# Patient Record
Sex: Female | Born: 1980 | Race: Black or African American | Hispanic: No | Smoking: Current every day smoker
Health system: Southern US, Community
[De-identification: ages and names within clinical notes are randomized; demographics above are authoritative.]

## PROBLEM LIST (undated history)

## (undated) ENCOUNTER — Inpatient Hospital Stay (HOSPITAL_COMMUNITY): Payer: Self-pay

## (undated) DIAGNOSIS — K219 Gastro-esophageal reflux disease without esophagitis: Secondary | ICD-10-CM

## (undated) DIAGNOSIS — I499 Cardiac arrhythmia, unspecified: Secondary | ICD-10-CM

## (undated) DIAGNOSIS — R51 Headache: Secondary | ICD-10-CM

## (undated) DIAGNOSIS — I10 Essential (primary) hypertension: Secondary | ICD-10-CM

## (undated) DIAGNOSIS — J45909 Unspecified asthma, uncomplicated: Secondary | ICD-10-CM

## (undated) DIAGNOSIS — F1120 Opioid dependence, uncomplicated: Secondary | ICD-10-CM

## (undated) HISTORY — PX: DILATION AND CURETTAGE OF UTERUS: SHX78

## (undated) HISTORY — DX: Opioid dependence, uncomplicated: F11.20

## (undated) HISTORY — PX: DENTAL SURGERY: SHX609

## (undated) HISTORY — PX: LAPAROSCOPY: SHX197

---
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
## 2002-03-15 ENCOUNTER — Other Ambulatory Visit: Admission: RE | Admit: 2002-03-15 | Discharge: 2002-03-15 | Payer: Self-pay | Admitting: Obstetrics and Gynecology

## 2002-04-19 ENCOUNTER — Emergency Department (HOSPITAL_COMMUNITY): Admission: EM | Admit: 2002-04-19 | Discharge: 2002-04-19 | Payer: Self-pay | Admitting: Emergency Medicine

## 2002-04-30 ENCOUNTER — Emergency Department (HOSPITAL_COMMUNITY): Admission: EM | Admit: 2002-04-30 | Discharge: 2002-04-30 | Payer: Self-pay | Admitting: Emergency Medicine

## 2002-08-09 ENCOUNTER — Inpatient Hospital Stay (HOSPITAL_COMMUNITY): Admission: AD | Admit: 2002-08-09 | Discharge: 2002-08-09 | Payer: Self-pay | Admitting: Obstetrics and Gynecology

## 2002-10-17 ENCOUNTER — Encounter: Payer: Self-pay | Admitting: Obstetrics and Gynecology

## 2002-10-17 ENCOUNTER — Ambulatory Visit (HOSPITAL_COMMUNITY): Admission: RE | Admit: 2002-10-17 | Discharge: 2002-10-17 | Payer: Self-pay | Admitting: Obstetrics and Gynecology

## 2002-11-14 ENCOUNTER — Encounter: Payer: Self-pay | Admitting: Obstetrics and Gynecology

## 2002-11-14 ENCOUNTER — Ambulatory Visit (HOSPITAL_COMMUNITY): Admission: RE | Admit: 2002-11-14 | Discharge: 2002-11-14 | Payer: Self-pay | Admitting: Obstetrics and Gynecology

## 2002-11-28 ENCOUNTER — Emergency Department (HOSPITAL_COMMUNITY): Admission: EM | Admit: 2002-11-28 | Discharge: 2002-11-28 | Payer: Self-pay | Admitting: Emergency Medicine

## 2003-03-07 ENCOUNTER — Inpatient Hospital Stay (HOSPITAL_COMMUNITY): Admission: AD | Admit: 2003-03-07 | Discharge: 2003-03-11 | Payer: Self-pay | Admitting: *Deleted

## 2003-03-08 ENCOUNTER — Encounter (INDEPENDENT_AMBULATORY_CARE_PROVIDER_SITE_OTHER): Payer: Self-pay

## 2003-04-18 ENCOUNTER — Other Ambulatory Visit: Admission: RE | Admit: 2003-04-18 | Discharge: 2003-04-18 | Payer: Self-pay | Admitting: Obstetrics and Gynecology

## 2003-09-14 ENCOUNTER — Inpatient Hospital Stay (HOSPITAL_COMMUNITY): Admission: AD | Admit: 2003-09-14 | Discharge: 2003-09-14 | Payer: Self-pay | Admitting: Obstetrics and Gynecology

## 2004-01-09 ENCOUNTER — Emergency Department (HOSPITAL_COMMUNITY): Admission: EM | Admit: 2004-01-09 | Discharge: 2004-01-09 | Payer: Self-pay | Admitting: Emergency Medicine

## 2004-01-15 ENCOUNTER — Emergency Department (HOSPITAL_COMMUNITY): Admission: EM | Admit: 2004-01-15 | Discharge: 2004-01-15 | Payer: Self-pay | Admitting: *Deleted

## 2004-10-07 ENCOUNTER — Emergency Department (HOSPITAL_COMMUNITY): Admission: EM | Admit: 2004-10-07 | Discharge: 2004-10-08 | Payer: Self-pay | Admitting: Emergency Medicine

## 2005-04-07 ENCOUNTER — Emergency Department (HOSPITAL_COMMUNITY): Admission: EM | Admit: 2005-04-07 | Discharge: 2005-04-07 | Payer: Self-pay | Admitting: Emergency Medicine

## 2005-06-25 ENCOUNTER — Ambulatory Visit: Payer: Self-pay | Admitting: *Deleted

## 2005-12-10 ENCOUNTER — Emergency Department (HOSPITAL_COMMUNITY): Admission: EM | Admit: 2005-12-10 | Discharge: 2005-12-10 | Payer: Self-pay | Admitting: *Deleted

## 2006-05-01 ENCOUNTER — Inpatient Hospital Stay (HOSPITAL_COMMUNITY): Admission: AD | Admit: 2006-05-01 | Discharge: 2006-05-01 | Payer: Self-pay | Admitting: Obstetrics & Gynecology

## 2006-08-29 ENCOUNTER — Inpatient Hospital Stay (HOSPITAL_COMMUNITY): Admission: AD | Admit: 2006-08-29 | Discharge: 2006-08-29 | Payer: Self-pay | Admitting: Obstetrics & Gynecology

## 2007-05-04 ENCOUNTER — Emergency Department (HOSPITAL_COMMUNITY): Admission: EM | Admit: 2007-05-04 | Discharge: 2007-05-04 | Payer: Self-pay | Admitting: Emergency Medicine

## 2007-10-13 ENCOUNTER — Encounter: Payer: Self-pay | Admitting: Obstetrics & Gynecology

## 2007-10-13 ENCOUNTER — Ambulatory Visit (HOSPITAL_COMMUNITY): Admission: RE | Admit: 2007-10-13 | Discharge: 2007-10-13 | Payer: Self-pay | Admitting: Obstetrics & Gynecology

## 2008-03-22 ENCOUNTER — Encounter: Admission: RE | Admit: 2008-03-22 | Discharge: 2008-03-22 | Payer: Self-pay | Admitting: Obstetrics & Gynecology

## 2008-09-06 ENCOUNTER — Emergency Department (HOSPITAL_COMMUNITY): Admission: EM | Admit: 2008-09-06 | Discharge: 2008-09-06 | Payer: Self-pay | Admitting: Emergency Medicine

## 2009-02-06 ENCOUNTER — Inpatient Hospital Stay (HOSPITAL_COMMUNITY): Admission: AD | Admit: 2009-02-06 | Discharge: 2009-02-06 | Payer: Self-pay | Admitting: Psychiatry

## 2009-05-02 ENCOUNTER — Emergency Department (HOSPITAL_COMMUNITY): Admission: EM | Admit: 2009-05-02 | Discharge: 2009-05-02 | Payer: Self-pay | Admitting: Emergency Medicine

## 2009-05-31 ENCOUNTER — Emergency Department (HOSPITAL_COMMUNITY): Admission: EM | Admit: 2009-05-31 | Discharge: 2009-05-31 | Payer: Self-pay | Admitting: Emergency Medicine

## 2009-10-11 ENCOUNTER — Ambulatory Visit (HOSPITAL_COMMUNITY): Admission: RE | Admit: 2009-10-11 | Discharge: 2009-10-11 | Payer: Self-pay | Admitting: Obstetrics & Gynecology

## 2009-10-25 ENCOUNTER — Ambulatory Visit (HOSPITAL_COMMUNITY): Admission: RE | Admit: 2009-10-25 | Discharge: 2009-10-25 | Payer: Self-pay | Admitting: Obstetrics & Gynecology

## 2009-11-02 ENCOUNTER — Emergency Department (HOSPITAL_COMMUNITY): Admission: EM | Admit: 2009-11-02 | Discharge: 2009-11-03 | Payer: Self-pay | Admitting: Emergency Medicine

## 2009-11-05 ENCOUNTER — Emergency Department (HOSPITAL_COMMUNITY): Admission: EM | Admit: 2009-11-05 | Discharge: 2009-11-05 | Payer: Self-pay | Admitting: Emergency Medicine

## 2009-11-14 ENCOUNTER — Inpatient Hospital Stay (HOSPITAL_COMMUNITY): Admission: AD | Admit: 2009-11-14 | Discharge: 2009-11-15 | Payer: Self-pay | Admitting: Obstetrics

## 2009-12-21 ENCOUNTER — Inpatient Hospital Stay (HOSPITAL_COMMUNITY): Admission: EM | Admit: 2009-12-21 | Discharge: 2009-12-23 | Payer: Self-pay | Admitting: Emergency Medicine

## 2009-12-23 ENCOUNTER — Encounter (INDEPENDENT_AMBULATORY_CARE_PROVIDER_SITE_OTHER): Payer: Self-pay | Admitting: Internal Medicine

## 2010-09-28 ENCOUNTER — Encounter: Payer: Self-pay | Admitting: Obstetrics and Gynecology

## 2010-10-19 ENCOUNTER — Emergency Department (HOSPITAL_COMMUNITY)
Admission: EM | Admit: 2010-10-19 | Discharge: 2010-10-19 | Disposition: A | Discharge status: Home / Self Care | Payer: Medicaid Other | Attending: Emergency Medicine | Admitting: Emergency Medicine

## 2010-10-19 DIAGNOSIS — I10 Essential (primary) hypertension: Secondary | ICD-10-CM | POA: Insufficient documentation

## 2010-10-19 DIAGNOSIS — J3489 Other specified disorders of nose and nasal sinuses: Secondary | ICD-10-CM | POA: Insufficient documentation

## 2010-10-19 DIAGNOSIS — IMO0001 Reserved for inherently not codable concepts without codable children: Secondary | ICD-10-CM | POA: Insufficient documentation

## 2010-10-19 DIAGNOSIS — R05 Cough: Secondary | ICD-10-CM | POA: Insufficient documentation

## 2010-10-19 DIAGNOSIS — R059 Cough, unspecified: Secondary | ICD-10-CM | POA: Insufficient documentation

## 2010-10-19 DIAGNOSIS — J45909 Unspecified asthma, uncomplicated: Secondary | ICD-10-CM | POA: Insufficient documentation

## 2010-10-19 DIAGNOSIS — J069 Acute upper respiratory infection, unspecified: Secondary | ICD-10-CM | POA: Insufficient documentation

## 2010-10-19 DIAGNOSIS — J029 Acute pharyngitis, unspecified: Secondary | ICD-10-CM | POA: Insufficient documentation

## 2010-11-05 ENCOUNTER — Emergency Department (HOSPITAL_COMMUNITY)
Admission: EM | Admit: 2010-11-05 | Discharge: 2010-11-05 | Disposition: A | Discharge status: Home / Self Care | Payer: Medicaid Other | Attending: Emergency Medicine | Admitting: Emergency Medicine

## 2010-11-05 DIAGNOSIS — L02219 Cutaneous abscess of trunk, unspecified: Secondary | ICD-10-CM | POA: Insufficient documentation

## 2010-11-05 DIAGNOSIS — I10 Essential (primary) hypertension: Secondary | ICD-10-CM | POA: Insufficient documentation

## 2010-11-05 DIAGNOSIS — R51 Headache: Secondary | ICD-10-CM | POA: Insufficient documentation

## 2010-11-08 LAB — WOUND CULTURE: Gram Stain: NONE SEEN

## 2010-11-24 ENCOUNTER — Emergency Department (HOSPITAL_COMMUNITY): Payer: Medicaid Other

## 2010-11-24 ENCOUNTER — Emergency Department (HOSPITAL_COMMUNITY)
Admission: EM | Admit: 2010-11-24 | Discharge: 2010-11-24 | Disposition: A | Discharge status: Home / Self Care | Payer: Medicaid Other | Attending: Emergency Medicine | Admitting: Emergency Medicine

## 2010-11-24 DIAGNOSIS — I10 Essential (primary) hypertension: Secondary | ICD-10-CM | POA: Insufficient documentation

## 2010-11-24 DIAGNOSIS — R05 Cough: Secondary | ICD-10-CM | POA: Insufficient documentation

## 2010-11-24 DIAGNOSIS — R0602 Shortness of breath: Secondary | ICD-10-CM | POA: Insufficient documentation

## 2010-11-24 DIAGNOSIS — R059 Cough, unspecified: Secondary | ICD-10-CM | POA: Insufficient documentation

## 2010-11-24 DIAGNOSIS — R002 Palpitations: Secondary | ICD-10-CM | POA: Insufficient documentation

## 2010-11-24 DIAGNOSIS — T50901A Poisoning by unspecified drugs, medicaments and biological substances, accidental (unintentional), initial encounter: Secondary | ICD-10-CM | POA: Insufficient documentation

## 2010-11-24 LAB — URINALYSIS, ROUTINE W REFLEX MICROSCOPIC
Hgb urine dipstick: NEGATIVE
Ketones, ur: NEGATIVE mg/dL
Nitrite: NEGATIVE
Specific Gravity, Urine: 1.022 (ref 1.005–1.030)
pH: 7 (ref 5.0–8.0)

## 2010-11-24 LAB — DIFFERENTIAL
Basophils Relative: 0 % (ref 0–1)
Lymphs Abs: 2.5 10*3/uL (ref 0.7–4.0)
Monocytes Absolute: 0.6 10*3/uL (ref 0.1–1.0)
Monocytes Relative: 7 % (ref 3–12)
Neutro Abs: 5.4 10*3/uL (ref 1.7–7.7)
Neutrophils Relative %: 63 % (ref 43–77)

## 2010-11-24 LAB — CBC
HCT: 38.9 % (ref 36.0–46.0)
Hemoglobin: 12.8 g/dL (ref 12.0–15.0)
MCV: 92.6 fL (ref 78.0–100.0)
WBC: 8.6 10*3/uL (ref 4.0–10.5)

## 2010-11-24 LAB — BASIC METABOLIC PANEL
BUN: 14 mg/dL (ref 6–23)
CO2: 28 mEq/L (ref 19–32)
Chloride: 105 mEq/L (ref 96–112)
Creatinine, Ser: 1.03 mg/dL (ref 0.4–1.2)
GFR calc Af Amer: >60 mL/min (ref >60)
Sodium: 140 mEq/L (ref 135–145)

## 2010-11-25 LAB — RAPID URINE DRUG SCREEN, HOSP PERFORMED
Amphetamines: NOT DETECTED
Benzodiazepines: NOT DETECTED
Cocaine: NOT DETECTED
Opiates: NOT DETECTED
Tetrahydrocannabinol: NOT DETECTED

## 2010-11-25 LAB — CK TOTAL AND CKMB (NOT AT ARMC)
CK, MB: 1.4 ng/mL (ref 0.3–4.0)
Total CK: 194 U/L — ABNORMAL HIGH (ref 7–177)

## 2010-11-25 LAB — POCT I-STAT, CHEM 8
BUN: 11 mg/dL (ref 6–23)
Calcium, Ion: 1.16 mmol/L (ref 1.12–1.32)
Chloride: 107 mEq/L (ref 96–112)
Glucose, Bld: 92 mg/dL (ref 70–99)
Potassium: 4.8 mEq/L (ref 3.5–5.1)
Sodium: 141 mEq/L (ref 135–145)
TCO2: 27 mmol/L (ref 0–100)

## 2010-11-25 LAB — URINALYSIS, ROUTINE W REFLEX MICROSCOPIC
Glucose, UA: NEGATIVE mg/dL
Protein, ur: NEGATIVE mg/dL
pH: 6.5 (ref 5.0–8.0)

## 2010-11-25 LAB — CORTISOL-AM, BLOOD: Cortisol - AM: 3.9 ug/dL — ABNORMAL LOW (ref 4.3–22.4)

## 2010-11-25 LAB — BASIC METABOLIC PANEL
BUN: 11 mg/dL (ref 6–23)
BUN: 7 mg/dL (ref 6–23)
CO2: 28 mEq/L (ref 19–32)
Calcium: 10 mg/dL (ref 8.4–10.5)
Chloride: 106 mEq/L (ref 96–112)
Chloride: 108 mEq/L (ref 96–112)
Creatinine, Ser: 0.79 mg/dL (ref 0.4–1.2)
GFR calc Af Amer: >60 mL/min (ref >60)
GFR calc non Af Amer: >60 mL/min (ref >60)
Potassium: 3.4 mEq/L — ABNORMAL LOW (ref 3.5–5.1)

## 2010-11-25 LAB — CARDIAC PANEL(CRET KIN+CKTOT+MB+TROPI)
Relative Index: 0.7 (ref 0.0–2.5)
Relative Index: 0.8 (ref 0.0–2.5)
Troponin I: <0.01 ng/mL (ref 0.00–0.06)

## 2010-11-25 LAB — POCT CARDIAC MARKERS
Myoglobin, poc: 56.3 ng/mL (ref 12–200)
Myoglobin, poc: 77.1 ng/mL (ref 12–200)
Troponin i, poc: <0.05 ng/mL (ref 0.00–0.09)

## 2010-11-25 LAB — TSH
TSH: 0.333 u[IU]/mL — ABNORMAL LOW (ref 0.350–4.500)
TSH: 0.427 u[IU]/mL (ref 0.350–4.500)

## 2010-11-25 LAB — URINE CULTURE

## 2010-11-25 LAB — CBC
HCT: 38.4 % (ref 36.0–46.0)
MCHC: 33.5 g/dL (ref 30.0–36.0)
MCV: 95.2 fL (ref 78.0–100.0)
Platelets: 258 10*3/uL (ref 150–400)
WBC: 11.2 10*3/uL — ABNORMAL HIGH (ref 4.0–10.5)

## 2010-11-25 LAB — PROTIME-INR: Prothrombin Time: 13 seconds (ref 11.6–15.2)

## 2010-11-25 LAB — LIPID PANEL
Cholesterol: 148 mg/dL (ref 0–200)
LDL Cholesterol: 106 mg/dL — ABNORMAL HIGH (ref 0–99)

## 2010-11-25 LAB — HEMOGLOBIN A1C: Hgb A1c MFr Bld: 5.4 % (ref <5.7)

## 2010-11-26 ENCOUNTER — Inpatient Hospital Stay (INDEPENDENT_AMBULATORY_CARE_PROVIDER_SITE_OTHER)
Admission: RE | Admit: 2010-11-26 | Discharge: 2010-11-26 | Disposition: A | Discharge status: Home / Self Care | Payer: Medicaid Other | Source: Ambulatory Visit

## 2010-11-26 DIAGNOSIS — J45909 Unspecified asthma, uncomplicated: Secondary | ICD-10-CM

## 2010-11-26 DIAGNOSIS — R0989 Other specified symptoms and signs involving the circulatory and respiratory systems: Secondary | ICD-10-CM

## 2010-11-26 LAB — URINALYSIS, ROUTINE W REFLEX MICROSCOPIC
Glucose, UA: NEGATIVE mg/dL
Protein, ur: NEGATIVE mg/dL
Urobilinogen, UA: 1 mg/dL (ref 0.0–1.0)

## 2010-11-26 LAB — URINE MICROSCOPIC-ADD ON

## 2010-11-26 LAB — CBC
HCT: 40.5 % (ref 36.0–46.0)
Hemoglobin: 13.3 g/dL (ref 12.0–15.0)
MCHC: 32.9 g/dL (ref 30.0–36.0)
RDW: 13.4 % (ref 11.5–15.5)

## 2010-11-28 ENCOUNTER — Emergency Department (HOSPITAL_COMMUNITY)
Admission: EM | Admit: 2010-11-28 | Discharge: 2010-11-28 | Disposition: A | Discharge status: Home / Self Care | Payer: Medicaid Other | Attending: Emergency Medicine | Admitting: Emergency Medicine

## 2010-11-28 ENCOUNTER — Emergency Department (HOSPITAL_COMMUNITY): Payer: Medicaid Other

## 2010-11-28 DIAGNOSIS — I10 Essential (primary) hypertension: Secondary | ICD-10-CM | POA: Insufficient documentation

## 2010-11-28 DIAGNOSIS — Z79899 Other long term (current) drug therapy: Secondary | ICD-10-CM | POA: Insufficient documentation

## 2010-11-28 DIAGNOSIS — R0609 Other forms of dyspnea: Secondary | ICD-10-CM | POA: Insufficient documentation

## 2010-11-28 DIAGNOSIS — R0602 Shortness of breath: Secondary | ICD-10-CM | POA: Insufficient documentation

## 2010-11-28 DIAGNOSIS — K219 Gastro-esophageal reflux disease without esophagitis: Secondary | ICD-10-CM | POA: Insufficient documentation

## 2010-11-28 DIAGNOSIS — R1013 Epigastric pain: Secondary | ICD-10-CM | POA: Insufficient documentation

## 2010-11-28 DIAGNOSIS — R0989 Other specified symptoms and signs involving the circulatory and respiratory systems: Secondary | ICD-10-CM | POA: Insufficient documentation

## 2010-11-28 DIAGNOSIS — J45909 Unspecified asthma, uncomplicated: Secondary | ICD-10-CM | POA: Insufficient documentation

## 2010-11-28 DIAGNOSIS — R079 Chest pain, unspecified: Secondary | ICD-10-CM | POA: Insufficient documentation

## 2010-11-28 LAB — POCT I-STAT, CHEM 8
Calcium, Ion: 1.16 mmol/L (ref 1.12–1.32)
Creatinine, Ser: 0.8 mg/dL (ref 0.4–1.2)
Glucose, Bld: 94 mg/dL (ref 70–99)
Hemoglobin: 14.3 g/dL (ref 12.0–15.0)
Potassium: 6.2 mEq/L — ABNORMAL HIGH (ref 3.5–5.1)
TCO2: 29 mmol/L (ref 0–100)

## 2010-11-28 LAB — DIFFERENTIAL
Basophils Absolute: 0 10*3/uL (ref 0.0–0.1)
Basophils Relative: 1 % (ref 0–1)
Eosinophils Absolute: 0.2 10*3/uL (ref 0.0–0.7)
Neutrophils Relative %: 66 % (ref 43–77)

## 2010-11-28 LAB — CBC
MCHC: 34.1 g/dL (ref 30.0–36.0)
MCV: 94.7 fL (ref 78.0–100.0)
Platelets: 230 10*3/uL (ref 150–400)

## 2010-11-28 LAB — URINE MICROSCOPIC-ADD ON

## 2010-11-28 LAB — HCG, QUANTITATIVE, PREGNANCY: hCG, Beta Chain, Quant, S: 39 m[IU]/mL — ABNORMAL HIGH (ref <5)

## 2010-11-28 LAB — URINALYSIS, ROUTINE W REFLEX MICROSCOPIC
Bilirubin Urine: NEGATIVE
Glucose, UA: NEGATIVE mg/dL
Ketones, ur: NEGATIVE mg/dL
pH: 6.5 (ref 5.0–8.0)

## 2010-11-28 LAB — POTASSIUM: Potassium: 5.1 mEq/L (ref 3.5–5.1)

## 2010-11-30 LAB — GC/CHLAMYDIA PROBE AMP, GENITAL: Chlamydia, DNA Probe: NEGATIVE

## 2010-11-30 LAB — CBC
HCT: 34.2 % — ABNORMAL LOW (ref 36.0–46.0)
Hemoglobin: 12.7 g/dL (ref 12.0–15.0)
MCHC: 32.3 g/dL (ref 30.0–36.0)
MCV: 96.6 fL (ref 78.0–100.0)
Platelets: 213 10*3/uL (ref 150–400)
Platelets: 231 10*3/uL (ref 150–400)
RDW: 13.7 % (ref 11.5–15.5)
RDW: 13.7 % (ref 11.5–15.5)

## 2010-11-30 LAB — WET PREP, GENITAL
Clue Cells Wet Prep HPF POC: NONE SEEN
Trich, Wet Prep: NONE SEEN

## 2010-11-30 LAB — ABO/RH: ABO/RH(D): O POS

## 2010-11-30 LAB — CREATININE, SERUM: Creatinine, Ser: 0.8 mg/dL (ref 0.4–1.2)

## 2010-12-12 LAB — DIFFERENTIAL
Eosinophils Relative: 2 % (ref 0–5)
Lymphocytes Relative: 23 % (ref 12–46)
Lymphs Abs: 2.8 10*3/uL (ref 0.7–4.0)
Monocytes Absolute: 0.8 10*3/uL (ref 0.1–1.0)

## 2010-12-12 LAB — COMPREHENSIVE METABOLIC PANEL
AST: 20 U/L (ref 0–37)
Albumin: 3.9 g/dL (ref 3.5–5.2)
Calcium: 9.4 mg/dL (ref 8.4–10.5)
Creatinine, Ser: 0.85 mg/dL (ref 0.4–1.2)
GFR calc Af Amer: >60 mL/min (ref >60)
Total Protein: 7.1 g/dL (ref 6.0–8.3)

## 2010-12-12 LAB — GC/CHLAMYDIA PROBE AMP, GENITAL: Chlamydia, DNA Probe: NEGATIVE

## 2010-12-12 LAB — CBC
MCHC: 33.7 g/dL (ref 30.0–36.0)
MCV: 93.7 fL (ref 78.0–100.0)
Platelets: 210 10*3/uL (ref 150–400)
WBC: 11.8 10*3/uL — ABNORMAL HIGH (ref 4.0–10.5)

## 2010-12-12 LAB — URINE CULTURE

## 2010-12-12 LAB — URINALYSIS, ROUTINE W REFLEX MICROSCOPIC
Bilirubin Urine: NEGATIVE
Glucose, UA: NEGATIVE mg/dL
Ketones, ur: NEGATIVE mg/dL
Protein, ur: NEGATIVE mg/dL

## 2010-12-12 LAB — WET PREP, GENITAL

## 2010-12-12 LAB — POCT PREGNANCY, URINE: Preg Test, Ur: NEGATIVE

## 2010-12-13 LAB — URINALYSIS, ROUTINE W REFLEX MICROSCOPIC
Nitrite: NEGATIVE
Protein, ur: NEGATIVE mg/dL
Urobilinogen, UA: 1 mg/dL (ref 0.0–1.0)

## 2010-12-13 LAB — POCT I-STAT, CHEM 8
HCT: 39 % (ref 36.0–46.0)
Hemoglobin: 13.3 g/dL (ref 12.0–15.0)
Potassium: 4.3 mEq/L (ref 3.5–5.1)
Sodium: 141 mEq/L (ref 135–145)

## 2010-12-13 LAB — GC/CHLAMYDIA PROBE AMP, GENITAL: GC Probe Amp, Genital: NEGATIVE

## 2010-12-13 LAB — CBC
MCHC: 34 g/dL (ref 30.0–36.0)
MCV: 92.6 fL (ref 78.0–100.0)
Platelets: 193 10*3/uL (ref 150–400)
WBC: 9.1 10*3/uL (ref 4.0–10.5)

## 2010-12-13 LAB — DIFFERENTIAL
Basophils Relative: 0 % (ref 0–1)
Eosinophils Absolute: 0.2 10*3/uL (ref 0.0–0.7)
Lymphs Abs: 2.6 10*3/uL (ref 0.7–4.0)
Neutrophils Relative %: 61 % (ref 43–77)

## 2010-12-13 LAB — URINE MICROSCOPIC-ADD ON

## 2010-12-13 LAB — WET PREP, GENITAL: Yeast Wet Prep HPF POC: NONE SEEN

## 2010-12-13 LAB — PREGNANCY, URINE: Preg Test, Ur: NEGATIVE

## 2010-12-15 LAB — CBC
HCT: 36 % (ref 36.0–46.0)
Hemoglobin: 12.4 g/dL (ref 12.0–15.0)
MCHC: 34.3 g/dL (ref 30.0–36.0)
RDW: 13.7 % (ref 11.5–15.5)

## 2010-12-15 LAB — URINALYSIS, ROUTINE W REFLEX MICROSCOPIC
Nitrite: NEGATIVE
Specific Gravity, Urine: 1.02 (ref 1.005–1.030)
Urobilinogen, UA: 0.2 mg/dL (ref 0.0–1.0)

## 2010-12-15 LAB — GC/CHLAMYDIA PROBE AMP, GENITAL
Chlamydia, DNA Probe: NEGATIVE
GC Probe Amp, Genital: NEGATIVE

## 2011-01-20 NOTE — H&P (Signed)
Sydney Griffin, Sydney Griffin             ACCOUNT NO.:  [id number]   MEDICAL RECORD NO.:  [id number]          PATIENT TYPE:  AMB   LOCATION:  SDC                           [facility]   PHYSICIAN:  Roseanna Rainbow, M.D.DATE OF BIRTH:  1981/08/04   DATE OF ADMISSION:  DATE OF DISCHARGE:                              HISTORY & PHYSICAL   CHIEF COMPLAINT:  The patient is a 30 year old with chronic pelvic pain  who presents for a diagnostic laparoscopy.   HISTORY OF PRESENT ILLNESS:  The patient reports lower abdominal pain  that is chronic in nature with associated bloating.  The patient has  been referred to physical therapy and to orthopedic surgery.  She also  has dysmenorrhea.  Her complaints have been refractory to attempts to  manage with a combination oral contraceptive.   SOCIAL HISTORY:  She is single, living with a significant other.  She is  unemployed.  There is a history of tobacco use.  She does not give any  significant history of alcohol usage.   FAMILY HISTORY:  Hypertension, diabetes.   PAST GYNECOLOGIC HISTORY:  Please see the above.  She reports heavy  menses.   PAST OBSTETRICAL HISTORY:  There is a history of a previous cesarean  delivery.   ALLERGIES:  No known drug allergies.   MEDICATIONS:  Please see the medication reconciliation form.   REVIEW OF SYSTEMS:  GENITOURINARY:  Please see the above.   PHYSICAL EXAMINATION:  VITAL SIGNS:  Temperature 98.2, pulse 76, blood  pressure 128/88.  Weight 327 pounds.  GENERAL:  Obese African-American female in no apparent distress.  ABDOMEN:  No organomegaly.  Nontender.  PELVIC:  Normal external female genitalia.  On speculum exam, the vagina  is clean.  Cervix is without lesions.  Bimanual exam is limited by the  patient's body habitus; however, the posterior cul-de-sac is nontender,  the adnexa are nontender.   ASSESSMENT:  Chronic pelvic pain.  Rule out endometriosis.   PLANNED PROCEDURE:  Diagnostic  laparoscopy with possible biopsy,  possible fulguration of any endometriotic lesions.  The risks, benefits,  and alternative forms of management were reviewed with the patient, and  informed consent has been obtained.      Roseanna Rainbow, M.D.  Electronically Signed     LAJ/MEDQ  D:  10/12/2007  T:  10/12/2007  Job:  [id number]

## 2011-01-20 NOTE — Op Note (Signed)
Sydney Griffin, Sydney Griffin             ACCOUNT NO.:  [id number]   MEDICAL RECORD NO.:  [id number]          PATIENT TYPE:  AMB   LOCATION:  SDC                           [facility]   PHYSICIAN:  Roseanna Rainbow, M.D.DATE OF BIRTH:  1980-12-06   DATE OF PROCEDURE:  10/13/2007  DATE OF DISCHARGE:                               OPERATIVE REPORT   PREOPERATIVE DIAGNOSIS:  Chronic pelvic pain.   POSTOPERATIVE DIAGNOSIS:  Chronic pelvic pain.   PROCEDURE:  Diagnostic laparoscopy.   SURGEON:  Cyd Silence.   ANESTHESIA:  General endotracheal.   ESTIMATED BLOOD LOSS:  Minimal.   COMPLICATIONS:  None.   PATHOLOGY:  Hemorrhagic lesion emanating from the left ovarian cortex   INTRAVENOUS FLUIDS AND URINE OUTPUT:  As per anesthesiology.   FINDINGS:  There was a 1- to 2-cm hemorrhagic lesion emanating from the  distal dependent portion of the left ovarian cortex.  It was exophytic  in nature.  There were filmy adhesions involving the sigmoid colon to  the left pelvic sidewall.  The remainder of the pelvic anatomy was  normal appearing.   PROCEDURE:  The patient was taken to the operating room with an IV  running.  She was given general anesthesia and placed in the dorsal  lithotomy position and prepped and draped in the usual sterile fashion.  After time-out had been completed, a bivalve speculum was placed in the  patient's vagina.  The anterior lip of the cervix was grasped with a  single-tooth tenaculum.  A Hulka manipulator was then advanced into the  uterus and secured to the anterior lip of the cervix as a means to  manipulate the uterus.  The single-tooth tenaculum and speculum were  then removed.  An infraumbilical skin incision was then made with a  scalpel.  This was carried down sharply to the fascia and posterior  rectus sheath.  The parietal peritoneum was tented up and entered  sharply as well.  The Hasson trocar was then advanced into the abdomen.  The  abdomen was then insufflated with CO2 gas.  A 5-mm trocar and sleeve  were then advanced into the peritoneal cavity.  This port was placed  suprapubically.  A blunt probe was then used to manipulate the pelvic  viscera.  The above findings were noted.  The exophytic hemorrhagic  lesion was then grasped with a grasper and removed.  All the instruments  were then removed from the abdomen.  The fascial incision at the  umbilicus was closed with a running suture of 0 Vicryl.  The skin  incisions were then closed with Dermabond.  The  Hulka manipulator was then removed from the vagina with minimal bleeding  noted from the cervix.  At the close of the procedure, the instrument  and pack counts were said to be correct x2.  The patient was taken to  the PACU awake and in stable condition.      Roseanna Rainbow, M.D.  Electronically Signed     LAJ/MEDQ  D:  10/13/2007  T:  10/14/2007  Job:  [id number]

## 2011-01-23 NOTE — H&P (Signed)
Sydney Griffin, Sydney Griffin                       ACCOUNT NO.:  [id number]   MEDICAL RECORD NO.:  [id number]                   PATIENT TYPE:  INP   LOCATION:  9162                                 [facility]   PHYSICIAN:  Naima A. Dillard, M.D.              DATE OF BIRTH:  Oct 05, 1980   DATE OF ADMISSION:  03/07/2003  DATE OF DISCHARGE:                                HISTORY & PHYSICAL   HISTORY OF PRESENT ILLNESS:  The patient is a 30 year old gravida 3, para 0-  0-2-0 at 41-2/7 weeks who presents for induction of labor secondary to  postdates.  Her blood pressure on admission was 112/78 but she did have a  single blood pressure of 152/106 and 30 mg of protein on a voided specimen.  Her labs were within normal limits.  She was admitted for Cytotec during the  night followed by potassium in the morning.  Her pregnancy has been followed  by Dr. Pennie Rushing and remarkable for (1) obesity, (2) history of STDs, (3)  unsure dates, (4) low social support, (5) history of asthma, (6) size  greater than dates, (7) group B strep positive.   PAST OBSTETRICAL HISTORY:  OB history is remarkable for spontaneous  miscarriages in 2001 and 2002 at 6 weeks.   PAST MEDICAL HISTORY:  Medical history is remarkable for conceptions on oral  contraceptives, she also has a history of Chlamydia, gonorrhea, and  Trichomonas, history of childhood asthma.   FAMILY HISTORY:  Family history is remarkable for her father with diabetes  on p.o. meds, maternal first cousin with thyroid dysfunction, maternal  grandmother and grandfather with renal failure, maternal aunt with stroke,  and paternal grandmother with Alzheimer's disease, brothers and sisters and  the patient all smoke.   GENETIC HISTORY:  Genetic history is remarkable for the patient's brother  who was born with a hole in his heart, the patient's cousin with  questionable Down syndrome and the patient's brothers who are twins.   SOCIAL HISTORY:  The  patient is single.  The father of the baby is not  involved.  She is of the [employer].  She denies any  alcohol or drug abuse but does smoke cigarettes.   OBJECTIVE DATA:  VITAL SIGNS:  Stable, recent blood pressures have been 135-  149/70-98, afebrile.  HEENT:  Within normal limits.  NECK:  Thyroid normal, not enlarged.  CHEST:  Clear to auscultation.  HEART:  Regular rate and rhythm.  ABDOMEN:  Gravid, vertex to Leopold's; fetal heart rate shows a reassuring  pattern initially.  CERVIX:  Two centimeters, 80% effaced, -3 station, vertex presentation well-  applied to the cervix.  EXTREMITIES:  Within normal limits.   ASSESSMENT:  1. Intrauterine pregnancy at 41 weeks.  2. Group B streptococci positive.  3. Meconium fluid after amniotomy.  4. Obesity.  5. Mildly elevated blood pressure with proteinuria on a voided specimen.  PLAN:  1. Admit to birthing suite.  2. Routine MD orders.  3. Induction of labor.  4. Amnioinfusion as needed.  5. Monitor blood pressure with catheter urine if blood pressure elevates     over 150/90.  6. Group B strep prophylaxis.  7. Further orders to follow.     Marie L. Williams, C.N.M.                 Naima A. Normand Sloop, M.D.    MLW/MEDQ  D:  03/08/2003  T:  03/08/2003  Job:  [id number]

## 2011-01-23 NOTE — Op Note (Signed)
Sydney Griffin, Sydney Griffin                       ACCOUNT NO.:  [id number]   MEDICAL RECORD NO.:  [id number]                   PATIENT TYPE:  INP   LOCATION:  9128                                 [facility]   PHYSICIAN:  Hal Morales, M.D.             DATE OF BIRTH:  Jun 23, 1981   DATE OF PROCEDURE:  03/08/2003  DATE OF DISCHARGE:                                 OPERATIVE REPORT   PREOPERATIVE DIAGNOSES:  1. Intrauterine pregnancy at 41-1/2 weeks' gestation.  2. Meconium-stained amniotic fluid.  3. Failure to progress in labor.  4. Nonreassuring fetal heart rate tracing.   POSTOPERATIVE DIAGNOSES:  1. Intrauterine pregnancy at 41-1/2 weeks' gestation.  2. Meconium-stained amniotic fluid.  3. Failure to progress in labor.  4. Nonreassuring fetal heart rate tracing.  5. Occult cord.   OPERATION:  Primary low transverse cesarean section.   SURGEON:  Hal Morales, M.D.   FIRST ASSISTANT:  Marie L. Williams, C.N.M.   ANESTHESIA:  Epidural.   ESTIMATED BLOOD LOSS:  750 mL.   COMPLICATIONS:  None.   FINDINGS:  The patient was delivered of a female infant whose name is  Nikayla, with Apgars of 1 and 9 at one and five minutes, respectively.  The  cord pH was 7.01.  The weight was pending at the time of dictation.  The  uterus, tubes, and ovaries were normal for the gravid state.   PROCEDURE:  A discussion was held with the patient after evaluation  concerning her lack of progression in spite of adequate labor, meconium-  stained amniotic fluid, and variable decelerations in spite of prolonged  amnioinfusion.  It was thus recommended that she undergo cesarean sections.  The risks of anesthesia, bleeding, infection, and damage to adjacent organs  were explained, and the patient consented.  She was taken to the operating  room with her labor epidural and Foley catheter in place.  She was placed  the operating table in the supine position with a left lateral tilt.   She  was dosed for surgical anesthesia.  The abdomen was prepped with multiple  layers of Betadine and draped as a sterile field.  After assurance of  adequate anesthesia, the suprapubic region was infiltrated with 10 mL of  0.25% Marcaine.  A suprapubic incision was made and the abdomen opened in  layers.  The peritoneum was entered and the bladder blade placed.  The  uterus was incised approximately 2 cm above the uterovesical fold and the  infant was delivered from the occiput transverse position and after having  the nares and pharynx suctioned with DeLee catheter, the remainder of the  infant was delivered.  The cord was clamped and cut and the infant handed  off to the awaiting pediatricians.  The appropriate cord blood was drawn and  the placenta noted to have separated from the uterus and was removed from  the operative field.  The uterine  incision was closed with a running  interlocking suture of 0 Vicryl.  An imbricating suture of 0 Vicryl was then  placed.  Hemostasis was adequate and copious irrigation carried out.  The  abdominal peritoneum was closed with a running suture of 2-0 Vicryl.  The  rectus muscles were reapproximated in the midline with a figure-of-eight  suture of 2-0 Vicryl.  The rectus fascia was closed with a running suture of  0 Vicryl, then reinforced on either side of midline with figure-of-eight  sutures of 0 Vicryl.  The subcutaneous tissue was irrigated and made  hemostatic with Bovie cautery.  A 7 mm Jackson-Pratt drain was the left in  the subcu space and exited through a stab wound in the left upper incision  area.  The incision was closed with skin staples and a sterile dressing  applied.  The patient was taken from the operating room to the recovery room  in satisfactory condition, having tolerated the procedure well, with sponge  and instrument counts correct.  The infant went to the full-term nursery.  The placenta went to pathology.                                                Hal Morales, M.D.    VPH/MEDQ  D:  03/08/2003  T:  03/09/2003  Job:  [id number]

## 2011-01-23 NOTE — Discharge Summary (Signed)
Sydney, Griffin                       ACCOUNT NO.:  [id number]   MEDICAL RECORD NO.:  [id number]                   PATIENT TYPE:  INP   LOCATION:  9128                                 [facility]   PHYSICIAN:  Naima A. Dillard, M.D.              DATE OF BIRTH:  07-04-1981   DATE OF ADMISSION:  03/07/2003  DATE OF DISCHARGE:  03/11/2003                                 DISCHARGE SUMMARY   ADMITTING DIAGNOSES:  1. Intrauterine pregnancy at 41 weeks.  2. Positive group B Streptococcus.  3. Meconium-stained amniotic fluid.  4. Obesity.  5. Mildly elevated blood pressure with proteinuria.   HISTORY OF PRESENT ILLNESS:  [name] is a 30 year old, G3, P0, who  presented at 42 and 2/7ths weeks for induction of labor secondary to post-  dates.  Her blood pressure on admission was 112/78.  She had blood pressures  from 130's-150's/70's-106.  Her blood pressure did remain stable through  labor, and urine protein on a cath urine specimen was found to be negative.  The patient's labor was induced with Cytotec and artificial rupture of  membranes.  The patient was then found to have meconium-stained amniotic  fluid and an amnioinfusion was begun, and fetal heart rate began to have  variable decelerations.  Her cervix remained at 3 cm, and in light of  continued variables in spite of amnioinfusion, meconium-stained amniotic  fluid, and lack of cervical progress, cesarean section was recommended by  Dr. Hal Morales, and accepted by the patient, which was performed at  1847 on 03/08/03 by Dr. Dierdre Forth with the birth of a 7 pound, 2 ounce  female infant names Sydney Griffin.  The infant had Apgar scores of 1 at one minute  and 9 at five minutes.  Cord pH was 7.01.  Both patient and infant have done  well in the postoperative period.  The baby is bottle feeding.  The  patient's vital signs have remained stable.  She had a single increased  blood pressure of 170/103 and received  Labetalol.  Otherwise her vital signs  and blood pressure have remained stable.  It has ranged from the 120's-  160's/70's-90's, and she has not required any further blood pressure  medication.  She has remained afebrile.  Her postoperative weight was 300  down from 305.  Her hemoglobin on the first postoperative day was 10.1.  Her  incision was clean and intact.  She had the JP drain which is draining  minimal fluid, and on this, her third postoperative day, she is deemed to be  in satisfactory condition for discharge.   DISCHARGE INSTRUCTIONS:  Discharge postoperative instruction sheet given to  patient.   DISCHARGE MEDICATIONS:  1. Motrin 600 mg p.o. q.6h. p.r.n. pain.  2. Tylox 1-2 p.o. q.3-4h. p.r.n. pain.  3. Ortho Tri-Cyclen for contraception.  4. Prenatal vitamins.   FOLLOW UP:  The patient will follow up  in the office of [facility] in six weeks.     Rica Koyanagi, C.N.M.               Naima A. Normand Sloop, M.D.    SDM/MEDQ  D:  03/11/2003  T:  03/11/2003  Job:  [id number]

## 2011-01-23 NOTE — Group Therapy Note (Signed)
NAMENAKEITA, Sydney Griffin NO.:  [id number]   MEDICAL RECORD NO.:  [id number]          PATIENT TYPE:  WOC   LOCATION:  [facility]                   FACILITY:  [facility]   PHYSICIAN:  Carolanne Grumbling, M.D.   DATE OF BIRTH:  1981/04/30   DATE OF SERVICE:                                    CLINIC NOTE   HISTORY OF PRESENT ILLNESS:  A 30 year old G1, P1 who presents for Pap.  No  history of abnormal.  Last one was in August of 2004.  She reports regular  periods.  LMP was on June 06, 2005.  She has been having unprotected  sex with her boyfriend for the last 2 years, and has been unable to get  pregnant.  She is still wanting to get pregnant, but at the same time, she  says that she wants to be on birth control.  Denies any history of blood  clots.  However, she does smoke 1 pack per day for the last 5 years.   PAST MEDICAL HISTORY:  Asthma.   MEDICATIONS:  None.   ALLERGIES:  No known drug allergies.   PHYSICAL EXAMINATION:  GENERAL APPEARANCE:  Well-developed, well-nourished,  pleasant female in no apparent distress.  VITAL SIGNS:  Blood pressure 152/102.  On repeat, it was 148/94.  Pulse is  86.  Weight 327 pounds, height 5 feet 8 inches.  CV:  Regular rate and rhythm.  LUNGS:  Clear to auscultation bilaterally.  ABDOMEN:  Obese, nontender, nondistended.  Positive bowel sounds.  GU:  Normal external genitalia except she did have a little bit of  excoriation that she attributed to using a different type of soap recently.  The vagina has pink rugae.  Cervix with no lesions.  Exam was somewhat  limited by her body habitus.  Thin prep Pap was done.  GC and chlamydia were  done.   IMPRESSION:  1.  Well woman exam.  2.  Elevated blood pressure.   PLAN:  We will not start the patient on birth control today as she has an  elevated blood pressure, smokes, and is overweight.  I explained that we do  not diagnose hypertension on one office visit.  She needs to go see a  primary care physician.  When she has that taken care of then she would  essentially be a candidate for birth control.  She is currently in the  process of being on Medicaid so then she will go back to her former PCP.          ______________________________  Carolanne Grumbling, M.D.    TW/MEDQ  D:  06/25/2005  T:  06/25/2005  Job:  [id number]

## 2011-03-19 ENCOUNTER — Inpatient Hospital Stay (HOSPITAL_COMMUNITY)
Admission: AD | Admit: 2011-03-19 | Discharge: 2011-03-19 | Discharge status: Against Medical Advice | Payer: Medicaid Other | Source: Ambulatory Visit | Attending: Obstetrics & Gynecology | Admitting: Obstetrics & Gynecology

## 2011-03-19 NOTE — Progress Notes (Signed)
Pt left AMA °

## 2011-03-21 ENCOUNTER — Inpatient Hospital Stay (HOSPITAL_COMMUNITY): Payer: Medicaid Other

## 2011-03-21 ENCOUNTER — Inpatient Hospital Stay (HOSPITAL_COMMUNITY)
Admission: AD | Admit: 2011-03-21 | Discharge: 2011-03-21 | Disposition: A | Discharge status: Home / Self Care | Payer: Medicaid Other | Source: Ambulatory Visit | Attending: Obstetrics | Admitting: Obstetrics

## 2011-03-21 ENCOUNTER — Encounter (HOSPITAL_COMMUNITY): Payer: Self-pay | Admitting: *Deleted

## 2011-03-21 DIAGNOSIS — R102 Pelvic and perineal pain: Secondary | ICD-10-CM

## 2011-03-21 DIAGNOSIS — N938 Other specified abnormal uterine and vaginal bleeding: Secondary | ICD-10-CM | POA: Insufficient documentation

## 2011-03-21 DIAGNOSIS — N949 Unspecified condition associated with female genital organs and menstrual cycle: Secondary | ICD-10-CM

## 2011-03-21 DIAGNOSIS — N898 Other specified noninflammatory disorders of vagina: Secondary | ICD-10-CM

## 2011-03-21 DIAGNOSIS — R109 Unspecified abdominal pain: Secondary | ICD-10-CM | POA: Insufficient documentation

## 2011-03-21 DIAGNOSIS — N939 Abnormal uterine and vaginal bleeding, unspecified: Secondary | ICD-10-CM

## 2011-03-21 HISTORY — DX: Gastro-esophageal reflux disease without esophagitis: K21.9

## 2011-03-21 HISTORY — DX: Essential (primary) hypertension: I10

## 2011-03-21 LAB — URINALYSIS, ROUTINE W REFLEX MICROSCOPIC
Glucose, UA: NEGATIVE mg/dL
Ketones, ur: NEGATIVE mg/dL
Protein, ur: NEGATIVE mg/dL
pH: 6.5 (ref 5.0–8.0)

## 2011-03-21 LAB — URINE MICROSCOPIC-ADD ON

## 2011-03-21 LAB — DIFFERENTIAL
Eosinophils Absolute: 0.1 10*3/uL (ref 0.0–0.7)
Lymphocytes Relative: 24 % (ref 12–46)
Lymphs Abs: 2.3 10*3/uL (ref 0.7–4.0)
Monocytes Relative: 7 % (ref 3–12)
Neutro Abs: 6.3 10*3/uL (ref 1.7–7.7)
Neutrophils Relative %: 67 % (ref 43–77)

## 2011-03-21 LAB — COMPREHENSIVE METABOLIC PANEL
ALT: 40 U/L — ABNORMAL HIGH (ref 0–35)
Alkaline Phosphatase: 99 U/L (ref 39–117)
BUN: 14 mg/dL (ref 6–23)
CO2: 29 mEq/L (ref 19–32)
Chloride: 94 mEq/L — ABNORMAL LOW (ref 96–112)
GFR calc Af Amer: >60 mL/min (ref >60)
GFR calc non Af Amer: >60 mL/min (ref >60)
Glucose, Bld: 129 mg/dL — ABNORMAL HIGH (ref 70–99)
Potassium: 3.7 mEq/L (ref 3.5–5.1)
Sodium: 130 mEq/L — ABNORMAL LOW (ref 135–145)
Total Bilirubin: 0.7 mg/dL (ref 0.3–1.2)

## 2011-03-21 LAB — CBC
Hemoglobin: 13.6 g/dL (ref 12.0–15.0)
MCH: 30.9 pg (ref 26.0–34.0)
RBC: 4.4 MIL/uL (ref 3.87–5.11)
WBC: 9.4 10*3/uL (ref 4.0–10.5)

## 2011-03-21 MED ORDER — SODIUM CHLORIDE 0.9 % IV SOLN: 4.0000 mg | Freq: Once | INTRAVENOUS | Status: DC

## 2011-03-21 MED ORDER — ONDANSETRON HCL 4 MG/2ML IJ SOLN
4.0000 mg | Freq: Once | INTRAMUSCULAR | Status: AC
Start: 2011-03-21 — End: 2011-03-21
  Administered 2011-03-21: 4 mg via INTRAVENOUS
  Filled 2011-03-21: qty 2

## 2011-03-21 MED ORDER — MORPHINE SULFATE 4 MG/ML IJ SOLN
4.0000 mg | Freq: Once | INTRAMUSCULAR | Status: AC
  Administered 2011-03-21: 4 mg via INTRAVENOUS
  Filled 2011-03-21: qty 1

## 2011-03-21 MED ORDER — HYDROMORPHONE HCL 1 MG/ML IJ SOLN: 2.0000 mg | Freq: Once | INTRAMUSCULAR | Status: DC

## 2011-03-21 MED ORDER — SODIUM CHLORIDE 0.9 % IV SOLN
999.0000 mL | INTRAVENOUS | Status: DC
  Administered 2011-03-21: 1000 mL via INTRAVENOUS

## 2011-03-21 NOTE — Progress Notes (Signed)
Onset of abdominal pain with bleeding for 2 days worse this morning, vomiting started upon arrival to [facility]U

## 2011-03-21 NOTE — ED Provider Notes (Signed)
Sydney Griffin 29 y.o. obese, AA, who appears very uncomfortable on admission to [facility]. She was evaluated by Dr. Tamela Oddi earlier in the week for this same problem. She was given Rx for oxycodone 5mg . Tablets and has been taking 1 -2 every 4 hours. Today despite taking the medication she continues to have severe low abdominal pain. She is scheduled to follow up in the chronic pelvic pain clinic in [place] on Monday. Her PMH includes ectopic pregnancy but her pregnancy test here today is negative.     Chief Complaint  Patient presents with  . Abdominal Pain  . Vaginal Bleeding    onset two days ago, was two days late for period  . Emesis   Patient is a 30 y.o. female presenting with abdominal pain, vaginal bleeding, and vomiting. The history is provided by the patient.  Abdominal Pain The primary symptoms of the illness include abdominal pain, vomiting and vaginal bleeding. The primary symptoms of the illness do not include fever or dysuria. The onset of the illness was gradual. The problem has been rapidly worsening.  Additional symptoms associated with the illness include back pain. Symptoms associated with the illness do not include chills or urgency.  Vaginal Bleeding Associated symptoms include abdominal pain and vomiting. Pertinent negatives include no chills or fever.  Emesis  Associated symptoms include abdominal pain. Pertinent negatives include no chills and no fever.    OB History    Grav Para Term Preterm Abortions TAB SAB Ect Mult Living   2 1 1  0 1   1  1       Past Medical History  Diagnosis Date  . Hypertension   . Acid reflux     Past Surgical History  Procedure Date  . Cesarean section   . Dilation and curettage of uterus     No family history on file.  History  Substance Use Topics  . Smoking status: Current Everyday Smoker -- 0.5 packs/day  . Smokeless tobacco: Not on file  . Alcohol Use: No    Allergies:  Allergies    Allergen Reactions  . Aspirin Shortness Of Breath  . Dilaudid (Hydromorphone Hcl) Other (See Comments)    Pt says medication causes her to shake uncontrollably.    Prescriptions prior to admission  Medication Sig Dispense Refill  . acetaminophen (TYLENOL) 500 MG tablet Take 500 mg by mouth every 6 (six) hours as needed. For pain.       Marland Kitchen esomeprazole (NEXIUM) 40 MG capsule Take 40 mg by mouth daily before breakfast.        . hydrochlorothiazide 25 MG tablet Take 25 mg by mouth daily.        . nebivolol (BYSTOLIC) 10 MG tablet Take 10 mg by mouth daily.        Marland Kitchen oxyCODONE (OXY IR/ROXICODONE) 5 MG immediate release tablet Take 5 mg by mouth every 4 (four) hours as needed. For pain.         Review of Systems  Constitutional: Negative for fever and chills.  Eyes: Negative.   Gastrointestinal: Positive for vomiting and abdominal pain.  Genitourinary: Positive for vaginal bleeding. Negative for dysuria and urgency.  Musculoskeletal: Positive for back pain.  Neurological: Negative for dizziness.   Physical Exam  General Appearance:    Alert, cooperative, no distress, appears stated age, obese  Head:    Normocephalic, without obvious abnormality, atraumatic  Back:     Symmetric, no curvature, ROM normal, no CVA tenderness  Lungs:  respirations unlabored  Genitalia:    Normal female without lesions, moderate vaginal bleeding, exam limited due to patient habitus, bil. Adnexal tenderness, CMT, unable to determine uterine size due to pt. Habitus.  Extremities:   Extremities normal  Skin:   Skin color, texture, turgor normal, no rashes or lesions  Neurologic: Alert and oriented, normal gait.     Blood pressure 121/65, pulse 79, temperature 97.5 F (36.4 C), temperature source Oral, resp. rate 20, height 5\' 9"  (1.753 m), weight 345 lb (156.491 kg), last menstrual period 02/13/2011.  Physical Exam  MAU Course  Procedures Pelvic ultrasound obtained and radiologist reading is normal  pelvic u/s.  MDM: After IV morphine and zofran the patient is feeling better and has asked several time for something to eat.  I will discharge the patient home with instruction to continue the oxycodone and keep her appointment with the Chronic Pain clinic on Monday.    [place], NP 03/21/11 1446  [place], NP 03/28/11 1204

## 2011-03-23 LAB — GC/CHLAMYDIA PROBE AMP, GENITAL: Chlamydia, DNA Probe: NEGATIVE

## 2011-04-23 ENCOUNTER — Encounter (HOSPITAL_BASED_OUTPATIENT_CLINIC_OR_DEPARTMENT_OTHER): Payer: Medicaid Other

## 2011-05-21 ENCOUNTER — Ambulatory Visit (HOSPITAL_BASED_OUTPATIENT_CLINIC_OR_DEPARTMENT_OTHER): Payer: Medicaid Other

## 2011-05-29 LAB — CBC
HCT: 37.4
Platelets: 211
RDW: 13.1

## 2011-06-11 ENCOUNTER — Ambulatory Visit (HOSPITAL_BASED_OUTPATIENT_CLINIC_OR_DEPARTMENT_OTHER): Payer: Medicaid Other | Attending: Internal Medicine

## 2011-06-11 DIAGNOSIS — I4949 Other premature depolarization: Secondary | ICD-10-CM | POA: Insufficient documentation

## 2011-06-11 DIAGNOSIS — G4730 Sleep apnea, unspecified: Secondary | ICD-10-CM | POA: Insufficient documentation

## 2011-06-11 DIAGNOSIS — G4710 Hypersomnia, unspecified: Secondary | ICD-10-CM | POA: Insufficient documentation

## 2011-06-13 DIAGNOSIS — G4710 Hypersomnia, unspecified: Secondary | ICD-10-CM

## 2011-06-13 DIAGNOSIS — G4730 Sleep apnea, unspecified: Secondary | ICD-10-CM

## 2011-06-13 DIAGNOSIS — R0989 Other specified symptoms and signs involving the circulatory and respiratory systems: Secondary | ICD-10-CM

## 2011-06-13 DIAGNOSIS — R0609 Other forms of dyspnea: Secondary | ICD-10-CM

## 2011-06-13 NOTE — Procedures (Signed)
Sydney Griffin, Sydney Griffin             ACCOUNT NO.:  [id number]  MEDICAL RECORD NO.:  [id number]          PATIENT TYPE:  OUT  LOCATION:  SLEEP CENTER                 FACILITY:  [facility]  PHYSICIAN:  Edsel Shives D. Maple Hudson, MD, FCCP, FACPDATE OF BIRTH:  1983/02/11  DATE OF STUDY:  06/11/2011                           NOCTURNAL POLYSOMNOGRAM  REFERRING PHYSICIAN:  Lisette Abu HILTY  REFERRING PHYSICIAN:  Chrystie Nose, MD  INDICATIONS FOR STUDY:  Hypersomnia with sleep apnea.  EPWORTH SLEEPINESS SCORE:  3/24, BMI 48.6, weight 329 pounds, height 69 inches, neck 15.5 inches.  MEDICATIONS:  Home medications are charted and reviewed.  SLEEP ARCHITECTURE:  Total sleep time 234 minutes with sleep efficiency 65.2%.  Stage I was 8.3%, stage II 67.9%, stage III 9%, stage REM 14.7% of total sleep time.  Sleep latency 29 minutes, REM latency 64.5 minutes, awake after sleep onset 32 minutes, arousal index 36.9.  BEDTIME MEDICATIONS:  None.  RESPIRATORY DATA:  Apnea/hypopnea index (AHI) 2.1 per hour.  A total of 8 events were scored including 2 obstructive apneas and 6 hypopneas. Events were not positional.  REM AHI 7 per hour.  There were insufficient numbers of events to permit application of CPAP titration by split protocol on the study night.  OXYGEN DATA:  Mild-to-moderate snoring with oxygen desaturation to a nadir of 91% and a mean oxygen saturation through the study of 95.5% on room air.  CARDIAC DATA:  Sinus rhythm with PVCs.  MOVEMENT-PARASOMNIA:  No significant movement disturbance.  Bathroom x1.  IMPRESSION-RECOMMENDATIONS:  Occasional respiratory event with sleep disturbance, within normal limits.  Apnea/hypopnea index 2.1 per hour (the normal range for adults is between 0-5 events per hour).  Mild-to- moderate snoring with oxygen desaturation to a nadir of 91% and a mean oxygen saturation through the study of 95.5% on room air.     Cailyn Houdek D. Maple Hudson, MD, [facility], FACP Diplomate,  Biomedical engineer of Sleep Medicine Electronically Signed    CDY/MEDQ  D:  06/13/2011 12:08:21  T:  06/13/2011 12:26:15  Job:  [id number]

## 2011-06-19 LAB — URINALYSIS, ROUTINE W REFLEX MICROSCOPIC
Glucose, UA: NEGATIVE
Hgb urine dipstick: NEGATIVE
Ketones, ur: NEGATIVE
Ketones, ur: NEGATIVE
Leukocytes, UA: NEGATIVE
Nitrite: NEGATIVE
Protein, ur: NEGATIVE
Specific Gravity, Urine: 1.037 — ABNORMAL HIGH
Urobilinogen, UA: 1
pH: 5.5

## 2011-06-19 LAB — URINE MICROSCOPIC-ADD ON

## 2011-06-19 LAB — CBC
HCT: 40.1
Hemoglobin: 13.5
MCV: 92.3
RBC: 4.34
WBC: 10.5

## 2011-06-19 LAB — POCT PREGNANCY, URINE: Operator id: 277751

## 2011-06-19 LAB — DIFFERENTIAL
Eosinophils Absolute: 0.3
Eosinophils Relative: 3
Lymphs Abs: 3.5 — ABNORMAL HIGH
Monocytes Absolute: 0.8 — ABNORMAL HIGH
Monocytes Relative: 7

## 2011-06-19 LAB — WET PREP, GENITAL: Clue Cells Wet Prep HPF POC: NONE SEEN

## 2011-07-01 ENCOUNTER — Encounter (HOSPITAL_COMMUNITY): Payer: Self-pay | Admitting: *Deleted

## 2011-07-01 ENCOUNTER — Inpatient Hospital Stay (HOSPITAL_COMMUNITY)
Admission: AD | Admit: 2011-07-01 | Discharge: 2011-07-01 | Disposition: A | Discharge status: Home / Self Care | Payer: Medicaid Other | Source: Ambulatory Visit | Attending: Obstetrics & Gynecology | Admitting: Obstetrics & Gynecology

## 2011-07-01 DIAGNOSIS — N938 Other specified abnormal uterine and vaginal bleeding: Secondary | ICD-10-CM | POA: Insufficient documentation

## 2011-07-01 DIAGNOSIS — N949 Unspecified condition associated with female genital organs and menstrual cycle: Secondary | ICD-10-CM | POA: Insufficient documentation

## 2011-07-01 LAB — CBC
HCT: 40.1 % (ref 36.0–46.0)
Hemoglobin: 13.3 g/dL (ref 12.0–15.0)
MCV: 92.2 fL (ref 78.0–100.0)
RDW: 13.7 % (ref 11.5–15.5)
WBC: 10.1 10*3/uL (ref 4.0–10.5)

## 2011-07-01 LAB — URINALYSIS, ROUTINE W REFLEX MICROSCOPIC
Glucose, UA: NEGATIVE mg/dL
Hgb urine dipstick: NEGATIVE
Leukocytes, UA: NEGATIVE
Protein, ur: NEGATIVE mg/dL
pH: 6 (ref 5.0–8.0)

## 2011-07-01 NOTE — Progress Notes (Signed)
Patient states she has been having heavy bleeding for 3 days. Has a tampon in at this time and unable to assess bleeding. Patient states she on pain medication for the abdominal pain. Has a history of painful periods and heavy bleeding.

## 2011-07-01 NOTE — Progress Notes (Signed)
Pt in c/o heavy bleeding with clots and cramping x 3 days.  Believes this is a normal period, has hx of heavy periods.  Was told by Doctors office to come here.  Denies any hx of fibroids.  Was on Lupron in past for heavy periods, but stopped due to blood pressure.  Is not on anything presently.

## 2011-07-04 NOTE — ED Provider Notes (Signed)
In to see patient and exam room was empty so I was unable to evaluate the patient.  [place], NP 07/04/11 1600   [place], [place] 07/11/11 1312

## 2011-08-02 ENCOUNTER — Emergency Department (HOSPITAL_COMMUNITY)
Admission: EM | Admit: 2011-08-02 | Discharge: 2011-08-02 | Disposition: A | Discharge status: Home / Self Care | Payer: Medicaid Other | Attending: Emergency Medicine | Admitting: Emergency Medicine

## 2011-08-02 ENCOUNTER — Emergency Department (HOSPITAL_COMMUNITY): Payer: Medicaid Other

## 2011-08-02 ENCOUNTER — Encounter (HOSPITAL_COMMUNITY): Payer: Self-pay

## 2011-08-02 DIAGNOSIS — K219 Gastro-esophageal reflux disease without esophagitis: Secondary | ICD-10-CM | POA: Insufficient documentation

## 2011-08-02 DIAGNOSIS — S62339A Displaced fracture of neck of unspecified metacarpal bone, initial encounter for closed fracture: Secondary | ICD-10-CM | POA: Insufficient documentation

## 2011-08-02 DIAGNOSIS — S62309A Unspecified fracture of unspecified metacarpal bone, initial encounter for closed fracture: Secondary | ICD-10-CM

## 2011-08-02 DIAGNOSIS — W010XXA Fall on same level from slipping, tripping and stumbling without subsequent striking against object, initial encounter: Secondary | ICD-10-CM | POA: Insufficient documentation

## 2011-08-02 DIAGNOSIS — I10 Essential (primary) hypertension: Secondary | ICD-10-CM | POA: Insufficient documentation

## 2011-08-02 DIAGNOSIS — M7989 Other specified soft tissue disorders: Secondary | ICD-10-CM | POA: Insufficient documentation

## 2011-08-02 DIAGNOSIS — M79609 Pain in unspecified limb: Secondary | ICD-10-CM | POA: Insufficient documentation

## 2011-08-02 MED ORDER — HYDROCODONE-ACETAMINOPHEN 5-500 MG PO TABS: 1.0000 | ORAL_TABLET | Freq: Four times a day (QID) | ORAL | Status: DC | PRN

## 2011-08-02 MED ORDER — OXYCODONE-ACETAMINOPHEN 5-325 MG PO TABS
1.0000 | ORAL_TABLET | Freq: Once | ORAL | Status: AC
  Administered 2011-08-02: 1 via ORAL
  Filled 2011-08-02: qty 1

## 2011-08-02 MED ORDER — MORPHINE SULFATE 4 MG/ML IJ SOLN
4.0000 mg | Freq: Once | INTRAMUSCULAR | Status: AC
  Administered 2011-08-02: 4 mg via INTRAMUSCULAR
  Filled 2011-08-02: qty 1

## 2011-08-02 MED ORDER — OXYCODONE-ACETAMINOPHEN 5-325 MG PO TABS: 2.0000 | ORAL_TABLET | ORAL | Status: DC | PRN

## 2011-08-02 NOTE — ED Provider Notes (Signed)
History     CSN: [id number] Arrival date & time: 08/02/2011  1:21 PM   First MD Initiated Contact with Patient 08/02/11 1343      Chief Complaint  Patient presents with  . Hand Pain    (Consider location/radiation/quality/duration/timing/severity/associated sxs/prior treatment) Patient is a 30 y.o. female presenting with hand pain.  Hand Pain This is a new problem. The current episode started yesterday. The problem occurs constantly. The problem has been unchanged. Associated symptoms include arthralgias and joint swelling. Pertinent negatives include no numbness or weakness. The symptoms are aggravated by nothing. She has tried nothing for the symptoms.  Pt states she slipped on the floor last night and fell, landing onto right hand. States now pain and swelling. Pain with hand movement  Past Medical History  Diagnosis Date  . Hypertension   . Acid reflux   . Menstrual bleeding problem     Past Surgical History  Procedure Date  . Cesarean section   . Dilation and curettage of uterus   . Laparoscopy     History reviewed. No pertinent family history.  History  Substance Use Topics  . Smoking status: Current Everyday Smoker -- 0.5 packs/day    Types: Cigarettes  . Smokeless tobacco: Not on file  . Alcohol Use: No    OB History    Grav Para Term Preterm Abortions TAB SAB Ect Mult Living   2 1 1  0 1   1  1       Review of Systems  Constitutional: Negative.   HENT: Negative.   Eyes: Negative.   Respiratory: Negative.   Cardiovascular: Negative.   Gastrointestinal: Negative.   Genitourinary: Negative.   Musculoskeletal: Positive for joint swelling and arthralgias.  Skin: Negative.   Neurological: Negative.  Negative for weakness and numbness.  Psychiatric/Behavioral: Negative.     Allergies  Aspirin; Dilaudid; and Ibuprofen  Home Medications   Current Outpatient Rx  Name Route Sig Dispense Refill  . ESOMEPRAZOLE MAGNESIUM 40 MG PO CPDR Oral Take 40 mg  by mouth daily before breakfast.      . HYDROCHLOROTHIAZIDE 25 MG PO TABS Oral Take 25 mg by mouth daily.      . NEBIVOLOL HCL 10 MG PO TABS Oral Take 10 mg by mouth daily.      . OXYCODONE HCL 5 MG PO TABS Oral Take 5 mg by mouth every 4 (four) hours as needed. For pain.     . ACETAMINOPHEN 500 MG PO TABS Oral Take 500 mg by mouth every 6 (six) hours as needed. For pain.       BP 153/63  Pulse 86  Temp(Src) 97.9 F (36.6 C) (Oral)  Resp 22  SpO2 99%  Physical Exam  Nursing note and vitals reviewed. Constitutional: She is oriented to person, place, and time. She appears well-developed and well-nourished.  HENT:  Head: Normocephalic.  Neck: Neck supple.  Cardiovascular: Normal rate, regular rhythm and normal heart sounds.   Pulmonary/Chest: Effort normal and breath sounds normal. No respiratory distress.  Musculoskeletal: She exhibits edema and tenderness.       Swelling noted over right hand over 4th and 5th metacarpals. Tender to palpation over 4th and 5th metacarpals. Normal wrist exam. Pain with flexion and extension of 4 thand 5th finger at the PIP joint  Neurological: She is alert and oriented to person, place, and time.  Skin: Skin is warm and dry.  Psychiatric: She has a normal mood and affect.    ED Course  Procedures (including critical care time)  Dg Hand Complete Right  08/02/2011  *RADIOLOGY REPORT*  Clinical Data: 30 year old female with right hand pain following fall.  RIGHT HAND - COMPLETE 3+ VIEW  Comparison: None  Findings: An oblique fracture of the distal fifth metacarpal is noted with 2 mm radial displacement. The fracture line does not appear to involve the articular surface. There is no evidence of subluxation or dislocation. No other bony abnormalities are present.  IMPRESSION: Distal fifth metacarpal fracture.  Original Report Authenticated By: Rosendo Gros, M.D.     Pt with distal fifth metacarpal fracture. Splint applied by an ortho tech. Will d/c home  with    MDM          Lottie Mussel, PA 08/02/11 1607

## 2011-08-02 NOTE — Progress Notes (Signed)
Orthopedic Tech Progress Note Patient Details:  Sydney Griffin [birth year] [id number]  Other Ortho Devices Type of Ortho Device:  (ulnar gutter;foam arm sling) Ortho Device Location: right hand Ortho Device Interventions: Application   Nikki Dom 08/02/2011, 4:14 PM

## 2011-08-02 NOTE — ED Notes (Signed)
Last night patient slipped int he kitchen, landed on the right outer hand, pain and swelling

## 2011-08-02 NOTE — ED Provider Notes (Signed)
Medical screening examination/treatment/procedure(s) were performed by non-physician practitioner and as supervising physician I was immediately available for consultation/collaboration.   Laray Anger, DO 08/02/11 2344

## 2011-08-06 ENCOUNTER — Other Ambulatory Visit: Payer: Self-pay | Admitting: Obstetrics & Gynecology

## 2011-08-06 DIAGNOSIS — O3680X0 Pregnancy with inconclusive fetal viability, not applicable or unspecified: Secondary | ICD-10-CM

## 2011-08-08 ENCOUNTER — Inpatient Hospital Stay (HOSPITAL_COMMUNITY): Payer: Medicaid Other

## 2011-08-08 ENCOUNTER — Encounter (HOSPITAL_COMMUNITY): Payer: Self-pay | Admitting: *Deleted

## 2011-08-08 ENCOUNTER — Inpatient Hospital Stay (HOSPITAL_COMMUNITY)
Admission: AD | Admit: 2011-08-08 | Discharge: 2011-08-08 | Disposition: A | Discharge status: Home / Self Care | Payer: Medicaid Other | Source: Ambulatory Visit | Attending: Obstetrics & Gynecology | Admitting: Obstetrics & Gynecology

## 2011-08-08 DIAGNOSIS — Z1389 Encounter for screening for other disorder: Secondary | ICD-10-CM

## 2011-08-08 DIAGNOSIS — R109 Unspecified abdominal pain: Secondary | ICD-10-CM | POA: Insufficient documentation

## 2011-08-08 DIAGNOSIS — Z3490 Encounter for supervision of normal pregnancy, unspecified, unspecified trimester: Secondary | ICD-10-CM

## 2011-08-08 DIAGNOSIS — B373 Candidiasis of vulva and vagina: Secondary | ICD-10-CM | POA: Insufficient documentation

## 2011-08-08 DIAGNOSIS — O2390 Unspecified genitourinary tract infection in pregnancy, unspecified trimester: Secondary | ICD-10-CM | POA: Insufficient documentation

## 2011-08-08 DIAGNOSIS — B3731 Acute candidiasis of vulva and vagina: Secondary | ICD-10-CM | POA: Insufficient documentation

## 2011-08-08 LAB — URINE MICROSCOPIC-ADD ON

## 2011-08-08 LAB — OB RESULTS CONSOLE ABO/RH: RH Type: POSITIVE

## 2011-08-08 LAB — URINALYSIS, ROUTINE W REFLEX MICROSCOPIC
Bilirubin Urine: NEGATIVE
Hgb urine dipstick: NEGATIVE
Specific Gravity, Urine: 1.015 (ref 1.005–1.030)
pH: 7 (ref 5.0–8.0)

## 2011-08-08 LAB — WET PREP, GENITAL

## 2011-08-08 LAB — ABO/RH: ABO/RH(D): O POS

## 2011-08-08 LAB — OB RESULTS CONSOLE ANTIBODY SCREEN: Antibody Screen: NEGATIVE

## 2011-08-08 LAB — CBC
HCT: 38.8 % (ref 36.0–46.0)
Hemoglobin: 12.7 g/dL (ref 12.0–15.0)
WBC: 10.7 10*3/uL — ABNORMAL HIGH (ref 4.0–10.5)

## 2011-08-08 MED ORDER — CLOTRIMAZOLE 1 % VA CREA: 1.0000 | TOPICAL_CREAM | Freq: Two times a day (BID) | VAGINAL | Status: AC

## 2011-08-08 NOTE — ED Provider Notes (Signed)
Chief Complaint:  Abdominal Pain   Sydney Griffin is  30 y.o. G3P1011.  Patient's last menstrual period was 06/22/2011.Marland Kitchen  Her pregnancy status is positive.  [redacted]w[redacted]d She presents complaining of Abdominal Pain  Onset is described as sudden and has been present for  1 days. Reports red spotting in tissue this morning after voiding. Pt concerned r/t hx of ectopic pregnancy last year.  Obstetrical/Gynecological History: OB History    Grav Para Term Preterm Abortions TAB SAB Ect Mult Living   3 1 1  0 1   1  1       Past Medical History: Past Medical History  Diagnosis Date  . Hypertension   . Acid reflux   . Menstrual bleeding problem     Past Surgical History: Past Surgical History  Procedure Date  . Cesarean section   . Dilation and curettage of uterus   . Laparoscopy     Family History: No family history on file.  Social History: History  Substance Use Topics  . Smoking status: Current Everyday Smoker -- 0.5 packs/day    Types: Cigarettes  . Smokeless tobacco: Not on file  . Alcohol Use: No    Allergies:  Allergies  Allergen Reactions  . Aspirin Shortness Of Breath  . Dilaudid (Hydromorphone Hcl) Other (See Comments)    Pt says medication causes her to shake uncontrollably.  . Ibuprofen     Gi upset    Prescriptions prior to admission  Medication Sig Dispense Refill  . acetaminophen (TYLENOL) 500 MG tablet Take 1,000 mg by mouth every 6 (six) hours as needed. For pain.      Marland Kitchen esomeprazole (NEXIUM) 40 MG capsule Take 40 mg by mouth daily before breakfast.       . nebivolol (BYSTOLIC) 10 MG tablet Take 10 mg by mouth daily.       Marland Kitchen oxyCODONE-acetaminophen (PERCOCET) 5-325 MG per tablet Take 2 tablets by mouth every 8 (eight) hours as needed. pain       . DISCONTD: oxyCODONE-acetaminophen (PERCOCET) 5-325 MG per tablet Take 2 tablets by mouth every 4 (four) hours as needed for pain.  15 tablet  0    Review of Systems - Negative except what has been reviewed in  the HPI  Physical Exam   Blood pressure 134/49, pulse 73, temperature 97.6 F (36.4 C), resp. rate 18, height 5\' 9"  (1.753 m), weight 156.491 kg (345 lb), last menstrual period 06/22/2011.  General: General appearance - alert, well appearing, and in no distress, oriented to person, place, and time and overweight Mental status - alert, oriented to person, place, and time, normal mood, behavior, speech, dress, motor activity, and thought processes, affect appropriate to mood Abdomen - soft, nontender, nondistended, no masses or organomegaly Obese Focused Gynecological Exam: VULVA: vulvar excoriation pink, vulvar hypopigmentation c/w candida, VAGINA: normal appearing vagina with normal color and discharge, no lesions, no bleeding noted, CERVIX: normal appearing cervix without discharge or lesions, UTERUS: enlarged to 6 week's size, ADNEXA: normal adnexa in size, nontender and no masses  Labs: Recent Results (from the past 24 hour(s))  URINALYSIS, ROUTINE W REFLEX MICROSCOPIC   Collection Time   08/08/11  9:15 AM      Component Value Range   Color, Urine YELLOW  YELLOW    APPearance CLEAR  CLEAR    Specific Gravity, Urine 1.015  1.005 - 1.030    pH 7.0  5.0 - 8.0    Glucose, UA NEGATIVE  NEGATIVE (mg/dL)   Hgb  urine dipstick NEGATIVE  NEGATIVE    Bilirubin Urine NEGATIVE  NEGATIVE    Ketones, ur 15 (*) NEGATIVE (mg/dL)   Protein, ur 30 (*) NEGATIVE (mg/dL)   Urobilinogen, UA 2.0 (*) 0.0 - 1.0 (mg/dL)   Nitrite NEGATIVE  NEGATIVE    Leukocytes, UA NEGATIVE  NEGATIVE   URINE MICROSCOPIC-ADD ON   Collection Time   08/08/11  9:15 AM      Component Value Range   Squamous Epithelial / LPF FEW (*) RARE    WBC, UA 0-2  <3 (WBC/hpf)   RBC / HPF 0-2  <3 (RBC/hpf)   Bacteria, UA RARE  RARE   POCT PREGNANCY, URINE   Collection Time   08/08/11  9:22 AM      Component Value Range   Preg Test, Ur POSITIVE    HCG, QUANTITATIVE, PREGNANCY   Collection Time   08/08/11  9:59 AM      Component  Value Range   hCG, Beta Chain, Quant, S 5812 (*) <5 (mIU/mL)  ABO/RH   Collection Time   08/08/11  9:59 AM      Component Value Range   ABO/RH(D) O POS    CBC   Collection Time   08/08/11  9:59 AM      Component Value Range   WBC 10.7 (*) 4.0 - 10.5 (K/uL)   RBC 4.13  3.87 - 5.11 (MIL/uL)   Hemoglobin 12.7  12.0 - 15.0 (g/dL)   HCT 16.1  09.6 - 04.5 (%)   MCV 93.9  78.0 - 100.0 (fL)   MCH 30.8  26.0 - 34.0 (pg)   MCHC 32.7  30.0 - 36.0 (g/dL)   RDW 40.9  81.1 - 91.4 (%)   Platelets 179  150 - 400 (K/uL)  WET PREP, GENITAL   Collection Time   08/08/11 10:00 AM      Component Value Range   Yeast, Wet Prep FEW (*) NONE SEEN    Trich, Wet Prep NONE SEEN  NONE SEEN    Clue Cells, Wet Prep NONE SEEN  NONE SEEN    WBC, Wet Prep HPF POC FEW (*) NONE SEEN    Imaging Studies: IUP: Gest sac 10mm ([redacted]w[redacted]d) with + yolk sac. BL ovaries NL. No free fluid  US Ob Transvaginal  08/08/2011  OBSTETRICAL ULTRASOUND: This exam was performed within a [facility]. The OB US report was generated in the [facility] system, and faxed to the ordering physician.   This report is also available in [employer] and in the [employer]. See [facility] Obstetric US report.   Assessment: IUP Confirmed on [place] Candida  Plan: Discharge home Rx Gyne-Lotri FU with [facility] to begin [facility]  Sydney Griffin E. 08/08/2011,10:59 AM

## 2011-08-08 NOTE — Progress Notes (Signed)
Pt reports having lower abd cramping for several days and some spotting. Pt has history of ectopic pregnancy in the past. Has started prenatal care witn Dr. Tamela Oddi. No u/s ordered yet

## 2011-08-10 ENCOUNTER — Ambulatory Visit (HOSPITAL_COMMUNITY)
Admission: RE | Admit: 2011-08-10 | Discharge: 2011-08-10 | Disposition: A | Discharge status: Home / Self Care | Payer: Medicaid Other | Source: Ambulatory Visit | Attending: Obstetrics & Gynecology | Admitting: Obstetrics & Gynecology

## 2011-08-10 DIAGNOSIS — O3680X0 Pregnancy with inconclusive fetal viability, not applicable or unspecified: Secondary | ICD-10-CM

## 2011-08-10 DIAGNOSIS — E669 Obesity, unspecified: Secondary | ICD-10-CM | POA: Insufficient documentation

## 2011-08-10 DIAGNOSIS — Z3689 Encounter for other specified antenatal screening: Secondary | ICD-10-CM | POA: Insufficient documentation

## 2011-09-03 ENCOUNTER — Other Ambulatory Visit: Payer: Self-pay | Admitting: Obstetrics

## 2011-09-03 ENCOUNTER — Other Ambulatory Visit: Payer: Self-pay | Admitting: Obstetrics & Gynecology

## 2011-09-03 DIAGNOSIS — Z3682 Encounter for antenatal screening for nuchal translucency: Secondary | ICD-10-CM

## 2011-09-17 ENCOUNTER — Ambulatory Visit (HOSPITAL_COMMUNITY)
Admission: RE | Admit: 2011-09-17 | Discharge: 2011-09-17 | Disposition: A | Discharge status: Home / Self Care | Payer: Medicaid Other | Source: Ambulatory Visit | Attending: Obstetrics & Gynecology | Admitting: Obstetrics & Gynecology

## 2011-09-17 ENCOUNTER — Encounter (HOSPITAL_COMMUNITY): Payer: Self-pay

## 2011-09-17 ENCOUNTER — Ambulatory Visit (HOSPITAL_COMMUNITY)
Admission: RE | Admit: 2011-09-17 | Discharge: 2011-09-17 | Disposition: A | Discharge status: Home / Self Care | Payer: Medicaid Other | Source: Ambulatory Visit | Attending: Obstetrics | Admitting: Obstetrics

## 2011-09-17 VITALS — BP 145/78 | HR 75 | Wt 355.0 lb

## 2011-09-17 DIAGNOSIS — O3510X0 Maternal care for (suspected) chromosomal abnormality in fetus, unspecified, not applicable or unspecified: Secondary | ICD-10-CM | POA: Insufficient documentation

## 2011-09-17 DIAGNOSIS — O351XX0 Maternal care for (suspected) chromosomal abnormality in fetus, not applicable or unspecified: Secondary | ICD-10-CM | POA: Insufficient documentation

## 2011-09-17 DIAGNOSIS — E669 Obesity, unspecified: Secondary | ICD-10-CM | POA: Insufficient documentation

## 2011-09-17 DIAGNOSIS — O10019 Pre-existing essential hypertension complicating pregnancy, unspecified trimester: Secondary | ICD-10-CM | POA: Insufficient documentation

## 2011-09-17 DIAGNOSIS — O99330 Smoking (tobacco) complicating pregnancy, unspecified trimester: Secondary | ICD-10-CM | POA: Insufficient documentation

## 2011-09-17 DIAGNOSIS — O99210 Obesity complicating pregnancy, unspecified trimester: Secondary | ICD-10-CM | POA: Insufficient documentation

## 2011-09-17 DIAGNOSIS — Z3682 Encounter for antenatal screening for nuchal translucency: Secondary | ICD-10-CM

## 2011-09-17 NOTE — Progress Notes (Signed)
Sydney Griffin was seen for ultrasound appointment today.  Please see AS-OBGYN report for details.

## 2011-09-18 NOTE — Progress Notes (Signed)
Genetic Counseling  High-Risk Gestation Note  Appointment Date:  09/18/2011 Referred By: Roseanna Rainbow, * Date of Birth:  Jul 10, 1981 Partner:  Marissa Nestle    Pregnancy History: [id number] Estimated Date of Delivery: 03/28/12 Estimated Gestational Age: [redacted]w[redacted]d Attending: Rema Fendt, MD   Ms. [name] and her partner, Mr. Marissa Nestle, were seen for genetic counseling because of a family history of Down syndrome. However, Ms. Irizarry reported at the time of today's visit that she incorrectly reported to her OB that she has a cousin with Down syndrome, and this individual does not have that diagnosis but may have Attention Deficit Hyperactivity Disorder (ADHD).    Both family histories were reviewed and found to be contributory for birth defects. The father of the pregnancy reported a female paternal first cousin once removed with cleft lip, which was surgically corrected after birth. She is reportedly otherwise in good health. We discussed that cleft lip +/- cleft palate can be syndromic or isolated.  If Mr. Lawrence's relative has a syndromic form of clefting, the chance of having an affected child depends on the inheritance pattern of that condition.  If this relative has an isolated form of clefting, we discussed the probable multifactorial inheritance and explained that genetic testing for isolated cleft lip +/- cleft palate is not currently available.  Based on the family history, this couple's chance to have a baby with an isolated cleft lip +/- cleft palate is not expected to be increased above the general population risk given the reported apparently isolated cleft lip and assuming multifactorial inheritance.  Mr. Lyman Bishop reported that he was "born pigeon toed" and reportedly had surgical correction after birth. He reported that his niece (his paternal half-sister's daughter) was also born "pigeon toed." He reported that it was not described to him as being club foot.  Intoeing, or "pigeon toe" can be due to multiple causes including tibial torsion, femoral anteversion, and metatarsus adductus.  Each of these underlying causes can also have different etiologies. Additional information is needed regarding the underlying cause for intoeing in the family in order to assess the recurrence risk for relatives. Without further information regarding the provided family history, an accurate genetic risk cannot be calculated. Further genetic counseling is warranted if more information is obtained.  We reviewed the available screening option of First screen.  They understand that screening tests are used to modify a patient's a priori risk for aneuploidy, typically based on age.  This estimate provides a pregnancy specific risk assessment.  After careful consideration, Ms. Kavanaugh elected to have First screen at the time of today's visit.  Complete ultrasound results reported separately.   Ms. Rudge was provided with written information regarding sickle cell anemia (SCA) including the carrier frequency and incidence in the African-American population, the availability of carrier testing and prenatal diagnosis if indicated.  In addition, we discussed that hemoglobinopathies are routinely screened for as part of the [place] newborn screening panel.  She declined hemoglobin electrophoresis today.  Ms. Mcnamara denied exposure to environmental toxins. She denied the use of alcohol, tobacco or street drugs. She denied significant viral illnesses during the course of her pregnancy. Her medical and surgical histories were contributory for hypertension and acid reflux, for which she is treated with bystolic and nexium.   I counseled this couple regarding the above risks and available options.  The approximate face-to-face time with the genetic counselor was 20 minutes.  Quinn Plowman, MS,  Certified Genetic Counselor 09/18/2011

## 2011-09-28 ENCOUNTER — Other Ambulatory Visit: Payer: Self-pay

## 2011-09-29 ENCOUNTER — Encounter (HOSPITAL_COMMUNITY): Payer: Self-pay | Admitting: *Deleted

## 2011-09-29 ENCOUNTER — Inpatient Hospital Stay (HOSPITAL_COMMUNITY)
Admission: AD | Admit: 2011-09-29 | Discharge: 2011-09-29 | Disposition: A | Discharge status: Home / Self Care | Payer: Medicaid Other | Source: Ambulatory Visit | Attending: Obstetrics | Admitting: Obstetrics

## 2011-09-29 DIAGNOSIS — G43909 Migraine, unspecified, not intractable, without status migrainosus: Secondary | ICD-10-CM | POA: Insufficient documentation

## 2011-09-29 DIAGNOSIS — O10019 Pre-existing essential hypertension complicating pregnancy, unspecified trimester: Secondary | ICD-10-CM | POA: Insufficient documentation

## 2011-09-29 DIAGNOSIS — O99891 Other specified diseases and conditions complicating pregnancy: Secondary | ICD-10-CM | POA: Insufficient documentation

## 2011-09-29 DIAGNOSIS — O26899 Other specified pregnancy related conditions, unspecified trimester: Secondary | ICD-10-CM

## 2011-09-29 HISTORY — DX: Headache: R51

## 2011-09-29 LAB — URINALYSIS, ROUTINE W REFLEX MICROSCOPIC
Ketones, ur: NEGATIVE mg/dL
Leukocytes, UA: NEGATIVE
Nitrite: NEGATIVE
Specific Gravity, Urine: 1.02 (ref 1.005–1.030)
Urobilinogen, UA: 0.2 mg/dL (ref 0.0–1.0)
pH: 6 (ref 5.0–8.0)

## 2011-09-29 MED ORDER — LABETALOL HCL 200 MG PO TABS: 200.0000 mg | ORAL_TABLET | Freq: Two times a day (BID) | ORAL | Status: DC

## 2011-09-29 MED ORDER — DIPHENHYDRAMINE HCL 25 MG PO CAPS
25.0000 mg | ORAL_CAPSULE | ORAL | Status: AC
  Administered 2011-09-29: 25 mg via ORAL
  Filled 2011-09-29: qty 1

## 2011-09-29 MED ORDER — METOCLOPRAMIDE HCL 10 MG PO TABS
10.0000 mg | ORAL_TABLET | ORAL | Status: AC
  Administered 2011-09-29: 10 mg via ORAL
  Filled 2011-09-29: qty 1

## 2011-09-29 MED ORDER — DEXAMETHASONE 0.5 MG PO TABS
1.0000 mg | ORAL_TABLET | ORAL | Status: AC
  Administered 2011-09-29: 1 mg via ORAL
  Filled 2011-09-29: qty 2

## 2011-09-29 MED ORDER — ACETAMINOPHEN-CODEINE #3 300-30 MG PO TABS: 1.0000 | ORAL_TABLET | ORAL | Status: AC | PRN

## 2011-09-29 NOTE — ED Provider Notes (Signed)
History    G3P1011 at [redacted]w[redacted]d presents to MAU with report of h/a x3 days mostly on left side, radiating to top of her head.  She is taking hydrocodone/tylenol prescribed for this but it is not helping.  She has a history of chronic hypertension and is currently completing a 24 hour urine.    Chief Complaint  Patient presents with  . Headache   HPI  OB History    Grav Para Term Preterm Abortions TAB SAB Ect Mult Living   3 1 1  0 1   1  1       Past Medical History  Diagnosis Date  . Hypertension   . Acid reflux   . Menstrual bleeding problem   . Headache     Past Surgical History  Procedure Date  . Cesarean section   . Dilation and curettage of uterus   . Laparoscopy     No family history on file.  History  Substance Use Topics  . Smoking status: Current Everyday Smoker -- 0.5 packs/day    Types: Cigarettes  . Smokeless tobacco: Not on file  . Alcohol Use: No    Allergies:  Allergies  Allergen Reactions  . Aspirin Shortness Of Breath  . Dilaudid (Hydromorphone Hcl) Other (See Comments)    Pt says medication causes her to shake uncontrollably.  . Ibuprofen     Gi upset    Prescriptions prior to admission  Medication Sig Dispense Refill  . acetaminophen (TYLENOL) 500 MG tablet Take 1,000 mg by mouth every 6 (six) hours as needed. For pain.      Marland Kitchen esomeprazole (NEXIUM) 40 MG capsule Take 40 mg by mouth daily before breakfast.       . nebivolol (BYSTOLIC) 10 MG tablet Take 10 mg by mouth daily.       Marland Kitchen oxyCODONE-acetaminophen (PERCOCET) 5-325 MG per tablet Take 2 tablets by mouth every 4 (four) hours as needed. Takes for pain      . Prenatal Vit-Fe Fumarate-FA (PRENATAL MULTIVITAMIN) TABS Take 1 tablet by mouth daily.        Review of Systems  Constitutional: Negative.   Eyes: Negative.   Respiratory: Negative.   Cardiovascular: Negative.   Gastrointestinal: Negative.   Genitourinary: Negative.   Musculoskeletal: Negative.   Skin: Negative.   Neurological:  Positive for headaches.  Endo/Heme/Allergies: Negative.   Psychiatric/Behavioral: Negative.   She reports swelling of her ankles which is worse when she is up during the day and improves when lying down.  Physical Exam   Blood pressure 148/81, pulse 72, temperature 98.3 F (36.8 C), temperature source Oral, resp. rate 18, height 5\' 9"  (1.753 m), weight 161.027 kg (355 lb), last menstrual period 06/22/2011.  Physical Exam  Constitutional: She is oriented to person, place, and time. She appears well-developed and well-nourished.  Neck: Normal range of motion.  Respiratory: Effort normal.  GI: Soft.  Musculoskeletal: Normal range of motion.  Neurological: She is alert and oriented to person, place, and time. She has normal reflexes.  Skin: Skin is warm and dry.  Psychiatric: She has a normal mood and affect. Her behavior is normal. Judgment and thought content normal.  She has +1 edema BLE  Recent Results (from the past 168 hour(s))  URINALYSIS, ROUTINE W REFLEX MICROSCOPIC   Collection Time   09/29/11  9:30 PM      Component Value Range   Color, Urine YELLOW  YELLOW    APPearance CLEAR  CLEAR    Specific Gravity,  Urine 1.020  1.005 - 1.030    pH 6.0  5.0 - 8.0    Glucose, UA NEGATIVE  NEGATIVE (mg/dL)   Hgb urine dipstick NEGATIVE  NEGATIVE    Bilirubin Urine NEGATIVE  NEGATIVE    Ketones, ur NEGATIVE  NEGATIVE (mg/dL)   Protein, ur NEGATIVE  NEGATIVE (mg/dL)   Urobilinogen, UA 0.2  0.0 - 1.0 (mg/dL)   Nitrite NEGATIVE  NEGATIVE    Leukocytes, UA NEGATIVE  NEGATIVE     MAU Course  Procedures U/A  MDM Benadryl 25 mg PO Reglan 10 mg PO Decadron 1 mg PO  Discussed POC with Dr. Clearance Coots and he agrees with plan to prescribe Tylenol #3 and discharge with f/u at [facility]  Assessment and Plan  A: Migraine Chronic hypertension  P: D/C home  Continue 24 hour urine (urine from U/A today returned to pt) Prescription for Tylenol #3 Return to MAU with worsening of  symptoms   Griffin, Sydney Griffin 09/29/2011, 9:10 PM

## 2011-09-29 NOTE — Progress Notes (Signed)
C/o headache for 3-4 days;

## 2011-09-29 NOTE — ED Notes (Signed)
Pt c/o headache for 3-4 days; hx of migraines and hx of hypertension; pt is suppose to be collecting a 24 hour urine- suppose to take urine into the doctor's office in the AM; Some swelling in both feet;

## 2011-10-08 ENCOUNTER — Inpatient Hospital Stay (HOSPITAL_COMMUNITY)
Admission: AD | Admit: 2011-10-08 | Discharge: 2011-10-08 | Disposition: A | Discharge status: Home / Self Care | Payer: Medicaid Other | Source: Ambulatory Visit | Attending: Obstetrics & Gynecology | Admitting: Obstetrics & Gynecology

## 2011-10-08 ENCOUNTER — Encounter (HOSPITAL_COMMUNITY): Payer: Self-pay | Admitting: *Deleted

## 2011-10-08 DIAGNOSIS — A088 Other specified intestinal infections: Secondary | ICD-10-CM | POA: Insufficient documentation

## 2011-10-08 DIAGNOSIS — O99891 Other specified diseases and conditions complicating pregnancy: Secondary | ICD-10-CM | POA: Insufficient documentation

## 2011-10-08 DIAGNOSIS — A09 Infectious gastroenteritis and colitis, unspecified: Secondary | ICD-10-CM

## 2011-10-08 DIAGNOSIS — A084 Viral intestinal infection, unspecified: Secondary | ICD-10-CM

## 2011-10-08 DIAGNOSIS — R109 Unspecified abdominal pain: Secondary | ICD-10-CM | POA: Insufficient documentation

## 2011-10-08 LAB — URINALYSIS, ROUTINE W REFLEX MICROSCOPIC
Bilirubin Urine: NEGATIVE
Nitrite: NEGATIVE
Protein, ur: 30 mg/dL — AB
Specific Gravity, Urine: 1.02 (ref 1.005–1.030)
Urobilinogen, UA: 4 mg/dL — ABNORMAL HIGH (ref 0.0–1.0)

## 2011-10-08 LAB — COMPREHENSIVE METABOLIC PANEL
Alkaline Phosphatase: 57 U/L (ref 39–117)
BUN: 7 mg/dL (ref 6–23)
CO2: 25 mEq/L (ref 19–32)
Chloride: 95 mEq/L — ABNORMAL LOW (ref 96–112)
Creatinine, Ser: 0.73 mg/dL (ref 0.50–1.10)
GFR calc Af Amer: >90 mL/min (ref >90)
GFR calc non Af Amer: >90 mL/min (ref >90)
Glucose, Bld: 77 mg/dL (ref 70–99)
Potassium: 3.7 mEq/L (ref 3.5–5.1)
Total Bilirubin: 0.6 mg/dL (ref 0.3–1.2)

## 2011-10-08 LAB — CBC
HCT: 33.4 % — ABNORMAL LOW (ref 36.0–46.0)
Hemoglobin: 11.3 g/dL — ABNORMAL LOW (ref 12.0–15.0)
MCV: 92.5 fL (ref 78.0–100.0)
RDW: 13.8 % (ref 11.5–15.5)
WBC: 7.6 10*3/uL (ref 4.0–10.5)

## 2011-10-08 LAB — URINE MICROSCOPIC-ADD ON

## 2011-10-08 MED ORDER — PROMETHAZINE HCL 25 MG PO TABS: 25.0000 mg | ORAL_TABLET | Freq: Four times a day (QID) | ORAL | Status: DC | PRN

## 2011-10-08 MED ORDER — PROMETHAZINE HCL 25 MG/ML IJ SOLN
25.0000 mg | Freq: Once | INTRAMUSCULAR | Status: AC
  Administered 2011-10-08: 25 mg via INTRAMUSCULAR
  Filled 2011-10-08: qty 1

## 2011-10-08 NOTE — Progress Notes (Signed)
Patient states she started having nausea and vomiting yesterday, unable to keep anything down. Started having lower abdominal cramping at the same time. No bleeding.

## 2011-10-08 NOTE — ED Provider Notes (Signed)
History     Chief Complaint  Patient presents with  . Emesis During Pregnancy  . Abdominal Pain   HPI Sydney Griffin is a G3P1011 with a histroy of pre-gestational hypertension who presents today at 15 weeks  3 days with cc of lower abdominal pain, nausea, and vomiting.  She states that the N/V is new and she has not had this complaint in current or previous pregnancies.  Yesterday morning she began to experience n/v.  She has had difficulty keeping foods and medications down, but has been able to keep down small sips if water.  Yesterday evening she also began having pain in her lower abdomen which she describes as dull, intermittent, shifting from left to right side, and rates it at a 9/10.   She tried tylenol with codeine, but states that it did not help.  This morning she had one episode of loose stool. She Denies Fever, chills, sick contacts, history of travel or dietary change, Chest pain, dyspnea, foul smelling urine, urinary frequency, burning with urination, vaginal discharge, blood or fluid form vagina.  OB History    Grav Para Term Preterm Abortions TAB SAB Ect Mult Living   3 1 1  0 1   1  1        Past Medical History  Diagnosis Date  . Hypertension   . Acid reflux   . Menstrual bleeding problem   . Headache     Past Surgical History  Procedure Date  . Cesarean section   . Dilation and curettage of uterus   . Laparoscopy     Family History  Problem Relation Age of Onset  . Anesthesia problems Neg Hx     History  Substance Use Topics  . Smoking status: Current Everyday Smoker -- 0.5 packs/day    Types: Cigarettes  . Smokeless tobacco: Never Used  . Alcohol Use: No    Allergies:  Allergies  Allergen Reactions  . Aspirin Shortness Of Breath  . Dilaudid (Hydromorphone Hcl) Other (See Comments)    Pt says medication causes her to shake uncontrollably.  . Ibuprofen     Gi upset    Prescriptions prior to admission  Medication Sig Dispense Refill  .  acetaminophen (TYLENOL) 500 MG tablet Take 1,000 mg by mouth every 6 (six) hours as needed. For pain.      Marland Kitchen acetaminophen-codeine (TYLENOL #3) 300-30 MG per tablet Take 1 tablet by mouth every 4 (four) hours as needed for pain.  10 tablet  0  . esomeprazole (NEXIUM) 40 MG capsule Take 40 mg by mouth daily before breakfast.       . nebivolol (BYSTOLIC) 10 MG tablet Take 10 mg by mouth daily.      . Prenatal Vit-Fe Fumarate-FA (PRENATAL MULTIVITAMIN) TABS Take 1 tablet by mouth daily.        Review of Systems  All other systems reviewed and are negative.   Physical Exam   Blood pressure 131/81, pulse 77, temperature 98.7 F (37.1 C), temperature source Oral, resp. rate 20, height 5\' 8"  (1.727 m), weight 162.478 kg (358 lb 3.2 oz), last menstrual period 06/22/2011, SpO2 97.00%.  Physical Exam  Constitutional: She appears well-developed.       obese  HENT:  Head: Normocephalic.  Eyes: EOM are normal. Pupils are equal, round, and reactive to light.  Cardiovascular: Normal rate and regular rhythm.   Respiratory: Effort normal.       Slight expiratory wheezes bilaterally  GI: Soft.  Hyperactive bowel sounds, pain to palpation in suprapubic region  Neurological: She is alert.    MAU Course  Procedures  MDM Doppler- fetal heart rate 150  Assessment and Plan  1)Likely Viral gastroenteritis - Patient sent home with phenergan.  She is instructed to take otc antidiarrheals  Sydney Griffin 10/08/2011, 10:53 AM   I was present for the exam and agree with the assessment and plan.  Sydney Griffin. Sydney Griffin, DrHSc, MPAS, PA-C   Sydney Hoover, PA 10/08/11 1332

## 2011-10-14 LAB — OB RESULTS CONSOLE HGB/HCT, BLOOD
HCT: 37 %
Hemoglobin: 12.2 g/dL

## 2011-10-14 LAB — OB RESULTS CONSOLE PLATELET COUNT: Platelets: 235 10*3/uL

## 2012-01-06 DIAGNOSIS — J45909 Unspecified asthma, uncomplicated: Secondary | ICD-10-CM

## 2012-01-06 HISTORY — DX: Unspecified asthma, uncomplicated: J45.909

## 2012-01-12 ENCOUNTER — Inpatient Hospital Stay (HOSPITAL_COMMUNITY)
Admission: AD | Admit: 2012-01-12 | Discharge: 2012-01-12 | Disposition: A | Discharge status: Home / Self Care | Payer: Medicaid Other | Source: Ambulatory Visit | Attending: Obstetrics & Gynecology | Admitting: Obstetrics & Gynecology

## 2012-01-12 ENCOUNTER — Encounter (HOSPITAL_COMMUNITY): Payer: Self-pay | Admitting: *Deleted

## 2012-01-12 ENCOUNTER — Inpatient Hospital Stay (HOSPITAL_COMMUNITY): Payer: Medicaid Other

## 2012-01-12 DIAGNOSIS — O99891 Other specified diseases and conditions complicating pregnancy: Secondary | ICD-10-CM | POA: Insufficient documentation

## 2012-01-12 DIAGNOSIS — O10919 Unspecified pre-existing hypertension complicating pregnancy, unspecified trimester: Secondary | ICD-10-CM

## 2012-01-12 DIAGNOSIS — O10019 Pre-existing essential hypertension complicating pregnancy, unspecified trimester: Secondary | ICD-10-CM | POA: Insufficient documentation

## 2012-01-12 DIAGNOSIS — J22 Unspecified acute lower respiratory infection: Secondary | ICD-10-CM

## 2012-01-12 DIAGNOSIS — J988 Other specified respiratory disorders: Secondary | ICD-10-CM | POA: Insufficient documentation

## 2012-01-12 DIAGNOSIS — J45901 Unspecified asthma with (acute) exacerbation: Secondary | ICD-10-CM | POA: Insufficient documentation

## 2012-01-12 LAB — CBC
HCT: 33.1 % — ABNORMAL LOW (ref 36.0–46.0)
MCH: 31.1 pg (ref 26.0–34.0)
MCV: 94.6 fL (ref 78.0–100.0)
Platelets: 199 10*3/uL (ref 150–400)
RDW: 14 % (ref 11.5–15.5)

## 2012-01-12 LAB — DIFFERENTIAL
Eosinophils Relative: 1 % (ref 0–5)
Lymphocytes Relative: 13 % (ref 12–46)
Monocytes Absolute: 1 10*3/uL (ref 0.1–1.0)
Monocytes Relative: 7 % (ref 3–12)
Neutro Abs: 12.2 10*3/uL — ABNORMAL HIGH (ref 1.7–7.7)

## 2012-01-12 LAB — COMPREHENSIVE METABOLIC PANEL
ALT: 11 U/L (ref 0–35)
AST: 20 U/L (ref 0–37)
Albumin: 2.6 g/dL — ABNORMAL LOW (ref 3.5–5.2)
CO2: 25 mEq/L (ref 19–32)
Calcium: 9 mg/dL (ref 8.4–10.5)
Chloride: 103 mEq/L (ref 96–112)
Creatinine, Ser: 0.75 mg/dL (ref 0.50–1.10)
Sodium: 138 mEq/L (ref 135–145)

## 2012-01-12 LAB — URINE MICROSCOPIC-ADD ON

## 2012-01-12 LAB — URINALYSIS, ROUTINE W REFLEX MICROSCOPIC
Ketones, ur: NEGATIVE mg/dL
Leukocytes, UA: NEGATIVE
Protein, ur: 30 mg/dL — AB
Urobilinogen, UA: >8 mg/dL — ABNORMAL HIGH (ref 0.0–1.0)

## 2012-01-12 MED ORDER — METHYLDOPA 500 MG PO TABS: 500.0000 mg | ORAL_TABLET | Freq: Two times a day (BID) | ORAL | Status: DC

## 2012-01-12 MED ORDER — ALBUTEROL SULFATE (5 MG/ML) 0.5% IN NEBU
2.5000 mg | INHALATION_SOLUTION | Freq: Once | RESPIRATORY_TRACT | Status: AC
  Administered 2012-01-12: 2.5 mg via RESPIRATORY_TRACT
  Filled 2012-01-12: qty 0.5

## 2012-01-12 MED ORDER — GUAIFENESIN ER 600 MG PO TB12: 1200.0000 mg | ORAL_TABLET | Freq: Two times a day (BID) | ORAL | Status: DC

## 2012-01-12 MED ORDER — AZITHROMYCIN 250 MG PO TABS: 250.0000 mg | ORAL_TABLET | Freq: Every day | ORAL | Status: DC

## 2012-01-12 MED ORDER — IPRATROPIUM BROMIDE 0.02 % IN SOLN
0.5000 mg | Freq: Once | RESPIRATORY_TRACT | Status: AC
  Administered 2012-01-12: 0.5 mg via RESPIRATORY_TRACT
  Filled 2012-01-12: qty 2.5

## 2012-01-12 MED ORDER — OXYCODONE-ACETAMINOPHEN 5-325 MG PO TABS: 1.0000 | ORAL_TABLET | ORAL | Status: AC | PRN

## 2012-01-12 MED ORDER — METHYLPREDNISOLONE SODIUM SUCC 125 MG IJ SOLR
160.0000 mg | Freq: Once | INTRAMUSCULAR | Status: AC
  Administered 2012-01-12: 160 mg via INTRAMUSCULAR
  Filled 2012-01-12: qty 4

## 2012-01-12 MED ORDER — METHYLDOPA 500 MG PO TABS
500.0000 mg | ORAL_TABLET | Freq: Once | ORAL | Status: AC
  Administered 2012-01-12: 500 mg via ORAL
  Filled 2012-01-12: qty 1

## 2012-01-12 MED ORDER — AZITHROMYCIN 250 MG PO TABS
500.0000 mg | ORAL_TABLET | Freq: Once | ORAL | Status: AC
  Administered 2012-01-12: 500 mg via ORAL
  Filled 2012-01-12: qty 2

## 2012-01-12 MED ORDER — ALBUTEROL SULFATE HFA 108 (90 BASE) MCG/ACT IN AERS: 2.0000 | INHALATION_SPRAY | Freq: Four times a day (QID) | RESPIRATORY_TRACT | Status: DC | PRN

## 2012-01-12 NOTE — MAU Note (Signed)
Started 3 days ago.  Coughing, which gags her and then she throws up.  Headache started today. Hx of high blood pressure, is on medicine for it.

## 2012-01-12 NOTE — MAU Provider Note (Signed)
History     CSN: [id number]  Arrival date and time: 01/12/12 1033   First Provider Initiated Contact with Patient 01/12/12 1117      Chief Complaint  Patient presents with  . Cough  . Headache   HPI  Patient is 31 y/o AAF with h/o HTN and HA presents today with a 4 day h/o URI symptoms, productive cough with vomiting, and HA. States her HA is only when she coughs and is unlike the HA she gets from high BP. Took Tylenol cold and sinus  at 0555 with little relief of her symptoms. Denies fever although she gets "hot flashes." Had asthma as a kid but does not currently take medication for it.  OB History    Grav Para Term Preterm Abortions TAB SAB Ect Mult Living   3 1 1  0 1   1  1       Past Medical History  Diagnosis Date  . Hypertension   . Acid reflux   . Menstrual bleeding problem   . Headache   . Ectopic pregnancy     Had MTX    Past Surgical History  Procedure Date  . Cesarean section   . Dilation and curettage of uterus   . Laparoscopy     Family History  Problem Relation Age of Onset  . Anesthesia problems Neg Hx     History  Substance Use Topics  . Smoking status: Current Everyday Smoker -- 0.5 packs/day    Types: Cigarettes  . Smokeless tobacco: Never Used  . Alcohol Use: No    Allergies:  Allergies  Allergen Reactions  . Aspirin Shortness Of Breath  . Dilaudid (Hydromorphone Hcl) Other (See Comments)    Pt says medication causes her to shake uncontrollably.  . Ibuprofen     Gi upset    Prescriptions prior to admission  Medication Sig Dispense Refill  . acetaminophen (TYLENOL) 500 MG tablet Take 1,000 mg by mouth every 6 (six) hours as needed. For pain.      Marland Kitchen esomeprazole (NEXIUM) 40 MG capsule Take 40 mg by mouth daily before breakfast.       . menthol-cetylpyridinium (CEPACOL) 3 MG lozenge Take 1 lozenge by mouth as needed. For sore throat.      . nebivolol (BYSTOLIC) 10 MG tablet Take 10 mg by mouth daily.      . Prenatal Vit-Fe  Fumarate-FA (PRENATAL MULTIVITAMIN) TABS Take 1 tablet by mouth daily.        Review of Systems  Constitutional: Negative for fever and chills.  Gastrointestinal: Positive for vomiting. Negative for nausea, abdominal pain, diarrhea, blood in stool and melena.  Genitourinary: Negative for dysuria, urgency, frequency, hematuria and flank pain.  Neurological: Negative for headaches.   Physical Exam   Blood pressure 152/84, pulse 75, temperature 98.7 F (37.1 C), temperature source Oral, resp. rate 20, height 5' 8.5" (1.74 m), weight 165.109 kg (364 lb), last menstrual period 06/22/2011, SpO2 98.00%. Patient Vitals for the past 24 hrs:  BP Temp Temp src Pulse Resp SpO2 Height Weight  01/12/12 1628 156/98 mmHg - - 96  20  - - -  01/12/12 1455 158/90 mmHg - - 95  18  98 % - -  01/12/12 1252 125/51 mmHg - - 79  18  96 % - -  01/12/12 1211 148/80 mmHg - - 80  18  - - -  01/12/12 1152 155/94 mmHg - - 81  18  - - -  01/12/12 1141 - - -  75  - 98 % - -  01/12/12 1136 - - - 81  - 97 % - -  01/12/12 1131 - - - 79  - 95 % - -  01/12/12 1126 - - - 81  - 96 % - -  01/12/12 1121 - - - 86  - 96 % - -  01/12/12 1116 - - - 86  - 95 % - -  01/12/12 1111 - - - 87  - 96 % - -  01/12/12 1106 - - - 87  - 95 % - -  01/12/12 1101 - - - 87  - 94 % - -  01/12/12 1050 152/84 mmHg 98.7 F (37.1 C) Oral 83  20  95 % 5' 8.5" (1.74 m) 165.109 kg (364 lb)   Physical Exam GENERAL: 31 y/o  G3 P1 101 Obese. Alert and oriented to person, time and place.   HEENT:  HEAD: normocephalic; atraumatic EYES: EOMI; PEERL 4mm.   NOSE: Nasal turbinates enlarged bilaterally Lt> Rt with mild rhinorrhea present.  EARS: TMs pearly grey with good cone of light present bilaterally. EAC clear.  MOUTH/THROAT: buccal mucosa pink, moist, no visible lesions or tonsillary exudates.   Neck: Supple, non tender. Trachea midline. no lymphadenopathy. No carotid bruits.   PULM: Diffuse expiratory and inspiratory wheezes and rhonchi. Good  chest rise and expansion.  CV: RRR. Normal S1/S2. No murmurs, rubs, or gallops. + 2 pulses in all extremities. Brisk cap refill. Palmar erythema present bilaterally.    Abd: Soft, non tender. BS present in all four quadrants.  NEURO: Distal sensory and motor intact.  MAU Course  Procedures  Results for orders placed during the hospital encounter of 01/12/12 (from the past 24 hour(s))  URINALYSIS, ROUTINE W REFLEX MICROSCOPIC     Status: Abnormal   Collection Time   01/12/12 12:09 PM      Component Value Range   Color, Urine BROWN (*) YELLOW    APPearance CLEAR  CLEAR    Specific Gravity, Urine 1.015  1.005 - 1.030    pH 7.0  5.0 - 8.0    Glucose, UA 100 (*) NEGATIVE (mg/dL)   Hgb urine dipstick NEGATIVE  NEGATIVE    Bilirubin Urine SMALL (*) NEGATIVE    Ketones, ur NEGATIVE  NEGATIVE (mg/dL)   Protein, ur 30 (*) NEGATIVE (mg/dL)   Urobilinogen, UA >1.6 (*) 0.0 - 1.0 (mg/dL)   Nitrite NEGATIVE  NEGATIVE    Leukocytes, UA NEGATIVE  NEGATIVE   URINE MICROSCOPIC-ADD ON     Status: Abnormal   Collection Time   01/12/12 12:09 PM      Component Value Range   Squamous Epithelial / LPF MANY (*) RARE    WBC, UA 0-2  <3 (WBC/hpf)   Urine-Other MUCOUS PRESENT    CBC     Status: Abnormal   Collection Time   01/12/12  1:46 PM      Component Value Range   WBC 15.5 (*) 4.0 - 10.5 (K/uL)   RBC 3.50 (*) 3.87 - 5.11 (MIL/uL)   Hemoglobin 10.9 (*) 12.0 - 15.0 (g/dL)   HCT 10.9 (*) 60.4 - 46.0 (%)   MCV 94.6  78.0 - 100.0 (fL)   MCH 31.1  26.0 - 34.0 (pg)   MCHC 32.9  30.0 - 36.0 (g/dL)   RDW 54.0  98.1 - 19.1 (%)   Platelets 199  150 - 400 (K/uL)  COMPREHENSIVE METABOLIC PANEL     Status: Abnormal   Collection  Time   01/12/12  1:46 PM      Component Value Range   Sodium 138  135 - 145 (mEq/L)   Potassium 3.6  3.5 - 5.1 (mEq/L)   Chloride 103  96 - 112 (mEq/L)   CO2 25  19 - 32 (mEq/L)   Glucose, Bld 75  70 - 99 (mg/dL)   BUN 6  6 - 23 (mg/dL)   Creatinine, Ser 1.61  0.50 - 1.10 (mg/dL)     Calcium 9.0  8.4 - 10.5 (mg/dL)   Total Protein 5.9 (*) 6.0 - 8.3 (g/dL)   Albumin 2.6 (*) 3.5 - 5.2 (g/dL)   AST 20  0 - 37 (U/L)   ALT 11  0 - 35 (U/L)   Alkaline Phosphatase 87  39 - 117 (U/L)   Total Bilirubin 0.6  0.3 - 1.2 (mg/dL)   GFR calc non Af Amer >90  >90 (mL/min)   GFR calc Af Amer >90  >90 (mL/min)  DIFFERENTIAL     Status: Abnormal   Collection Time   01/12/12  1:46 PM      Component Value Range   Neutrophils Relative 79 (*) 43 - 77 (%)   Neutro Abs 12.2 (*) 1.7 - 7.7 (K/uL)   Lymphocytes Relative 13  12 - 46 (%)   Lymphs Abs 1.9  0.7 - 4.0 (K/uL)   Monocytes Relative 7  3 - 12 (%)   Monocytes Absolute 1.0  0.1 - 1.0 (K/uL)   Eosinophils Relative 1  0 - 5 (%)   Eosinophils Absolute 0.2  0.0 - 0.7 (K/uL)   Basophils Relative 0  0 - 1 (%)   Basophils Absolute 0.0  0.0 - 0.1 (K/uL)   Wheezing, respiratory effort and O2 sats improved after two nebulizer Tx Albuterol x 2 and Atrovent x 1.  Dg Chest 2 View  01/12/2012  *RADIOLOGY REPORT*  Clinical Data: Cough, wheezing, shortness of breath, [redacted] weeks pregnant  CHEST - 2 VIEW  Comparison: November 28, 2010  Findings: Mild cardiomegaly is present.  The mediastinum and pulmonary vasculature are within normal limits.  Both lungs are clear. The osseous structures are unremarkable.  IMPRESSION: Mild cardiomegaly.  No evidence of pulmonary edema or infiltrate.  Original Report Authenticated By: Brandon Melnick, M.D.   Given [place].  Assessment and Plan   1. Asthma exacerbation   2. Acute lower respiratory infection   3. Chronic hypertension complicating or reason for care during pregnancy    Plan: D/C Bystolic. Start [place]  Medication List  As of 01/12/2012 11:10 PM   START taking these medications         albuterol 108 (90 BASE) MCG/ACT inhaler   Commonly known as: PROVENTIL HFA;VENTOLIN HFA   Inhale 2 puffs into the lungs every 6 (six) hours as needed for wheezing.      azithromycin 250 MG tablet   Commonly known as:  ZITHROMAX   Take 1 tablet (250 mg total) by mouth daily.      guaiFENesin 600 MG 12 hr tablet   Commonly known as: MUCINEX   Take 2 tablets (1,200 mg total) by mouth 2 (two) times daily.      methyldopa 500 MG tablet   Commonly known as: ALDOMET   Take 1 tablet (500 mg total) by mouth 2 (two) times daily.      oxyCODONE-acetaminophen 5-325 MG per tablet   Commonly known as: PERCOCET   Take 1 tablet by mouth every 4 (four) hours  as needed for pain.         CONTINUE taking these medications         acetaminophen 500 MG tablet   Commonly known as: TYLENOL      esomeprazole 40 MG capsule   Commonly known as: NEXIUM      menthol-cetylpyridinium 3 MG lozenge   Commonly known as: CEPACOL      prenatal multivitamin Tabs         STOP taking these medications         nebivolol 10 MG tablet          Where to get your medications    These are the prescriptions that you need to pick up.   You may get these medications from any pharmacy.         albuterol 108 (90 BASE) MCG/ACT inhaler   azithromycin 250 MG tablet   guaiFENesin 600 MG 12 hr tablet   methyldopa 500 MG tablet   oxyCODONE-acetaminophen 5-325 MG per tablet          Has scheduled F/U appointment w/ Dr. Tamela Oddi tomorrow for [place]. Increase rest and fluids  [place], Jilda Panda 01/12/2012, 11:51 AM   I was present for the exam and agree with above.  Katrinka Blazing, Lezley Bedgood 01/12/2012 1:12 PM

## 2012-03-14 ENCOUNTER — Other Ambulatory Visit: Payer: Self-pay | Admitting: *Deleted

## 2012-03-21 ENCOUNTER — Encounter (HOSPITAL_COMMUNITY): Payer: Self-pay | Admitting: Pharmacist

## 2012-03-24 ENCOUNTER — Encounter (HOSPITAL_COMMUNITY): Payer: Self-pay

## 2012-03-25 ENCOUNTER — Encounter (HOSPITAL_COMMUNITY)
Admission: RE | Admit: 2012-03-25 | Discharge: 2012-03-25 | Disposition: A | Discharge status: Home / Self Care | Payer: Medicaid Other | Source: Ambulatory Visit | Attending: Obstetrics & Gynecology | Admitting: Obstetrics & Gynecology

## 2012-03-25 ENCOUNTER — Encounter (HOSPITAL_COMMUNITY): Payer: Self-pay

## 2012-03-25 HISTORY — DX: Unspecified asthma, uncomplicated: J45.909

## 2012-03-25 LAB — CBC
MCHC: 33.8 g/dL (ref 30.0–36.0)
Platelets: 207 10*3/uL (ref 150–400)
RDW: 13.8 % (ref 11.5–15.5)
WBC: 13.2 10*3/uL — ABNORMAL HIGH (ref 4.0–10.5)

## 2012-03-25 LAB — SURGICAL PCR SCREEN
MRSA, PCR: NEGATIVE
Staphylococcus aureus: NEGATIVE

## 2012-03-25 LAB — PREPARE RBC (CROSSMATCH)

## 2012-03-25 NOTE — Patient Instructions (Addendum)
YOUR PROCEDURE IS SCHEDULED ON:03/28/12  ENTER THROUGH THE MAIN ENTRANCE OF [facility] AT:6am  USE DESK PHONE AND DIAL [phone] TO INFORM us OF YOUR ARRIVAL  CALL [phone] IF YOU HAVE ANY QUESTIONS OR PROBLEMS PRIOR TO YOUR ARRIVAL.  REMEMBER: DO NOT EAT OR DRINK AFTER MIDNIGHT :Sunday   SPECIAL INSTRUCTIONS:water ok until 3:30 am on Monday   YOU MAY BRUSH YOUR TEETH THE MORNING OF SURGERY   TAKE THESE MEDICINES THE DAY OF SURGERY WITH SIP OF WATER: Bystolic, Nexium   DO NOT WEAR JEWELRY, EYE MAKEUP, LIPSTICK OR DARK FINGERNAIL POLISH DO NOT WEAR LOTIONS  DO NOT SHAVE FOR 48 HOURS PRIOR TO SURGERY

## 2012-03-27 ENCOUNTER — Encounter (HOSPITAL_COMMUNITY): Payer: Self-pay | Admitting: Obstetrics & Gynecology

## 2012-03-27 DIAGNOSIS — O10019 Pre-existing essential hypertension complicating pregnancy, unspecified trimester: Secondary | ICD-10-CM | POA: Diagnosis present

## 2012-03-27 DIAGNOSIS — O34219 Maternal care for unspecified type scar from previous cesarean delivery: Secondary | ICD-10-CM

## 2012-03-27 LAB — TYPE AND SCREEN
ABO/RH(D): O POS
Antibody Screen: NEGATIVE
Unit division: 0
Unit division: 0

## 2012-03-27 NOTE — H&P (Signed)
Sydney Griffin is a 31 y.o. female presenting for an elective repeat C/D. Maternal Medical History:  Reason for admission: For elective repeat C/D.  Pregnancy complicated by h/o essential hypertension.  Fetal activity: Perceived fetal activity is normal.    Prenatal complications: Hypertension.   Substance abuse: tobacco.     OB History    Grav Para Term Preterm Abortions TAB SAB Ect Mult Living   3 1 1  0 1   1  1      Past Medical History  Diagnosis Date  . Acid reflux   . Menstrual bleeding problem   . Headache   . Ectopic pregnancy     Had MTX  . Asthma May 2013    info from [facility] states asthma but pt denies and said she had bronchitis, used an Hydrologist while sick and has now lost the inhaler.  . Hypertension    Past Surgical History  Procedure Date  . Cesarean section   . Dilation and curettage of uterus   . Laparoscopy    Family History: family history is negative for Anesthesia problems. Social History:  reports that she has been smoking Cigarettes.  She has been smoking about .5 packs per day. She has never used smokeless tobacco. She reports that she does not drink alcohol or use illicit drugs.    Review of Systems  Constitutional: Negative for fever.  Eyes: Negative for blurred vision.  Respiratory: Negative for shortness of breath.   Gastrointestinal: Negative for vomiting.  Skin: Negative for rash.  Neurological: Negative for headaches.      Last menstrual period 06/22/2011. Maternal Exam:  Abdomen: Patient reports no abdominal tenderness. Introitus: not evaluated.   Cervix: not evaluated.   Fetal Exam Fetal Monitor Review: Mode: hand-held doppler probe.       Physical Exam  Constitutional: She appears well-developed.  HENT:  Head: Normocephalic.  Neck: Neck supple. No thyromegaly present.  Cardiovascular: Normal rate and regular rhythm.   Respiratory: Breath sounds normal.  GI: Soft. Bowel sounds are normal.  Skin: No rash noted.      Prenatal labs: ABO, Rh: --/--/O POS (07/19 1118) Antibody: NEG (07/19 1118) Rubella:   RPR: NON REACTIVE (07/19 1118)  HBsAg: Negative (12/10 0000)  HIV: Non-reactive (12/10 0000) :     Assessment/Plan: 31 y.o. with and IUP @ [redacted]w[redacted]d .  H/O previous C/D.  Declines TOLAC.  Pregnancy complicated by well-controlled chronic HTN.  Morbid obesity.  Admit  Repeat C/D   JACKSON-MOORE,Ruey Storer A 03/27/2012, 12:26 PM

## 2012-03-28 ENCOUNTER — Encounter (HOSPITAL_COMMUNITY): Payer: Self-pay | Admitting: Anesthesiology

## 2012-03-28 ENCOUNTER — Inpatient Hospital Stay (HOSPITAL_COMMUNITY): Payer: Medicaid Other | Admitting: Anesthesiology

## 2012-03-28 ENCOUNTER — Encounter (HOSPITAL_COMMUNITY)
Admission: RE | Disposition: A | Discharge status: Home / Self Care | Payer: Self-pay | Source: Ambulatory Visit | Attending: Obstetrics & Gynecology

## 2012-03-28 ENCOUNTER — Encounter (HOSPITAL_COMMUNITY): Payer: Self-pay

## 2012-03-28 ENCOUNTER — Inpatient Hospital Stay (HOSPITAL_COMMUNITY)
Admission: RE | Admit: 2012-03-28 | Discharge: 2012-03-31 | DRG: 765 | Disposition: A | Discharge status: Home / Self Care | Payer: Medicaid Other | Source: Ambulatory Visit | Attending: Obstetrics & Gynecology | Admitting: Obstetrics & Gynecology

## 2012-03-28 DIAGNOSIS — Z01818 Encounter for other preprocedural examination: Secondary | ICD-10-CM

## 2012-03-28 DIAGNOSIS — O10019 Pre-existing essential hypertension complicating pregnancy, unspecified trimester: Secondary | ICD-10-CM | POA: Diagnosis present

## 2012-03-28 DIAGNOSIS — E669 Obesity, unspecified: Secondary | ICD-10-CM | POA: Diagnosis present

## 2012-03-28 DIAGNOSIS — O34219 Maternal care for unspecified type scar from previous cesarean delivery: Secondary | ICD-10-CM

## 2012-03-28 DIAGNOSIS — Z01812 Encounter for preprocedural laboratory examination: Secondary | ICD-10-CM

## 2012-03-28 DIAGNOSIS — Z98891 History of uterine scar from previous surgery: Secondary | ICD-10-CM

## 2012-03-28 DIAGNOSIS — O1002 Pre-existing essential hypertension complicating childbirth: Secondary | ICD-10-CM | POA: Diagnosis present

## 2012-03-28 LAB — PREPARE RBC (CROSSMATCH)

## 2012-03-28 SURGERY — Surgical Case
Anesthesia: Spinal | Site: Abdomen | Wound class: Clean Contaminated

## 2012-03-28 MED ORDER — ONDANSETRON HCL 4 MG/2ML IJ SOLN: 4.0000 mg | Freq: Three times a day (TID) | INTRAMUSCULAR | Status: DC | PRN

## 2012-03-28 MED ORDER — TETANUS-DIPHTH-ACELL PERTUSSIS 5-2.5-18.5 LF-MCG/0.5 IM SUSP
0.5000 mL | Freq: Once | INTRAMUSCULAR | Status: AC
Start: 2012-03-29 — End: 2012-03-29
  Administered 2012-03-29: 0.5 mL via INTRAMUSCULAR
  Filled 2012-03-28: qty 0.5

## 2012-03-28 MED ORDER — MEPERIDINE HCL 25 MG/ML IJ SOLN: 6.2500 mg | INTRAMUSCULAR | Status: DC | PRN

## 2012-03-28 MED ORDER — EPHEDRINE SULFATE 50 MG/ML IJ SOLN
INTRAMUSCULAR | Status: DC | PRN
  Administered 2012-03-28 (×2): 10 mg via INTRAVENOUS

## 2012-03-28 MED ORDER — OXYTOCIN 40 UNITS IN LACTATED RINGERS INFUSION - SIMPLE MED: 62.5000 mL/h | INTRAVENOUS | Status: AC

## 2012-03-28 MED ORDER — FENTANYL CITRATE 0.05 MG/ML IJ SOLN
INTRAMUSCULAR | Status: DC | PRN
  Administered 2012-03-28: 15 ug via INTRATHECAL

## 2012-03-28 MED ORDER — WITCH HAZEL-GLYCERIN EX PADS: 1.0000 "application " | MEDICATED_PAD | CUTANEOUS | Status: DC | PRN

## 2012-03-28 MED ORDER — ZOLPIDEM TARTRATE 5 MG PO TABS: 5.0000 mg | ORAL_TABLET | Freq: Every evening | ORAL | Status: DC | PRN

## 2012-03-28 MED ORDER — MAGNESIUM HYDROXIDE 400 MG/5ML PO SUSP
30.0000 mL | ORAL | Status: DC | PRN
  Administered 2012-03-30: 30 mL via ORAL
  Filled 2012-03-28 (×2): qty 30

## 2012-03-28 MED ORDER — PHENYLEPHRINE 40 MCG/ML (10ML) SYRINGE FOR IV PUSH (FOR BLOOD PRESSURE SUPPORT)
PREFILLED_SYRINGE | INTRAVENOUS | Status: AC
  Filled 2012-03-28: qty 5

## 2012-03-28 MED ORDER — FERROUS SULFATE 325 (65 FE) MG PO TABS
325.0000 mg | ORAL_TABLET | Freq: Two times a day (BID) | ORAL | Status: DC
  Administered 2012-03-28 – 2012-03-31 (×6): 325 mg via ORAL
  Filled 2012-03-28 (×6): qty 1

## 2012-03-28 MED ORDER — EPHEDRINE 5 MG/ML INJ
INTRAVENOUS | Status: AC
  Filled 2012-03-28: qty 10

## 2012-03-28 MED ORDER — DIPHENHYDRAMINE HCL 50 MG/ML IJ SOLN: 25.0000 mg | INTRAMUSCULAR | Status: DC | PRN

## 2012-03-28 MED ORDER — NALBUPHINE HCL 10 MG/ML IJ SOLN
5.0000 mg | INTRAMUSCULAR | Status: DC | PRN
Start: 2012-03-28 — End: 2012-03-31
  Administered 2012-03-28 – 2012-03-29 (×2): 10 mg via INTRAVENOUS
  Filled 2012-03-28: qty 1

## 2012-03-28 MED ORDER — LACTATED RINGERS IV SOLN
INTRAVENOUS | Status: DC
  Administered 2012-03-28 (×4): via INTRAVENOUS

## 2012-03-28 MED ORDER — PHENYLEPHRINE HCL 10 MG/ML IJ SOLN
INTRAMUSCULAR | Status: DC | PRN
  Administered 2012-03-28: 40 ug via INTRAVENOUS
  Administered 2012-03-28: 120 ug via INTRAVENOUS
  Administered 2012-03-28: 80 ug via INTRAVENOUS

## 2012-03-28 MED ORDER — METOCLOPRAMIDE HCL 5 MG/ML IJ SOLN: 10.0000 mg | Freq: Once | INTRAMUSCULAR | Status: DC | PRN

## 2012-03-28 MED ORDER — METOCLOPRAMIDE HCL 5 MG/ML IJ SOLN: 10.0000 mg | Freq: Three times a day (TID) | INTRAMUSCULAR | Status: DC | PRN

## 2012-03-28 MED ORDER — ACETAMINOPHEN 10 MG/ML IV SOLN
1000.0000 mg | Freq: Four times a day (QID) | INTRAVENOUS | Status: DC
  Administered 2012-03-28: 1000 mg via INTRAVENOUS
  Filled 2012-03-28 (×3): qty 100

## 2012-03-28 MED ORDER — DIPHENHYDRAMINE HCL 25 MG PO CAPS
25.0000 mg | ORAL_CAPSULE | ORAL | Status: DC | PRN
  Administered 2012-03-29: 25 mg via ORAL
  Filled 2012-03-28: qty 1

## 2012-03-28 MED ORDER — FENTANYL CITRATE 0.05 MG/ML IJ SOLN
25.0000 ug | INTRAMUSCULAR | Status: DC | PRN
  Administered 2012-03-28 (×2): 50 ug via INTRAVENOUS

## 2012-03-28 MED ORDER — SODIUM CHLORIDE 0.9 % IV SOLN
1.0000 ug/kg/h | INTRAVENOUS | Status: DC | PRN
  Filled 2012-03-28: qty 2.5

## 2012-03-28 MED ORDER — ONDANSETRON HCL 4 MG PO TABS
4.0000 mg | ORAL_TABLET | ORAL | Status: DC | PRN
  Administered 2012-03-30: 4 mg via ORAL
  Filled 2012-03-28: qty 1

## 2012-03-28 MED ORDER — MORPHINE SULFATE 0.5 MG/ML IJ SOLN
INTRAMUSCULAR | Status: AC
  Filled 2012-03-28: qty 10

## 2012-03-28 MED ORDER — SCOPOLAMINE 1 MG/3DAYS TD PT72
MEDICATED_PATCH | TRANSDERMAL | Status: AC
  Administered 2012-03-28: 1.5 mg via TRANSDERMAL
  Filled 2012-03-28: qty 1

## 2012-03-28 MED ORDER — ONDANSETRON HCL 4 MG/2ML IJ SOLN: 4.0000 mg | INTRAMUSCULAR | Status: DC | PRN

## 2012-03-28 MED ORDER — OXYTOCIN 10 UNIT/ML IJ SOLN
INTRAMUSCULAR | Status: AC
  Filled 2012-03-28: qty 4

## 2012-03-28 MED ORDER — NALBUPHINE HCL 10 MG/ML IJ SOLN
5.0000 mg | INTRAMUSCULAR | Status: DC | PRN
Start: 2012-03-28 — End: 2012-03-31
  Filled 2012-03-28 (×3): qty 1

## 2012-03-28 MED ORDER — NALOXONE HCL 0.4 MG/ML IJ SOLN: 0.4000 mg | INTRAMUSCULAR | Status: DC | PRN

## 2012-03-28 MED ORDER — FENTANYL CITRATE 0.05 MG/ML IJ SOLN
INTRAMUSCULAR | Status: AC
  Filled 2012-03-28: qty 2

## 2012-03-28 MED ORDER — CEFAZOLIN SODIUM 10 G IJ SOLR
3.0000 g | INTRAMUSCULAR | Status: AC
Start: 2012-03-28 — End: 2012-03-28
  Administered 2012-03-28: 3 g via INTRAVENOUS
  Filled 2012-03-28: qty 3000

## 2012-03-28 MED ORDER — LACTATED RINGERS IV SOLN
INTRAVENOUS | Status: DC
  Administered 2012-03-29: 01:00:00 via INTRAVENOUS

## 2012-03-28 MED ORDER — OXYTOCIN 10 UNIT/ML IJ SOLN
40.0000 [IU] | INTRAVENOUS | Status: DC | PRN
  Administered 2012-03-28: 40 [IU] via INTRAVENOUS

## 2012-03-28 MED ORDER — SIMETHICONE 80 MG PO CHEW
80.0000 mg | CHEWABLE_TABLET | ORAL | Status: DC | PRN
  Administered 2012-03-28 – 2012-03-29 (×2): 80 mg via ORAL

## 2012-03-28 MED ORDER — MORPHINE SULFATE (PF) 0.5 MG/ML IJ SOLN
INTRAMUSCULAR | Status: DC | PRN
  Administered 2012-03-28: .1 ug via INTRATHECAL

## 2012-03-28 MED ORDER — DIBUCAINE 1 % RE OINT
1.0000 | TOPICAL_OINTMENT | RECTAL | Status: DC | PRN
Start: 2012-03-28 — End: 2012-03-31

## 2012-03-28 MED ORDER — DIPHENHYDRAMINE HCL 50 MG/ML IJ SOLN: 12.5000 mg | INTRAMUSCULAR | Status: DC | PRN

## 2012-03-28 MED ORDER — SCOPOLAMINE 1 MG/3DAYS TD PT72
1.0000 | MEDICATED_PATCH | Freq: Once | TRANSDERMAL | Status: AC
  Administered 2012-03-28: 1 via TRANSDERMAL
  Administered 2012-03-28: 1.5 mg via TRANSDERMAL

## 2012-03-28 MED ORDER — ONDANSETRON HCL 4 MG/2ML IJ SOLN
INTRAMUSCULAR | Status: DC | PRN
  Administered 2012-03-28: 4 mg via INTRAVENOUS

## 2012-03-28 MED ORDER — DIPHENHYDRAMINE HCL 25 MG PO CAPS: 25.0000 mg | ORAL_CAPSULE | Freq: Four times a day (QID) | ORAL | Status: DC | PRN

## 2012-03-28 MED ORDER — OXYCODONE-ACETAMINOPHEN 5-325 MG PO TABS: 1.0000 | ORAL_TABLET | ORAL | Status: DC | PRN

## 2012-03-28 MED ORDER — LANOLIN HYDROUS EX OINT: 1.0000 "application " | TOPICAL_OINTMENT | CUTANEOUS | Status: DC | PRN

## 2012-03-28 MED ORDER — SENNOSIDES-DOCUSATE SODIUM 8.6-50 MG PO TABS
2.0000 | ORAL_TABLET | Freq: Every day | ORAL | Status: DC
  Administered 2012-03-28 – 2012-03-30 (×3): 2 via ORAL

## 2012-03-28 MED ORDER — ONDANSETRON HCL 4 MG/2ML IJ SOLN
INTRAMUSCULAR | Status: AC
  Filled 2012-03-28: qty 2

## 2012-03-28 MED ORDER — PRENATAL MULTIVITAMIN CH
1.0000 | ORAL_TABLET | Freq: Every day | ORAL | Status: DC
  Administered 2012-03-29 – 2012-03-31 (×3): 1 via ORAL
  Filled 2012-03-28 (×3): qty 1

## 2012-03-28 MED ORDER — MEASLES, MUMPS & RUBELLA VAC ~~LOC~~ INJ
0.5000 mL | INJECTION | Freq: Once | SUBCUTANEOUS | Status: DC
  Filled 2012-03-28: qty 0.5

## 2012-03-28 MED ORDER — NEBIVOLOL HCL 10 MG PO TABS
10.0000 mg | ORAL_TABLET | Freq: Every day | ORAL | Status: DC
  Administered 2012-03-29 – 2012-03-30 (×2): 10 mg via ORAL
  Filled 2012-03-28 (×3): qty 1

## 2012-03-28 MED ORDER — OXYCODONE-ACETAMINOPHEN 5-325 MG PO TABS
1.0000 | ORAL_TABLET | ORAL | Status: DC | PRN
  Administered 2012-03-28 – 2012-03-31 (×11): 2 via ORAL
  Filled 2012-03-28 (×11): qty 2

## 2012-03-28 MED ORDER — BUPIVACAINE IN DEXTROSE 0.75-8.25 % IT SOLN
INTRATHECAL | Status: DC | PRN
  Administered 2012-03-28: 1.5 mg via INTRATHECAL

## 2012-03-28 MED ORDER — FENTANYL CITRATE 0.05 MG/ML IJ SOLN
INTRAMUSCULAR | Status: AC
  Administered 2012-03-28: 50 ug via INTRAVENOUS
  Filled 2012-03-28: qty 2

## 2012-03-28 MED ORDER — MEDROXYPROGESTERONE ACETATE 150 MG/ML IM SUSP: 150.0000 mg | INTRAMUSCULAR | Status: DC | PRN

## 2012-03-28 MED ORDER — IBUPROFEN 600 MG PO TABS: 600.0000 mg | ORAL_TABLET | Freq: Four times a day (QID) | ORAL | Status: DC

## 2012-03-28 MED ORDER — FENTANYL CITRATE 0.05 MG/ML IJ SOLN
50.0000 ug | Freq: Once | INTRAMUSCULAR | Status: AC
  Administered 2012-03-28: 50 ug via INTRAVENOUS

## 2012-03-28 MED ORDER — SODIUM CHLORIDE 0.9 % IJ SOLN: 3.0000 mL | INTRAMUSCULAR | Status: DC | PRN

## 2012-03-28 SURGICAL SUPPLY — 43 items
APL SKNCLS STERI-STRIP NONHPOA (GAUZE/BANDAGES/DRESSINGS)
BENZOIN TINCTURE PRP APPL 2/3 (GAUZE/BANDAGES/DRESSINGS) ×1 IMPLANT
CANISTER WOUND CARE 500ML ATS (WOUND CARE) ×1 IMPLANT
CHLORAPREP W/TINT 26ML (MISCELLANEOUS) ×3 IMPLANT
CLOTH BEACON ORANGE TIMEOUT ST (SAFETY) ×2 IMPLANT
CONTAINER PREFILL 10% NBF 15ML (MISCELLANEOUS) IMPLANT
DRESSING TELFA 8X3 (GAUZE/BANDAGES/DRESSINGS) ×1 IMPLANT
DRSG VAC ATS LRG SENSATRAC (GAUZE/BANDAGES/DRESSINGS) ×1 IMPLANT
DRSG VAC ATS MED SENSATRAC (GAUZE/BANDAGES/DRESSINGS) IMPLANT
DRSG VAC ATS SM SENSATRAC (GAUZE/BANDAGES/DRESSINGS) IMPLANT
ELECT REM PT RETURN 9FT ADLT (ELECTROSURGICAL) ×2
ELECTRODE REM PT RTRN 9FT ADLT (ELECTROSURGICAL) ×1 IMPLANT
EXTRACTOR VACUUM M CUP 4 TUBE (SUCTIONS) IMPLANT
GAUZE SPONGE 4X4 12PLY STRL LF (GAUZE/BANDAGES/DRESSINGS) ×4 IMPLANT
GLOVE BIO SURGEON STRL SZ 6.5 (GLOVE) ×4 IMPLANT
GOWN PREVENTION PLUS LG XLONG (DISPOSABLE) ×6 IMPLANT
KIT ABG SYR 3ML LUER SLIP (SYRINGE) ×1 IMPLANT
NDL HYPO 25X5/8 SAFETYGLIDE (NEEDLE) ×1 IMPLANT
NEEDLE HYPO 25X5/8 SAFETYGLIDE (NEEDLE) ×2 IMPLANT
NS IRRIG 1000ML POUR BTL (IV SOLUTION) ×2 IMPLANT
PACK C SECTION WH (CUSTOM PROCEDURE TRAY) ×2 IMPLANT
PAD ABD 7.5X8 STRL (GAUZE/BANDAGES/DRESSINGS) ×2 IMPLANT
RTRCTR C-SECT PINK 25CM LRG (MISCELLANEOUS) IMPLANT
RTRCTR C-SECT PINK 34CM XLRG (MISCELLANEOUS) IMPLANT
SLEEVE SCD COMPRESS KNEE LRG (MISCELLANEOUS) ×1 IMPLANT
SLEEVE SCD COMPRESS KNEE MED (MISCELLANEOUS) IMPLANT
STAPLER VISISTAT 35W (STAPLE) IMPLANT
STRIP CLOSURE SKIN 1/2X4 (GAUZE/BANDAGES/DRESSINGS) ×2 IMPLANT
SUT MNCRL 0 VIOLET CTX 36 (SUTURE) ×2 IMPLANT
SUT MNCRL AB 3-0 PS2 27 (SUTURE) ×2 IMPLANT
SUT MONOCRYL 0 CTX 36 (SUTURE) ×2
SUT PDS AB 0 CTX 36 PDP370T (SUTURE) ×2 IMPLANT
SUT PLAIN 0 NONE (SUTURE) IMPLANT
SUT VIC AB 0 CT1 27 (SUTURE)
SUT VIC AB 0 CT1 27XBRD ANBCTR (SUTURE) IMPLANT
SUT VIC AB 2-0 CT1 (SUTURE) IMPLANT
SUT VIC AB 2-0 CT1 27 (SUTURE) ×2
SUT VIC AB 2-0 CT1 TAPERPNT 27 (SUTURE) ×1 IMPLANT
SUT VIC AB 2-0 SH 27 (SUTURE)
SUT VIC AB 2-0 SH 27XBRD (SUTURE) IMPLANT
TOWEL OR 17X24 6PK STRL BLUE (TOWEL DISPOSABLE) ×4 IMPLANT
TRAY FOLEY CATH 14FR (SET/KITS/TRAYS/PACK) ×2 IMPLANT
WATER STERILE IRR 1000ML POUR (IV SOLUTION) ×2 IMPLANT

## 2012-03-28 NOTE — Anesthesia Procedure Notes (Signed)
Spinal  Patient location during procedure: OR Start time: 03/28/2012 7:51 AM Staffing Performed by: anesthesiologist  Preanesthetic Checklist Completed: patient identified, site marked, surgical consent, pre-op evaluation, timeout performed, IV checked, risks and benefits discussed and monitors and equipment checked Spinal Block Patient position: sitting Prep: site prepped and draped and DuraPrep Patient monitoring: heart rate, cardiac monitor, continuous pulse ox and blood pressure Approach: midline Location: L3-4 Injection technique: single-shot Needle Needle type: Pencan  Needle gauge: 24 G Needle length: 9 cm Assessment Sensory level: T4 Additional Notes Clear free flow CSF on second attempt.  No paresthesia.  Patient tolerated procedure well.  Jasmine December, MD

## 2012-03-28 NOTE — Anesthesia Postprocedure Evaluation (Signed)
  Anesthesia Post-op Note  Patient: Sydney Griffin  Procedure(s) Performed: Procedure(s) (LRB): CESAREAN SECTION (N/A)  Patient Location: Mother/Baby  Anesthesia Type: Spinal  Level of Consciousness: awake  Airway and Oxygen Therapy: Patient Spontanous Breathing  Post-op Pain: mild  Post-op Assessment: Patient's Cardiovascular Status Stable and Respiratory Function Stable  Post-op Vital Signs: stable  Complications: No apparent anesthesia complications

## 2012-03-28 NOTE — Addendum Note (Signed)
Addendum  created 03/28/12 1629 by Renford Dills, CRNA   Modules edited:Notes Section

## 2012-03-28 NOTE — Interval H&P Note (Signed)
History and Physical Interval Note:  03/28/2012 7:29 AM  Sydney Griffin  has presented today for surgery, with the diagnosis of History previous C section- declines VBAC  The various methods of treatment have been discussed with the patient and family. After consideration of risks, benefits and other options for treatment, the patient has consented to  Procedure(s) (LRB): CESAREAN SECTION (N/A) as a surgical intervention .  The patient's history has been reviewed, patient examined, no change in status, stable for surgery.  I have reviewed the patient's chart and labs.  Questions were answered to the patient's satisfaction.     JACKSON-MOORE,Osa Fogarty A

## 2012-03-28 NOTE — Op Note (Signed)
Cesarean Section Procedure Note   [name]   03/28/2012  Indications: See below.   Pre-operative Diagnosis: History of previous C section- declines VBAC; h/o chronic HTN; IUP @ 39 weeks; morbid obesity  Post-operative Diagnosis: Same   Surgeon: [name]  Assistants: Coral Ceo A  Anesthesia: spinal  Procedure Details:  The patient was seen in the Holding Room. The risks, benefits, complications, treatment options, and expected outcomes were discussed with the patient. The patient concurred with the proposed plan, giving informed consent. The patient was identified as Sydney Griffin and the procedure verified as C-Section Delivery. A Time Out was held and the above information confirmed.  After induction of anesthesia, the patient was draped and prepped in the usual sterile manner. A transverse incision was made and carried down through the subcutaneous tissue to the fascia. The fascial incision was made and extended transversely. The fascia was separated from the underlying rectus tissue superiorly and inferiorly. The peritoneum was identified and entered. The peritoneal incision was extended longitudinally. The utero-vesical peritoneal reflection was incised transversely and the bladder flap was bluntly freed from the lower uterine segment. A low transverse uterine incision was made. Delivered from cephalic presentation was a living newborn female infant at five minutes. A cord ph was sent. The umbilical cord was clamped and cut cord. A sample was obtained for evaluation. The placenta was removed Intact and appeared normal.  The uterine incision was closed with running locked sutures of 1-0 Monocryl. A second imbricating layer of the same suture was placed.  Hemostasis was observed. The paracolic gutters were irrigated. The fascia was then reapproximated with running sutures of 1-0Vicryl. The subcuticular closure was performed using 3-0 Monocryl.   A wound vac  dressing was applied. Instrument, sponge, and needle counts were correct prior the abdominal closure and were correct at the conclusion of the case.    Findings:  See above   Estimated Blood Loss: 600 ml  Total IV Fluids: per Anesthesiology  Urine Output: per Anesthesiology  Specimens: Placenta  Complications: no complications  Disposition: PACU - hemodynamically stable.  Maternal Condition: stable   Baby condition / location:  nursery-stable    Signed: Surgeon(s): Antionette Char, MD Brock Bad, MD

## 2012-03-28 NOTE — Anesthesia Postprocedure Evaluation (Signed)
Anesthesia Post Note  Patient: Sydney Griffin  Procedure(s) Performed: Procedure(s) (LRB): CESAREAN SECTION (N/A)  Anesthesia type: Spinal  Patient location: PACU  Post pain: Pain level controlled  Post assessment: Post-op Vital signs reviewed  Last Vitals:  Filed Vitals:   03/28/12 0954  BP:   Pulse: 67  Temp:   Resp: 18    Post vital signs: Reviewed  Level of consciousness: awake  Complications: No apparent anesthesia complications

## 2012-03-28 NOTE — Anesthesia Preprocedure Evaluation (Addendum)
Anesthesia Evaluation  Patient identified by MRN, date of birth, ID band Patient awake    Reviewed: Allergy & Precautions, H&P , NPO status , Patient's Chart, lab work & pertinent test results, reviewed documented beta blocker date and time   Airway Mallampati: III TM Distance: >3 FB Neck ROM: Full    Dental No notable dental hx. (+) Teeth Intact   Pulmonary asthma , Current Smoker,  breath sounds clear to auscultation  Pulmonary exam normal       Cardiovascular hypertension, Pt. on medications and Pt. on home beta blockers Rhythm:Regular Rate:Normal     Neuro/Psych  Headaches, negative psych ROS   GI/Hepatic Neg liver ROS, GERD-  Medicated and Controlled,  Endo/Other  Morbid obesity  Renal/GU negative Renal ROS  negative genitourinary   Musculoskeletal negative musculoskeletal ROS (+)   Abdominal (+) + obese,   Peds  Hematology negative hematology ROS (+)   Anesthesia Other Findings   Reproductive/Obstetrics (+) Pregnancy Previous C/Section                         Anesthesia Physical Anesthesia Plan  ASA: III  Anesthesia Plan: Spinal   Post-op Pain Management:    Induction: Intravenous  Airway Management Planned: Natural Airway  Additional Equipment:   Intra-op Plan:   Post-operative Plan:   Informed Consent: I have reviewed the patients History and Physical, chart, labs and discussed the procedure including the risks, benefits and alternatives for the proposed anesthesia with the patient or authorized representative who has indicated his/her understanding and acceptance.   Dental advisory given  Plan Discussed with: CRNA, Anesthesiologist and Surgeon  Anesthesia Plan Comments:         Anesthesia Quick Evaluation

## 2012-03-28 NOTE — Transfer of Care (Signed)
Immediate Anesthesia Transfer of Care Note  Patient: Sydney Griffin  Procedure(s) Performed: Procedure(s) (LRB): CESAREAN SECTION (N/A)  Patient Location: PACU  Anesthesia Type: Spinal  Level of Consciousness: awake, alert  and oriented  Airway & Oxygen Therapy: Patient Spontanous Breathing  Post-op Assessment: Report given to PACU RN and Post -op Vital signs reviewed and stable  Post vital signs: Reviewed and stable  Complications: No apparent anesthesia complications

## 2012-03-29 ENCOUNTER — Encounter (HOSPITAL_COMMUNITY): Payer: Self-pay | Admitting: Obstetrics & Gynecology

## 2012-03-29 LAB — COMPREHENSIVE METABOLIC PANEL
Alkaline Phosphatase: 97 U/L (ref 39–117)
BUN: 9 mg/dL (ref 6–23)
CO2: 27 mEq/L (ref 19–32)
GFR calc Af Amer: >90 mL/min (ref >90)
GFR calc non Af Amer: >90 mL/min (ref >90)
Glucose, Bld: 94 mg/dL (ref 70–99)
Potassium: 4 mEq/L (ref 3.5–5.1)
Total Bilirubin: 0.5 mg/dL (ref 0.3–1.2)
Total Protein: 5.2 g/dL — ABNORMAL LOW (ref 6.0–8.3)

## 2012-03-29 LAB — LACTATE DEHYDROGENASE: LDH: 189 U/L (ref 94–250)

## 2012-03-29 NOTE — Progress Notes (Signed)
Subjective: Postpartum Day 1: Cesarean Delivery Patient reports incisional pain, tolerating PO, + flatus and no problems voiding.    Objective: Vital signs in last 24 hours: Temp:  [97.6 F (36.4 C)-98.8 F (37.1 C)] 98.3 F (36.8 C) (07/23 0805) Pulse Rate:  [64-80] 68  (07/23 0805) Resp:  [16-23] 20  (07/23 0805) BP: (126-157)/(67-95) 134/76 mmHg (07/23 0805) SpO2:  [95 %-100 %] 96 % (07/23 0805) Weight:  [167.831 kg (370 lb)] 167.831 kg (370 lb) (07/22 0915)  Physical Exam:  General: alert and no distress Lochia: appropriate Uterine Fundus: firm Incision: healing well DVT Evaluation: No evidence of DVT seen on physical exam.   Basename 03/29/12 0555  HGB 10.7*  HCT --    Assessment/Plan: Status post Cesarean section. Doing well postoperatively.  Continue current care.  Alonza Knisley A 03/29/2012, 8:59 AM

## 2012-03-29 NOTE — Progress Notes (Signed)
UR Chart review completed.  

## 2012-03-30 MED ORDER — SIMETHICONE 80 MG PO CHEW
80.0000 mg | CHEWABLE_TABLET | Freq: Four times a day (QID) | ORAL | Status: DC
  Administered 2012-03-30 – 2012-03-31 (×4): 80 mg via ORAL

## 2012-03-30 MED ORDER — PANTOPRAZOLE SODIUM 40 MG PO TBEC
40.0000 mg | DELAYED_RELEASE_TABLET | Freq: Every day | ORAL | Status: DC
  Administered 2012-03-30: 40 mg via ORAL
  Filled 2012-03-30 (×2): qty 1

## 2012-03-30 MED ORDER — LABETALOL HCL 5 MG/ML IV SOLN
20.0000 mg | Freq: Once | INTRAVENOUS | Status: AC
  Administered 2012-03-30: 20 mg via INTRAVENOUS
  Filled 2012-03-30: qty 4

## 2012-03-30 MED ORDER — MAGNESIUM HYDROXIDE 400 MG/5ML PO SUSP
30.0000 mL | Freq: Every evening | ORAL | Status: DC | PRN
  Administered 2012-03-31: 30 mL via ORAL

## 2012-03-30 MED ORDER — LABETALOL HCL 5 MG/ML IV SOLN
40.0000 mg | Freq: Once | INTRAVENOUS | Status: AC | PRN
  Filled 2012-03-30: qty 8

## 2012-03-30 MED ORDER — BISACODYL 10 MG RE SUPP
10.0000 mg | Freq: Every day | RECTAL | Status: DC | PRN
  Administered 2012-03-30: 10 mg via RECTAL
  Filled 2012-03-30: qty 1

## 2012-03-30 NOTE — Progress Notes (Signed)
Subjective: Postpartum Day 2: Cesarean Delivery Patient reports "gas" pains.  Objective: Vital signs in last 24 hours: Temp:  [98.1 F (36.7 C)-98.3 F (36.8 C)] 98.2 F (36.8 C) (07/24 0605) Pulse Rate:  [68-76] 76  (07/24 0745) Resp:  [18-20] 20  (07/24 0745) BP: (128-170)/(74-103) 155/99 mmHg (07/24 0745)  Physical Exam:  General: alert and no distress Lochia: appropriate Uterine Fundus: firm Incision: healing well DVT Evaluation: No evidence of DVT seen on physical exam.   Basename 03/29/12 0555  HGB 10.7*  HCT --    Assessment/Plan: Status post Cesarean section. Doing well postoperatively.  Continue current care.  Ambulate.  MOM today and hs prn.  Shanan Mcmiller A 03/30/2012, 8:05 AM

## 2012-03-30 NOTE — Plan of Care (Signed)
Problem: Consults Goal: Skin Care Protocol Initiated - if indicated If consults are not indicated, leave blank or document N/A  Outcome: Progressing Pt has wound vac to constant drainage. MD to D/C 7/25 per pt.  Problem: Discharge Progression Outcomes Goal: Barriers To Progression Addressed/Resolved Outcome: Progressing Pt having elevated pressures, MD aware.

## 2012-03-30 NOTE — Progress Notes (Signed)
Patient BP 170/103; pt c/o gas pain 10/10. Given 2 percocet and rechecked bp in 35 mins with result of 170/102. Pt denies headache, blurred vision or epigastic pain, says she feels like she needs to pass gas or have a bm, and pain in lower abdomen and back. Bowel sounds hypoactive. Notified MD of vital signs and given orders for labetalol IVP.

## 2012-03-31 MED ORDER — OXYCODONE-ACETAMINOPHEN 5-325 MG PO TABS: 1.0000 | ORAL_TABLET | ORAL | Status: AC | PRN

## 2012-03-31 NOTE — Progress Notes (Signed)
Subjective: Postpartum Day 3: Cesarean Delivery Patient reports incisional pain, tolerating PO, + flatus, + BM and no problems voiding.    Objective: Vital signs in last 24 hours: Temp:  [98 F (36.7 C)-98.2 F (36.8 C)] 98.1 F (36.7 C) (07/25 0546) Pulse Rate:  [76-97] 80  (07/25 0546) Resp:  [20] 20  (07/25 0546) BP: (134-171)/(83-98) 143/86 mmHg (07/25 0546)  Physical Exam:  General: alert and no distress Lochia: appropriate Uterine Fundus: firm Incision: healing well DVT Evaluation: No evidence of DVT seen on physical exam.   Basename 03/29/12 0555  HGB 10.7*  HCT --    Assessment/Plan: Status post Cesarean section. Doing well postoperatively.  Discharge home with standard precautions and return to clinic in 2 weeks.  HARPER,CHARLES A 03/31/2012, 8:43 AM

## 2012-03-31 NOTE — Discharge Summary (Signed)
Obstetric Discharge Summary Reason for Admission: cesarean section Prenatal Procedures: NST and ultrasound Intrapartum Procedures: cesarean: low cervical, transverse Postpartum Procedures: none Complications-Operative and Postpartum: none Hemoglobin  Date Value Range Status  03/29/2012 10.7* 12.0 - 15.0 g/dL Final     HCT  Date Value Range Status  03/25/2012 36.1  36.0 - 46.0 % Final    Physical Exam:  General: alert and no distress Lochia: appropriate Uterine Fundus: firm Incision: healing well DVT Evaluation: No evidence of DVT seen on physical exam.  Discharge Diagnoses: Term Pregnancy-delivered  Discharge Information: Date: 03/31/2012 Activity: pelvic rest Diet: routine Medications: PNV, Colace and Percocet Condition: stable Instructions: refer to practice specific booklet Discharge to: home Follow-up Information    Follow up with [name], MD. Schedule an appointment as soon as possible for a visit in 2 weeks.   Contact information:   [address] [place] [address] [phone]          Newborn Data: Live born female  Birth Weight: 5 lb 10 oz (2550 g) APGAR: 9, 9  Home with mother.  HARPER,CHARLES A 03/31/2012, 8:51 AM

## 2012-04-01 LAB — TYPE AND SCREEN
ABO/RH(D): O POS
Unit division: 0

## 2012-04-05 ENCOUNTER — Encounter (HOSPITAL_COMMUNITY): Payer: Self-pay | Admitting: *Deleted

## 2012-04-05 ENCOUNTER — Inpatient Hospital Stay (HOSPITAL_COMMUNITY)
Admission: AD | Admit: 2012-04-05 | Discharge: 2012-04-05 | Disposition: A | Discharge status: Home / Self Care | Payer: Medicaid Other | Source: Ambulatory Visit | Attending: Obstetrics | Admitting: Obstetrics

## 2012-04-05 DIAGNOSIS — R51 Headache: Secondary | ICD-10-CM | POA: Insufficient documentation

## 2012-04-05 DIAGNOSIS — O1093 Unspecified pre-existing hypertension complicating the puerperium: Secondary | ICD-10-CM

## 2012-04-05 DIAGNOSIS — R030 Elevated blood-pressure reading, without diagnosis of hypertension: Secondary | ICD-10-CM | POA: Insufficient documentation

## 2012-04-05 DIAGNOSIS — O99893 Other specified diseases and conditions complicating puerperium: Secondary | ICD-10-CM | POA: Insufficient documentation

## 2012-04-05 LAB — URINE MICROSCOPIC-ADD ON

## 2012-04-05 LAB — URINALYSIS, ROUTINE W REFLEX MICROSCOPIC
Leukocytes, UA: NEGATIVE
Nitrite: NEGATIVE
Specific Gravity, Urine: >1.03 — ABNORMAL HIGH (ref 1.005–1.030)
pH: 6 (ref 5.0–8.0)

## 2012-04-05 LAB — COMPREHENSIVE METABOLIC PANEL
AST: 24 U/L (ref 0–37)
Albumin: 2.8 g/dL — ABNORMAL LOW (ref 3.5–5.2)
Calcium: 8.8 mg/dL (ref 8.4–10.5)
Chloride: 105 mEq/L (ref 96–112)
Creatinine, Ser: 0.86 mg/dL (ref 0.50–1.10)
Total Bilirubin: 0.4 mg/dL (ref 0.3–1.2)

## 2012-04-05 LAB — CBC
MCV: 92.2 fL (ref 78.0–100.0)
Platelets: 292 10*3/uL (ref 150–400)
RDW: 13 % (ref 11.5–15.5)
WBC: 8 10*3/uL (ref 4.0–10.5)

## 2012-04-05 LAB — URIC ACID: Uric Acid, Serum: 6 mg/dL (ref 2.4–7.0)

## 2012-04-05 MED ORDER — HYDROCHLOROTHIAZIDE 25 MG PO TABS: 25.0000 mg | ORAL_TABLET | Freq: Every day | ORAL | Status: DC

## 2012-04-05 MED ORDER — BUTALBITAL-APAP-CAFFEINE 50-325-40 MG PO TABS: 2.0000 | ORAL_TABLET | Freq: Once | ORAL | Status: DC

## 2012-04-05 NOTE — MAU Note (Signed)
PT SAYS SHE DELIVERED BY C/S ON 7-22-   WAS D/C FROM HOSPITAL  7-25-  IS BOTTLEFEEDING.    STARTED HAVING H/A ON Monday-  SHE TOOK TYLENOL AND OXYCODONE- SHE FELL ASLEEP- BUT STILL HAD H/A.    HOME NURSE CAME BY HOUSE TODAY- AND TOOK BP- 166/100-  NO H/A BEFORE THAT.  HAS H/A NOW.     WAS ON  2 MEDS FOR BP BEFORE PREG AND CONTINUED 1 MED WHILE PREG.

## 2012-04-05 NOTE — MAU Provider Note (Signed)
Sydney Belton Hinson30 [id number] @[redacted]w[redacted]d  by LMP Chief Complaint  Patient presents with  . Headache     None     SUBJECTIVE  HPI: Pt is 8 day S/P C/S who present to MAU reporting elevated BP 166/100 per home health nurse.  Past Medical History  Diagnosis Date  . Acid reflux   . Menstrual bleeding problem   . Headache   . Ectopic pregnancy     Had MTX  . Asthma May 2013    info from [facility] states asthma but pt denies and said she had bronchitis, used an Hydrologist while sick and has now lost the inhaler.  . Hypertension    Past Surgical History  Procedure Date  . Cesarean section   . Dilation and curettage of uterus   . Laparoscopy   . Cesarean section 03/28/2012    Procedure: CESAREAN SECTION;  Surgeon: Antionette Char, MD;  Location: [facility];  Service: Gynecology;  Laterality: N/A;   History   Social History  . Marital Status: Single    Spouse Name: N/A    Number of Children: N/A  . Years of Education: N/A   Occupational History  . Not on file.   Social History Main Topics  . Smoking status: Current Everyday Smoker -- 0.5 packs/day    Types: Cigarettes  . Smokeless tobacco: Never Used  . Alcohol Use: No  . Drug Use: No  . Sexually Active: Yes    Birth Control/ Protection: None   Other Topics Concern  . Not on file   Social History Narrative  . No narrative on file   No current facility-administered medications on file prior to encounter.   Current Outpatient Prescriptions on File Prior to Encounter  Medication Sig Dispense Refill  . esomeprazole (NEXIUM) 40 MG capsule Take 40 mg by mouth daily before breakfast.       . nebivolol (BYSTOLIC) 10 MG tablet Take 10 mg by mouth daily.      Marland Kitchen oxyCODONE-acetaminophen (PERCOCET/ROXICET) 5-325 MG per tablet Take 1-2 tablets by mouth every 4 (four) hours as needed (moderate - severe pain).  40 tablet  0  . Prenatal Vit-Fe Fumarate-FA (PRENATAL MULTIVITAMIN) TABS Take 1 tablet by mouth daily.       Allergies  Allergen  Reactions  . Aspirin Shortness Of Breath  . Dilaudid (Hydromorphone Hcl) Other (See Comments)    Pt says medication causes her to shake uncontrollably.  . Ibuprofen     Gi upset    ROS: Pertinent items in HPI  OBJECTIVE Blood pressure 157/81, pulse 68, temperature 98.7 F (37.1 C), temperature source Oral, resp. rate 18, height 5\' 8"  (1.727 m), weight 162.615 kg (358 lb 8 oz), last menstrual period 06/22/2011, not currently breastfeeding.  GENERAL: Well-developed, well-nourished female in no acute distress.  HEENT: Normocephalic, good dentition HEART: normal rate RESP: normal effort ABDOMEN: Soft, non-tender, low transverse incision healing well EXTREMITIES: Nontender, 2+ edema NEURO: Alert and oriented, DTRs 3+, no clonus SPECULUM EXAM: Deferred  LAB RESULTS  Results for orders placed during the hospital encounter of 04/05/12 (from the past 24 hour(s))  URINALYSIS, ROUTINE W REFLEX MICROSCOPIC     Status: Abnormal   Collection Time   04/05/12  7:50 PM      Component Value Range   Color, Urine YELLOW  YELLOW   APPearance CLEAR  CLEAR   Specific Gravity, Urine >1.030 (*) 1.005 - 1.030   pH 6.0  5.0 - 8.0   Glucose, UA NEGATIVE  NEGATIVE  mg/dL   Hgb urine dipstick MODERATE (*) NEGATIVE   Bilirubin Urine NEGATIVE  NEGATIVE   Ketones, ur NEGATIVE  NEGATIVE mg/dL   Protein, ur NEGATIVE  NEGATIVE mg/dL   Urobilinogen, UA 1.0  0.0 - 1.0 mg/dL   Nitrite NEGATIVE  NEGATIVE   Leukocytes, UA NEGATIVE  NEGATIVE  URINE MICROSCOPIC-ADD ON     Status: Abnormal   Collection Time   04/05/12  7:50 PM      Component Value Range   Squamous Epithelial / LPF FEW (*) RARE   WBC, UA 3-6  <3 WBC/hpf   RBC / HPF 3-6  <3 RBC/hpf   Bacteria, UA FEW (*) RARE  CBC     Status: Abnormal   Collection Time   04/05/12  8:35 PM      Component Value Range   WBC 8.0  4.0 - 10.5 K/uL   RBC 3.44 (*) 3.87 - 5.11 MIL/uL   Hemoglobin 10.7 (*) 12.0 - 15.0 g/dL   HCT 25.3 (*) 66.4 - 40.3 %   MCV 92.2  78.0  - 100.0 fL   MCH 31.1  26.0 - 34.0 pg   MCHC 33.8  30.0 - 36.0 g/dL   RDW 47.4  25.9 - 56.3 %   Platelets 292  150 - 400 K/uL  COMPREHENSIVE METABOLIC PANEL     Status: Abnormal   Collection Time   04/05/12  8:35 PM      Component Value Range   Sodium 140  135 - 145 mEq/L   Potassium 3.7  3.5 - 5.1 mEq/L   Chloride 105  96 - 112 mEq/L   CO2 25  19 - 32 mEq/L   Glucose, Bld 78  70 - 99 mg/dL   BUN 11  6 - 23 mg/dL   Creatinine, Ser 8.75  0.50 - 1.10 mg/dL   Calcium 8.8  8.4 - 64.3 mg/dL   Total Protein 6.2  6.0 - 8.3 g/dL   Albumin 2.8 (*) 3.5 - 5.2 g/dL   AST 24  0 - 37 U/L   ALT 21  0 - 35 U/L   Alkaline Phosphatase 86  39 - 117 U/L   Total Bilirubin 0.4  0.3 - 1.2 mg/dL   GFR calc non Af Amer >90  >90 mL/min   GFR calc Af Amer >90  >90 mL/min  LACTATE DEHYDROGENASE     Status: Normal   Collection Time   04/05/12  8:35 PM      Component Value Range   LDH 212  94 - 250 U/L  URIC ACID     Status: Normal   Collection Time   04/05/12  8:35 PM      Component Value Range   Uric Acid, Serum 6.0  2.4 - 7.0 mg/dL    IMAGING N/A  ASSESSMENT 1. Hypertension in pregnancy, pre-existing, postpartum complication    PLAN Discharge home per consult with Dr. Clearance Coots May start HCTZ in the morning PIH precautions Medication List  As of 04/09/2012  6:03 AM   TAKE these medications         acetaminophen 500 MG tablet   Commonly known as: TYLENOL   Take 1,000 mg by mouth every 6 (six) hours as needed. For pain      esomeprazole 40 MG capsule   Commonly known as: NEXIUM   Take 40 mg by mouth daily before breakfast.      hydrochlorothiazide 25 MG tablet   Commonly known as: HYDRODIURIL  Take 1 tablet (25 mg total) by mouth daily.      nebivolol 10 MG tablet   Commonly known as: BYSTOLIC   Take 10 mg by mouth daily.      oxyCODONE-acetaminophen 5-325 MG per tablet   Commonly known as: PERCOCET/ROXICET   Take 1-2 tablets by mouth every 4 (four) hours as needed (moderate - severe  pain).      prenatal multivitamin Tabs   Take 1 tablet by mouth daily.           Follow-up Information    Follow up with [name], MD. Call in 1 day. (Follow up in office tomorrow. Return to MAU as needed)    Contact information:   [address] [place] [address] [phone]         Dorathy Kinsman 04/05/2012 9:43 PM

## 2012-04-05 NOTE — MAU Note (Signed)
Pt reports Elevated bp @ home

## 2012-04-08 ENCOUNTER — Encounter (HOSPITAL_COMMUNITY): Payer: Self-pay | Admitting: *Deleted

## 2012-04-08 ENCOUNTER — Inpatient Hospital Stay (HOSPITAL_COMMUNITY)
Admission: AD | Admit: 2012-04-08 | Discharge: 2012-04-11 | DRG: 776 | Disposition: A | Discharge status: Home / Self Care | Payer: Medicaid Other | Source: Ambulatory Visit | Attending: Obstetrics | Admitting: Obstetrics

## 2012-04-08 DIAGNOSIS — O10019 Pre-existing essential hypertension complicating pregnancy, unspecified trimester: Secondary | ICD-10-CM

## 2012-04-08 DIAGNOSIS — I10 Essential (primary) hypertension: Secondary | ICD-10-CM | POA: Diagnosis present

## 2012-04-08 DIAGNOSIS — O1093 Unspecified pre-existing hypertension complicating the puerperium: Secondary | ICD-10-CM

## 2012-04-08 DIAGNOSIS — Z98891 History of uterine scar from previous surgery: Secondary | ICD-10-CM

## 2012-04-08 DIAGNOSIS — IMO0002 Reserved for concepts with insufficient information to code with codable children: Principal | ICD-10-CM

## 2012-04-08 LAB — URINALYSIS, ROUTINE W REFLEX MICROSCOPIC
Glucose, UA: NEGATIVE mg/dL
Nitrite: NEGATIVE
Protein, ur: NEGATIVE mg/dL

## 2012-04-08 LAB — URINE MICROSCOPIC-ADD ON

## 2012-04-08 LAB — COMPREHENSIVE METABOLIC PANEL
ALT: 18 U/L (ref 0–35)
AST: 20 U/L (ref 0–37)
Calcium: 9 mg/dL (ref 8.4–10.5)
GFR calc Af Amer: 83 mL/min — ABNORMAL LOW (ref >90)
Sodium: 140 mEq/L (ref 135–145)
Total Protein: 6.5 g/dL (ref 6.0–8.3)

## 2012-04-08 LAB — LACTATE DEHYDROGENASE: LDH: 206 U/L (ref 94–250)

## 2012-04-08 LAB — CBC
MCH: 30.5 pg (ref 26.0–34.0)
MCHC: 33.4 g/dL (ref 30.0–36.0)
Platelets: 330 10*3/uL (ref 150–400)

## 2012-04-08 LAB — MRSA PCR SCREENING: MRSA by PCR: NEGATIVE

## 2012-04-08 MED ORDER — DIBUCAINE 1 % RE OINT: 1.0000 "application " | TOPICAL_OINTMENT | RECTAL | Status: DC | PRN

## 2012-04-08 MED ORDER — SIMETHICONE 80 MG PO CHEW: 80.0000 mg | CHEWABLE_TABLET | ORAL | Status: DC | PRN

## 2012-04-08 MED ORDER — SIMETHICONE 80 MG PO CHEW: 80.0000 mg | CHEWABLE_TABLET | Freq: Three times a day (TID) | ORAL | Status: DC

## 2012-04-08 MED ORDER — DEXTROSE-NACL 5-0.45 % IV SOLN: INTRAVENOUS | Status: DC

## 2012-04-08 MED ORDER — TETANUS-DIPHTH-ACELL PERTUSSIS 5-2.5-18.5 LF-MCG/0.5 IM SUSP
0.5000 mL | Freq: Once | INTRAMUSCULAR | Status: DC
  Filled 2012-04-08: qty 0.5

## 2012-04-08 MED ORDER — MAGNESIUM SULFATE 40 MG/ML IJ SOLN: 2.0000 g | Freq: Once | INTRAMUSCULAR | Status: DC

## 2012-04-08 MED ORDER — LACTATED RINGERS IV SOLN
INTRAVENOUS | Status: DC
  Administered 2012-04-08 – 2012-04-09 (×4): via INTRAVENOUS
  Administered 2012-04-09: 100 mL/h via INTRAVENOUS
  Administered 2012-04-10: 06:00:00 via INTRAVENOUS

## 2012-04-08 MED ORDER — DEXAMETHASONE SODIUM PHOSPHATE 10 MG/ML IJ SOLN
10.0000 mg | Freq: Once | INTRAMUSCULAR | Status: AC
  Administered 2012-04-08: 10 mg via INTRAVENOUS
  Filled 2012-04-08: qty 1

## 2012-04-08 MED ORDER — DIPHENHYDRAMINE HCL 50 MG/ML IJ SOLN
25.0000 mg | Freq: Once | INTRAMUSCULAR | Status: AC
  Administered 2012-04-08: 25 mg via INTRAVENOUS
  Filled 2012-04-08: qty 1

## 2012-04-08 MED ORDER — PROCHLORPERAZINE EDISYLATE 5 MG/ML IJ SOLN
10.0000 mg | Freq: Once | INTRAMUSCULAR | Status: AC
  Administered 2012-04-08: 10 mg via INTRAVENOUS
  Filled 2012-04-08: qty 2

## 2012-04-08 MED ORDER — LANOLIN HYDROUS EX OINT: 1.0000 "application " | TOPICAL_OINTMENT | CUTANEOUS | Status: DC | PRN

## 2012-04-08 MED ORDER — MAGNESIUM SULFATE 40 G IN LACTATED RINGERS - SIMPLE
2.0000 g/h | INTRAVENOUS | Status: DC
  Administered 2012-04-08 – 2012-04-09 (×2): 2 g/h via INTRAVENOUS
  Filled 2012-04-08 (×2): qty 500

## 2012-04-08 MED ORDER — MAGNESIUM SULFATE 40 G IN LACTATED RINGERS - SIMPLE: 2.0000 g/h | INTRAVENOUS | Status: DC

## 2012-04-08 MED ORDER — ONDANSETRON HCL 4 MG PO TABS: 4.0000 mg | ORAL_TABLET | ORAL | Status: DC | PRN

## 2012-04-08 MED ORDER — ONDANSETRON HCL 4 MG/2ML IJ SOLN: 4.0000 mg | INTRAMUSCULAR | Status: DC | PRN

## 2012-04-08 MED ORDER — WITCH HAZEL-GLYCERIN EX PADS: 1.0000 "application " | MEDICATED_PAD | CUTANEOUS | Status: DC | PRN

## 2012-04-08 MED ORDER — MORPHINE SULFATE 10 MG/ML IJ SOLN
10.0000 mg | Freq: Once | INTRAMUSCULAR | Status: AC
  Administered 2012-04-08: 10 mg via INTRAMUSCULAR
  Filled 2012-04-08: qty 1

## 2012-04-08 MED ORDER — MAGNESIUM SULFATE BOLUS VIA INFUSION
4.0000 g | Freq: Once | INTRAVENOUS | Status: AC
  Administered 2012-04-08: 4 g via INTRAVENOUS
  Filled 2012-04-08: qty 500

## 2012-04-08 MED ORDER — DIPHENHYDRAMINE HCL 25 MG PO CAPS: 25.0000 mg | ORAL_CAPSULE | Freq: Four times a day (QID) | ORAL | Status: DC | PRN

## 2012-04-08 MED ORDER — ZOLPIDEM TARTRATE 5 MG PO TABS: 5.0000 mg | ORAL_TABLET | Freq: Every evening | ORAL | Status: DC | PRN

## 2012-04-08 MED ORDER — SENNOSIDES-DOCUSATE SODIUM 8.6-50 MG PO TABS
2.0000 | ORAL_TABLET | Freq: Every day | ORAL | Status: DC
  Administered 2012-04-08 – 2012-04-10 (×2): 2 via ORAL

## 2012-04-08 MED ORDER — MENTHOL 3 MG MT LOZG: 1.0000 | LOZENGE | OROMUCOSAL | Status: DC | PRN

## 2012-04-08 MED ORDER — PRENATAL MULTIVITAMIN CH: 1.0000 | ORAL_TABLET | Freq: Every day | ORAL | Status: DC

## 2012-04-08 MED ORDER — MORPHINE SULFATE 10 MG/ML IJ SOLN
5.0000 mg | Freq: Once | INTRAMUSCULAR | Status: AC
  Administered 2012-04-08: 5 mg via INTRAVENOUS
  Filled 2012-04-08: qty 1

## 2012-04-08 MED ORDER — BUTALBITAL-APAP-CAFFEINE 50-325-40 MG PO TABS
2.0000 | ORAL_TABLET | Freq: Four times a day (QID) | ORAL | Status: DC | PRN
  Administered 2012-04-08 – 2012-04-09 (×3): 2 via ORAL
  Filled 2012-04-08 (×3): qty 2

## 2012-04-08 MED ORDER — BUTALBITAL-APAP-CAFFEINE 50-325-40 MG PO TABS
2.0000 | ORAL_TABLET | Freq: Once | ORAL | Status: AC
  Administered 2012-04-08: 2 via ORAL
  Filled 2012-04-08: qty 2

## 2012-04-08 NOTE — MAU Note (Signed)
Headache increased last evening, scheduled C/S on 7/22, light sensitive, chronic HTN x 2 yrs. Last visit with PP h/a, med dose increased. Pt states H/A both lying and sitting, took HTN meds today at 0500

## 2012-04-08 NOTE — MAU Note (Signed)
Pt up to BR, states headache a little better, waiting for UA results and will report to Dr. Clearance Coots.  Report given to next nurse to report

## 2012-04-08 NOTE — Progress Notes (Signed)
Dr Clearance Coots notified and updated on patients status, orders received

## 2012-04-08 NOTE — MAU Note (Signed)
Patient states she had a cesarean section on 7-22. Has a history of HTN and in on medication which she states she has taken today. States she started having a headache last night that Tylenol does not help. Took her blood pressure today at home and was 198/110.

## 2012-04-08 NOTE — H&P (Signed)
CSN: [id number]  Arrival date and time: 04/08/12 4098   First Provider Initiated Contact with Patient 04/08/12 1028      Chief Complaint  Patient presents with  . Headache  . Hypertension   HPI Sydney Griffin 31 y.o. pospartum  - had C/S on 03-28-12 and had high blood pressure.  Today has severe headache and took blood pressure at home  - diastolic was 110.  Took her medication for high blood pressure at home today.  Currently has severe headache and is tearful.  Wearing sunglasses.  Has history of migraines.  OB History    Grav Para Term Preterm Abortions TAB SAB Ect Mult Living   3 2 2  0 1   1  2       Past Medical History  Diagnosis Date  . Acid reflux   . Menstrual bleeding problem   . Headache   . Ectopic pregnancy     Had MTX  . Asthma May 2013    info from [facility] states asthma but pt denies and said she had bronchitis, used an Hydrologist while sick and has now lost the inhaler.  . Hypertension     Past Surgical History  Procedure Date  . Cesarean section   . Dilation and curettage of uterus   . Laparoscopy   . Cesarean section 03/28/2012    Procedure: CESAREAN SECTION;  Surgeon: Antionette Char, MD;  Location: [facility];  Service: Gynecology;  Laterality: N/A;    Family History  Problem Relation Age of Onset  . Anesthesia problems Neg Hx   . Other Neg Hx   . Hypertension Mother   . Diabetes Mother   . Diabetes Father   . Hypertension Brother     History  Substance Use Topics  . Smoking status: Current Everyday Smoker -- 0.5 packs/day    Types: Cigarettes  . Smokeless tobacco: Never Used  . Alcohol Use: No    Allergies:  Allergies  Allergen Reactions  . Aspirin Shortness Of Breath  . Dilaudid (Hydromorphone Hcl) Other (See Comments)    Pt says medication causes her to shake uncontrollably.  . Ibuprofen     Gi upset    Prescriptions prior to admission  Medication Sig Dispense Refill  . acetaminophen (TYLENOL) 500 MG tablet Take 1,000 mg by  mouth every 6 (six) hours as needed. For pain      . esomeprazole (NEXIUM) 40 MG capsule Take 40 mg by mouth daily before breakfast.       . hydrochlorothiazide (HYDRODIURIL) 25 MG tablet Take 1 tablet (25 mg total) by mouth daily.  30 tablet  3  . nebivolol (BYSTOLIC) 10 MG tablet Take 10 mg by mouth daily.      Marland Kitchen oxyCODONE-acetaminophen (PERCOCET/ROXICET) 5-325 MG per tablet Take 1-2 tablets by mouth every 4 (four) hours as needed (moderate - severe pain).  40 tablet  0  . Prenatal Vit-Fe Fumarate-FA (PRENATAL MULTIVITAMIN) TABS Take 1 tablet by mouth daily.        ROS Physical Exam   Blood pressure 184/92, pulse 59, temperature 98.3 F (36.8 C), temperature source Oral, resp. rate 20, last menstrual period 06/22/2011, unknown if currently breastfeeding.  Physical Exam  Nursing note and vitals reviewed. Constitutional: She is oriented to person, place, and time. She appears well-developed.       Morbid obesity  HENT:  Head: Normocephalic.  Eyes:       Eyes closed with severe headache  Neck: Neck supple.  Musculoskeletal: Normal  range of motion.  Neurological: She is alert and oriented to person, place, and time.  Skin: Skin is warm and dry.  Psychiatric: She has a normal mood and affect.   Results for orders placed during the hospital encounter of 04/08/12 (from the past 24 hour(s))  URINALYSIS, ROUTINE W REFLEX MICROSCOPIC     Status: Abnormal   Collection Time   04/08/12 10:10 AM      Component Value Range   Color, Urine YELLOW  YELLOW   APPearance CLEAR  CLEAR   Specific Gravity, Urine <1.005 (*) 1.005 - 1.030   pH 6.5  5.0 - 8.0   Glucose, UA NEGATIVE  NEGATIVE mg/dL   Hgb urine dipstick LARGE (*) NEGATIVE   Bilirubin Urine NEGATIVE  NEGATIVE   Ketones, ur NEGATIVE  NEGATIVE mg/dL   Protein, ur NEGATIVE  NEGATIVE mg/dL   Urobilinogen, UA 0.2  0.0 - 1.0 mg/dL   Nitrite NEGATIVE  NEGATIVE   Leukocytes, UA SMALL (*) NEGATIVE  URINE MICROSCOPIC-ADD ON     Status: Abnormal    Collection Time   04/08/12 10:10 AM      Component Value Range   Squamous Epithelial / LPF FEW (*) RARE   WBC, UA 3-6  <3 WBC/hpf   RBC / HPF 11-20  <3 RBC/hpf   Bacteria, UA FEW (*) RARE  CBC     Status: Abnormal   Collection Time   04/08/12 10:27 AM      Component Value Range   WBC 8.6  4.0 - 10.5 K/uL   RBC 3.70 (*) 3.87 - 5.11 MIL/uL   Hemoglobin 11.3 (*) 12.0 - 15.0 g/dL   HCT 16.1 (*) 09.6 - 04.5 %   MCV 91.4  78.0 - 100.0 fL   MCH 30.5  26.0 - 34.0 pg   MCHC 33.4  30.0 - 36.0 g/dL   RDW 40.9  81.1 - 91.4 %   Platelets 330  150 - 400 K/uL  COMPREHENSIVE METABOLIC PANEL     Status: Abnormal   Collection Time   04/08/12 10:27 AM      Component Value Range   Sodium 140  135 - 145 mEq/L   Potassium 3.8  3.5 - 5.1 mEq/L   Chloride 102  96 - 112 mEq/L   CO2 29  19 - 32 mEq/L   Glucose, Bld 103 (*) 70 - 99 mg/dL   BUN 11  6 - 23 mg/dL   Creatinine, Ser 7.82  0.50 - 1.10 mg/dL   Calcium 9.0  8.4 - 95.6 mg/dL   Total Protein 6.5  6.0 - 8.3 g/dL   Albumin 3.2 (*) 3.5 - 5.2 g/dL   AST 20  0 - 37 U/L   ALT 18  0 - 35 U/L   Alkaline Phosphatase 92  39 - 117 U/L   Total Bilirubin 0.7  0.3 - 1.2 mg/dL   GFR calc non Af Amer 72 (*) >90 mL/min   GFR calc Af Amer 83 (*) >90 mL/min  URIC ACID     Status: Abnormal   Collection Time   04/08/12 10:27 AM      Component Value Range   Uric Acid, Serum 7.5 (*) 2.4 - 7.0 mg/dL  LACTATE DEHYDROGENASE     Status: Normal   Collection Time   04/08/12 10:27 AM      Component Value Range   LDH 206  94 - 250 U/L   Filed Vitals:   04/08/12 1007 04/08/12 1021 04/08/12 1044  BP: 169/106 165/100 184/92  Pulse: 66 64 59  Temp: 98.3 F (36.8 C)    TempSrc: Oral    Resp: 20 20      MAU Course  Procedures  MDM 1045  Consult with Dr. Clearance Coots re: plan of care  1420  Have given multiple meds for headache with minimal relief. BP now elevated to 200/99.  Discussed with Dr Clearance Coots. Will admit for Mag Sulfate.   Assessment and Plan  A:   Postpartum      Severe range Hypertension, possible PP preeclampsia P:  Admit      Magnesium Sulfate infusion

## 2012-04-08 NOTE — MAU Provider Note (Signed)
History     CSN: [id number]  Arrival date and time: 04/08/12 4098   First Provider Initiated Contact with Patient 04/08/12 1028      Chief Complaint  Patient presents with  . Headache  . Hypertension   HPI Sydney Griffin 31 y.o. pospartum  - had C/S on 03-28-12 and had high blood pressure.  Today has severe headache and took blood pressure at home  - diastolic was 110.  Took her medication for high blood pressure at home today.  Currently has severe headache and is tearful.  Wearing sunglasses.  Has history of migraines.  OB History    Grav Para Term Preterm Abortions TAB SAB Ect Mult Living   3 2 2  0 1   1  2       Past Medical History  Diagnosis Date  . Acid reflux   . Menstrual bleeding problem   . Headache   . Ectopic pregnancy     Had MTX  . Asthma May 2013    info from [facility] states asthma but pt denies and said she had bronchitis, used an Hydrologist while sick and has now lost the inhaler.  . Hypertension     Past Surgical History  Procedure Date  . Cesarean section   . Dilation and curettage of uterus   . Laparoscopy   . Cesarean section 03/28/2012    Procedure: CESAREAN SECTION;  Surgeon: Antionette Char, MD;  Location: [facility];  Service: Gynecology;  Laterality: N/A;    Family History  Problem Relation Age of Onset  . Anesthesia problems Neg Hx   . Other Neg Hx   . Hypertension Mother   . Diabetes Mother   . Diabetes Father   . Hypertension Brother     History  Substance Use Topics  . Smoking status: Current Everyday Smoker -- 0.5 packs/day    Types: Cigarettes  . Smokeless tobacco: Never Used  . Alcohol Use: No    Allergies:  Allergies  Allergen Reactions  . Aspirin Shortness Of Breath  . Dilaudid (Hydromorphone Hcl) Other (See Comments)    Pt says medication causes her to shake uncontrollably.  . Ibuprofen     Gi upset    Prescriptions prior to admission  Medication Sig Dispense Refill  . acetaminophen (TYLENOL) 500 MG tablet Take  1,000 mg by mouth every 6 (six) hours as needed. For pain      . esomeprazole (NEXIUM) 40 MG capsule Take 40 mg by mouth daily before breakfast.       . hydrochlorothiazide (HYDRODIURIL) 25 MG tablet Take 1 tablet (25 mg total) by mouth daily.  30 tablet  3  . nebivolol (BYSTOLIC) 10 MG tablet Take 10 mg by mouth daily.      Marland Kitchen oxyCODONE-acetaminophen (PERCOCET/ROXICET) 5-325 MG per tablet Take 1-2 tablets by mouth every 4 (four) hours as needed (moderate - severe pain).  40 tablet  0  . Prenatal Vit-Fe Fumarate-FA (PRENATAL MULTIVITAMIN) TABS Take 1 tablet by mouth daily.        ROS Physical Exam   Blood pressure 184/92, pulse 59, temperature 98.3 F (36.8 C), temperature source Oral, resp. rate 20, last menstrual period 06/22/2011, unknown if currently breastfeeding.  Physical Exam  Nursing note and vitals reviewed. Constitutional: She is oriented to person, place, and time. She appears well-developed.       Morbid obesity  HENT:  Head: Normocephalic.  Eyes:       Eyes closed with severe headache  Neck: Neck  supple.  Musculoskeletal: Normal range of motion.  Neurological: She is alert and oriented to person, place, and time.  Skin: Skin is warm and dry.  Psychiatric: She has a normal mood and affect.   Results for orders placed during the hospital encounter of 04/08/12 (from the past 24 hour(s))  URINALYSIS, ROUTINE W REFLEX MICROSCOPIC     Status: Abnormal   Collection Time   04/08/12 10:10 AM      Component Value Range   Color, Urine YELLOW  YELLOW   APPearance CLEAR  CLEAR   Specific Gravity, Urine <1.005 (*) 1.005 - 1.030   pH 6.5  5.0 - 8.0   Glucose, UA NEGATIVE  NEGATIVE mg/dL   Hgb urine dipstick LARGE (*) NEGATIVE   Bilirubin Urine NEGATIVE  NEGATIVE   Ketones, ur NEGATIVE  NEGATIVE mg/dL   Protein, ur NEGATIVE  NEGATIVE mg/dL   Urobilinogen, UA 0.2  0.0 - 1.0 mg/dL   Nitrite NEGATIVE  NEGATIVE   Leukocytes, UA SMALL (*) NEGATIVE  URINE MICROSCOPIC-ADD ON      Status: Abnormal   Collection Time   04/08/12 10:10 AM      Component Value Range   Squamous Epithelial / LPF FEW (*) RARE   WBC, UA 3-6  <3 WBC/hpf   RBC / HPF 11-20  <3 RBC/hpf   Bacteria, UA FEW (*) RARE  CBC     Status: Abnormal   Collection Time   04/08/12 10:27 AM      Component Value Range   WBC 8.6  4.0 - 10.5 K/uL   RBC 3.70 (*) 3.87 - 5.11 MIL/uL   Hemoglobin 11.3 (*) 12.0 - 15.0 g/dL   HCT 86.5 (*) 78.4 - 69.6 %   MCV 91.4  78.0 - 100.0 fL   MCH 30.5  26.0 - 34.0 pg   MCHC 33.4  30.0 - 36.0 g/dL   RDW 29.5  28.4 - 13.2 %   Platelets 330  150 - 400 K/uL  COMPREHENSIVE METABOLIC PANEL     Status: Abnormal   Collection Time   04/08/12 10:27 AM      Component Value Range   Sodium 140  135 - 145 mEq/L   Potassium 3.8  3.5 - 5.1 mEq/L   Chloride 102  96 - 112 mEq/L   CO2 29  19 - 32 mEq/L   Glucose, Bld 103 (*) 70 - 99 mg/dL   BUN 11  6 - 23 mg/dL   Creatinine, Ser 4.40  0.50 - 1.10 mg/dL   Calcium 9.0  8.4 - 10.2 mg/dL   Total Protein 6.5  6.0 - 8.3 g/dL   Albumin 3.2 (*) 3.5 - 5.2 g/dL   AST 20  0 - 37 U/L   ALT 18  0 - 35 U/L   Alkaline Phosphatase 92  39 - 117 U/L   Total Bilirubin 0.7  0.3 - 1.2 mg/dL   GFR calc non Af Amer 72 (*) >90 mL/min   GFR calc Af Amer 83 (*) >90 mL/min  URIC ACID     Status: Abnormal   Collection Time   04/08/12 10:27 AM      Component Value Range   Uric Acid, Serum 7.5 (*) 2.4 - 7.0 mg/dL  LACTATE DEHYDROGENASE     Status: Normal   Collection Time   04/08/12 10:27 AM      Component Value Range   LDH 206  94 - 250 U/L   Filed Vitals:   04/08/12 1007  04/08/12 1021 04/08/12 1044  BP: 169/106 165/100 184/92  Pulse: 66 64 59  Temp: 98.3 F (36.8 C)    TempSrc: Oral    Resp: 20 20      MAU Course  Procedures  MDM 1045  Consult with Dr. Clearance Coots re: plan of care  1420  Have given multiple meds for headache with minimal relief. BP now elevated to 200/99.  Discussed with Dr Clearance Coots. Will admit for Mag Sulfate.   Assessment and  Plan  A:  Postpartum      Severe range Hypertension, possible PP preeclampsia P:  Admit      Magnesium Sulfate infusion  BURLESON,TERRI 04/08/2012, 10:46 AM

## 2012-04-09 ENCOUNTER — Encounter (HOSPITAL_COMMUNITY): Payer: Self-pay | Admitting: Obstetrics

## 2012-04-09 MED ORDER — OXYCODONE-ACETAMINOPHEN 5-325 MG PO TABS: 1.0000 | ORAL_TABLET | ORAL | Status: DC | PRN

## 2012-04-09 MED ORDER — NEBIVOLOL HCL 10 MG PO TABS
10.0000 mg | ORAL_TABLET | Freq: Every day | ORAL | Status: DC
  Administered 2012-04-09 – 2012-04-11 (×3): 10 mg via ORAL
  Filled 2012-04-09 (×3): qty 1

## 2012-04-09 MED ORDER — HYDROCHLOROTHIAZIDE 25 MG PO TABS
25.0000 mg | ORAL_TABLET | Freq: Every day | ORAL | Status: DC
  Administered 2012-04-09 – 2012-04-11 (×3): 25 mg via ORAL
  Filled 2012-04-09 (×3): qty 1

## 2012-04-09 MED ORDER — PRENATAL MULTIVITAMIN CH: 1.0000 | ORAL_TABLET | Freq: Every day | ORAL | Status: DC

## 2012-04-09 NOTE — H&P (Signed)
  Post Partum Day 11.   S/P C/S on 03-28-12. RH status/Rubella reviewed.  Feeding: unknown Subjective: No HA, SOB, CP, F/C, breast symptoms. Normal vaginal bleeding, no clots.     Objective: BP 143/75  Pulse 70  Temp 97.5 F (36.4 C) (Oral)  Resp 20  Ht 5\' 8"  (1.727 m)  Wt 155.402 kg (342 lb 9.6 oz)  BMI 52.09 kg/m2  SpO2 100%  LMP 06/22/2011  Breastfeeding? Yes I&O reviewed.  DTRS:2+  Physical Exam:  General: alert and no distress Lochia: appropriate Uterine Fundus: firm DVT Evaluation: No evidence of DVT seen on physical exam. Ext: No c/c/e  Basename 04/08/12 1027  HGB 11.3*  HCT 33.8*      Assessment/Plan: 31 y.o.  PPD #11    Chronic HTN.  Postpartum superimposed preeclampsia. Admitted for BP control.  Seizure prophylaxis instituted with Magnesium Sulfate. Continue current postpartum care Continue Mg Ambulate   LOS: 1 day   Avonell Lenig A 04/09/2012, 9:49 AM

## 2012-04-09 NOTE — Progress Notes (Signed)
Post Partum Day 11 Subjective: no complaints  Objective: Blood pressure 143/75, pulse 70, temperature 97.5 F (36.4 C), temperature source Oral, resp. rate 20, height 5\' 8"  (1.727 m), weight 155.402 kg (342 lb 9.6 oz), last menstrual period 06/22/2011, SpO2 100.00%, currently breastfeeding.  Physical Exam:  General: alert and no distress Lochia: appropriate Uterine Fundus: firm Incision: healing well DVT Evaluation: No evidence of DVT seen on physical exam.   Basename 04/08/12 1027  HGB 11.3*  HCT 33.8*    Assessment/Plan: Postpartum preeclampsia.  Stable.  Continue Magnesium Sulfate..   LOS: 1 day   Kendle Erker A 04/09/2012, 9:57 AM

## 2012-04-09 NOTE — Progress Notes (Signed)
Pt talking on the phone. 

## 2012-04-10 MED ORDER — AMLODIPINE BESYLATE 5 MG PO TABS
5.0000 mg | ORAL_TABLET | Freq: Every day | ORAL | Status: DC
  Administered 2012-04-10 – 2012-04-11 (×2): 5 mg via ORAL
  Filled 2012-04-10 (×2): qty 1

## 2012-04-10 MED ORDER — OXYCODONE HCL 5 MG PO TABS
10.0000 mg | ORAL_TABLET | ORAL | Status: DC | PRN
  Administered 2012-04-10 – 2012-04-11 (×4): 10 mg via ORAL
  Filled 2012-04-10: qty 2
  Filled 2012-04-10 (×2): qty 1
  Filled 2012-04-10 (×2): qty 2
  Filled 2012-04-10 (×2): qty 1

## 2012-04-10 MED ORDER — CALCIUM CARBONATE ANTACID 500 MG PO CHEW
2.0000 | CHEWABLE_TABLET | Freq: Three times a day (TID) | ORAL | Status: DC | PRN
  Administered 2012-04-10: 400 mg via ORAL
  Filled 2012-04-10 (×2): qty 1

## 2012-04-10 MED ORDER — PANTOPRAZOLE SODIUM 40 MG PO TBEC
40.0000 mg | DELAYED_RELEASE_TABLET | Freq: Two times a day (BID) | ORAL | Status: DC
Start: 2012-04-10 — End: 2012-04-11
  Administered 2012-04-10: 40 mg via ORAL
  Filled 2012-04-10 (×2): qty 1

## 2012-04-10 NOTE — Progress Notes (Signed)
Post Partum Day 12 Subjective: no complaints.  No HA.  Objective: Blood pressure 152/71, pulse 59, temperature 98.5 F (36.9 C), temperature source Oral, resp. rate 18, height 5\' 8"  (1.727 m), weight 155.402 kg (342 lb 9.6 oz), last menstrual period 06/22/2011, SpO2 97.00%, currently breastfeeding.  Physical Exam:  General: alert and no distress Lochia: appropriate Uterine Fundus: firm Incision: healing well DVT Evaluation: No evidence of DVT seen on physical exam.   Basename 04/08/12 1027  HGB 11.3*  HCT 33.8*    Assessment/Plan: Plan for discharge tomorrow   LOS: 2 days   HARPER,CHARLES A 04/10/2012, 8:53 AM

## 2012-04-10 NOTE — Progress Notes (Signed)
Dr. Clearance Coots made aware-no new orders received.

## 2012-04-11 MED ORDER — BUTALBITAL-APAP-CAFFEINE 50-325-40 MG PO TABS: 2.0000 | ORAL_TABLET | Freq: Four times a day (QID) | ORAL | Status: AC | PRN

## 2012-04-11 MED ORDER — AMLODIPINE BESYLATE 5 MG PO TABS: 5.0000 mg | ORAL_TABLET | Freq: Every day | ORAL | Status: DC

## 2012-04-11 MED ORDER — NEBIVOLOL HCL 10 MG PO TABS: 10.0000 mg | ORAL_TABLET | Freq: Every day | ORAL | Status: DC

## 2012-04-11 MED ORDER — OXYCODONE HCL 10 MG PO TABS: 10.0000 mg | ORAL_TABLET | Freq: Four times a day (QID) | ORAL | Status: DC | PRN

## 2012-04-11 NOTE — Plan of Care (Signed)
Problem: Consults Goal: General Medical Patient Education See Patient Education Module for specific education.  Outcome: Completed/Met Date Met:  04/11/12 PIH/HTN

## 2012-04-11 NOTE — Progress Notes (Signed)
Post Partum Day 13 Subjective: no complaints  Objective: Blood pressure 137/69, pulse 58, temperature 98.2 F (36.8 C), temperature source Oral, resp. rate 16, height 5\' 8"  (1.727 m), weight 155.402 kg (342 lb 9.6 oz), last menstrual period 06/22/2011, SpO2 98.00%, currently breastfeeding.  Physical Exam:  General: alert and no distress Lochia: appropriate Uterine Fundus: firm Incision: healing well DVT Evaluation: No evidence of DVT seen on physical exam.   Basename 04/08/12 1027  HGB 11.3*  HCT 33.8*    Assessment/Plan: Discharge home   LOS: 3 days   Sydney Griffin A 04/11/2012, 8:57 AM

## 2012-04-11 NOTE — Progress Notes (Signed)
Post discharge chart review completed.  

## 2012-04-11 NOTE — Discharge Summary (Signed)
Obstetric Discharge Summary Reason for Admission: Postpartum preeclampsia. Prenatal Procedures: ultrasound Intrapartum Procedures: cesarean: low cervical, transverse Postpartum Procedures: Magnesium sulfate. Complications-Operative and Postpartum: Preeclampsia. Hemoglobin  Date Value Range Status  04/08/2012 11.3* 12.0 - 15.0 g/dL Final     HCT  Date Value Range Status  04/08/2012 33.8* 36.0 - 46.0 % Final    Physical Exam:  General: alert and no distress Lochia: appropriate Uterine Fundus: firm Incision: healing well DVT Evaluation: No evidence of DVT seen on physical exam.  Discharge Diagnoses: Term Pregnancy-delivered, Preelampsia and Chronic Hypertension.  Discharge Information: Date: 04/11/2012 Activity: pelvic rest Diet: routine Medications: PNV, Ibuprofen, Percocet and Bystolic, HCTZ, Norvasc. Condition: improved Instructions: refer to practice specific booklet Discharge to: home Follow-up Information    Follow up with HARPER,CHARLES A, MD. Schedule an appointment as soon as possible for a visit in 4 weeks.   Contact information:   [address] [place] [address] [phone]          Newborn Data: This patient has no babies on file. Home with mother.  HARPER,CHARLES A 04/11/2012, 9:03 AM

## 2012-05-05 ENCOUNTER — Other Ambulatory Visit: Payer: Self-pay | Admitting: Obstetrics & Gynecology

## 2012-05-06 ENCOUNTER — Other Ambulatory Visit: Payer: Self-pay | Admitting: Obstetrics

## 2012-05-06 DIAGNOSIS — D699 Hemorrhagic condition, unspecified: Secondary | ICD-10-CM

## 2012-05-06 DIAGNOSIS — Z98891 History of uterine scar from previous surgery: Secondary | ICD-10-CM

## 2012-05-12 ENCOUNTER — Ambulatory Visit (HOSPITAL_COMMUNITY): Admission: RE | Admit: 2012-05-12 | Payer: Medicaid Other | Source: Ambulatory Visit

## 2012-05-13 ENCOUNTER — Ambulatory Visit (HOSPITAL_COMMUNITY)
Admission: RE | Admit: 2012-05-13 | Discharge: 2012-05-13 | Disposition: A | Discharge status: Home / Self Care | Payer: Medicaid Other | Source: Ambulatory Visit | Attending: Obstetrics | Admitting: Obstetrics

## 2012-05-13 DIAGNOSIS — Z98891 History of uterine scar from previous surgery: Secondary | ICD-10-CM

## 2012-05-13 DIAGNOSIS — N949 Unspecified condition associated with female genital organs and menstrual cycle: Secondary | ICD-10-CM | POA: Insufficient documentation

## 2012-05-13 DIAGNOSIS — D699 Hemorrhagic condition, unspecified: Secondary | ICD-10-CM

## 2012-05-16 ENCOUNTER — Ambulatory Visit (HOSPITAL_COMMUNITY): Payer: Medicaid Other

## 2012-09-05 ENCOUNTER — Inpatient Hospital Stay (HOSPITAL_COMMUNITY): Payer: Medicaid Other

## 2012-09-05 ENCOUNTER — Inpatient Hospital Stay (HOSPITAL_COMMUNITY)
Admission: AD | Admit: 2012-09-05 | Discharge: 2012-09-05 | Disposition: A | Discharge status: Home / Self Care | Payer: Medicaid Other | Source: Ambulatory Visit | Attending: Obstetrics | Admitting: Obstetrics

## 2012-09-05 ENCOUNTER — Encounter (HOSPITAL_COMMUNITY): Payer: Self-pay

## 2012-09-05 DIAGNOSIS — R109 Unspecified abdominal pain: Secondary | ICD-10-CM

## 2012-09-05 DIAGNOSIS — Z3490 Encounter for supervision of normal pregnancy, unspecified, unspecified trimester: Secondary | ICD-10-CM

## 2012-09-05 DIAGNOSIS — O26899 Other specified pregnancy related conditions, unspecified trimester: Secondary | ICD-10-CM

## 2012-09-05 DIAGNOSIS — O99891 Other specified diseases and conditions complicating pregnancy: Secondary | ICD-10-CM | POA: Insufficient documentation

## 2012-09-05 LAB — CBC WITH DIFFERENTIAL/PLATELET
Basophils Absolute: 0 10*3/uL (ref 0.0–0.1)
Eosinophils Absolute: 0.2 10*3/uL (ref 0.0–0.7)
Eosinophils Relative: 2 % (ref 0–5)
Lymphocytes Relative: 26 % (ref 12–46)
Lymphs Abs: 2.6 10*3/uL (ref 0.7–4.0)
MCV: 92.4 fL (ref 78.0–100.0)
Neutrophils Relative %: 62 % (ref 43–77)
Platelets: 240 10*3/uL (ref 150–400)
RBC: 3.84 MIL/uL — ABNORMAL LOW (ref 3.87–5.11)
RDW: 14.3 % (ref 11.5–15.5)
WBC: 10.1 10*3/uL (ref 4.0–10.5)

## 2012-09-05 LAB — WET PREP, GENITAL
Trich, Wet Prep: NONE SEEN
Yeast Wet Prep HPF POC: NONE SEEN

## 2012-09-05 LAB — URINALYSIS, ROUTINE W REFLEX MICROSCOPIC
Bilirubin Urine: NEGATIVE
Leukocytes, UA: NEGATIVE
Nitrite: NEGATIVE
Specific Gravity, Urine: 1.025 (ref 1.005–1.030)
Urobilinogen, UA: 0.2 mg/dL (ref 0.0–1.0)
pH: 7 (ref 5.0–8.0)

## 2012-09-05 LAB — HCG, QUANTITATIVE, PREGNANCY: hCG, Beta Chain, Quant, S: 16571 m[IU]/mL — ABNORMAL HIGH (ref <5)

## 2012-09-05 NOTE — MAU Note (Signed)
Patient states she has had a positive home pregnancy test. Has had cramping for about one week. Denies any bleeding or discharge.

## 2012-09-05 NOTE — MAU Provider Note (Signed)
History     CSN: [id number]  Arrival date and time: 09/05/12 1318   First Provider Initiated Contact with Patient 09/05/12 1621      Chief Complaint  Patient presents with  . Possible Pregnancy  . Abdominal Cramping   HPI Sydney Griffin is 31 y.o. [phone] [redacted]w[redacted]d weeks presents for abdominal cramping.  Patient of Dr. Tamela Oddi.  Called office and told they couldn't see her until Thursday.  Patient felt she needed to be seen. Cramping began 2 days ago, describes as intermittent.  Points to lower abdomen bilaterally.  Denies vaginal bleeding or discharge.  Patient doesn't worry about STIs.  Has appt to begin prenatal care for 1/15. Slight nausea.  Vomited only once yesterday.     Past Medical History  Diagnosis Date  . Acid reflux   . Menstrual bleeding problem   . Headache   . Ectopic pregnancy     Had MTX  . Asthma May 2013    info from [facility] states asthma but pt denies and said she had bronchitis, used an Hydrologist while sick and has now lost the inhaler.  . Hypertension     Past Surgical History  Procedure Date  . Cesarean section   . Dilation and curettage of uterus   . Laparoscopy   . Cesarean section 03/28/2012    Procedure: CESAREAN SECTION;  Surgeon: Antionette Char, MD;  Location: [facility];  Service: Gynecology;  Laterality: N/A;    Family History  Problem Relation Age of Onset  . Anesthesia problems Neg Hx   . Other Neg Hx   . Hypertension Mother   . Diabetes Mother   . Diabetes Father   . Hypertension Brother     History  Substance Use Topics  . Smoking status: Current Every Day Smoker -- 0.5 packs/day    Types: Cigarettes  . Smokeless tobacco: Never Used  . Alcohol Use: No    Allergies:  Allergies  Allergen Reactions  . Aspirin Shortness Of Breath  . Dilaudid (Hydromorphone Hcl) Other (See Comments)    Pt says medication causes her to shake uncontrollably.  . Ibuprofen     Gi upset    Prescriptions prior to admission  Medication Sig  Dispense Refill  . acetaminophen (TYLENOL) 500 MG tablet Take 1,000 mg by mouth every 6 (six) hours as needed. For pain      . amLODipine (NORVASC) 5 MG tablet Take 1 tablet (5 mg total) by mouth daily.  30 tablet  11  . butalbital-acetaminophen-caffeine (FIORICET) 50-325-40 MG per tablet Take 2 tablets by mouth every 6 (six) hours as needed for headache.  40 tablet  2  . calcium carbonate (TUMS - DOSED IN MG ELEMENTAL CALCIUM) 500 MG chewable tablet Chew 1 tablet by mouth daily. heartburn      . esomeprazole (NEXIUM) 40 MG capsule Take 40 mg by mouth daily before breakfast.       . hydrochlorothiazide (HYDRODIURIL) 25 MG tablet Take 1 tablet (25 mg total) by mouth daily.  30 tablet  3  . nebivolol (BYSTOLIC) 10 MG tablet Take 1 tablet (10 mg total) by mouth daily.  30 tablet  11  . Oxycodone HCl 10 MG TABS Take 1 tablet (10 mg total) by mouth every 6 (six) hours as needed.  40 tablet  0  . Prenatal Vit-Fe Fumarate-FA (PRENATAL MULTIVITAMIN) TABS Take 1 tablet by mouth daily.      . [DISCONTINUED] nebivolol (BYSTOLIC) 10 MG tablet Take 10 mg by mouth daily.  Review of Systems  Constitutional: Negative.   Respiratory: Negative.   Cardiovascular: Negative.   Gastrointestinal: Positive for abdominal pain. Vomiting: cramping.  Genitourinary:       Neg for bleeding or discharge   Physical Exam   Blood pressure 142/70, pulse 65, temperature 98.1 F (36.7 C), temperature source Oral, resp. rate 20, height 5\' 7"  (1.702 m), weight 351 lb 12.8 oz (159.575 kg), last menstrual period 07/13/2012, SpO2 100.00%.  Physical Exam  Constitutional: She is oriented to person, place, and time. She appears well-developed and well-nourished. No distress.  HENT:  Head: Normocephalic.  Neck: Normal range of motion.  Cardiovascular: Normal rate.   Respiratory: Effort normal.  GI: Soft. She exhibits no distension and no mass. There is tenderness (right lower pain). There is no rebound and no guarding.    Genitourinary: There is no tenderness or lesion on the right labia. There is no tenderness or lesion on the left labia. Uterus is enlarged (slightly). Uterus is not tender. Cervix exhibits no discharge. Right adnexum displays no mass, no tenderness and no fullness. Left adnexum displays no mass, no tenderness and no fullness. No bleeding around the vagina. Vaginal discharge: small amount of white discharge with mild odor.  Neurological: She is alert and oriented to person, place, and time.  Skin: Skin is warm and dry.  Psychiatric: She has a normal mood and affect. Her behavior is normal.   Results for orders placed during the hospital encounter of 09/05/12 (from the past 24 hour(s))  URINALYSIS, ROUTINE W REFLEX MICROSCOPIC     Status: Normal   Collection Time   09/05/12  2:49 PM      Component Value Range   Color, Urine YELLOW  YELLOW   APPearance CLEAR  CLEAR   Specific Gravity, Urine 1.025  1.005 - 1.030   pH 7.0  5.0 - 8.0   Glucose, UA NEGATIVE  NEGATIVE mg/dL   Hgb urine dipstick NEGATIVE  NEGATIVE   Bilirubin Urine NEGATIVE  NEGATIVE   Ketones, ur NEGATIVE  NEGATIVE mg/dL   Protein, ur NEGATIVE  NEGATIVE mg/dL   Urobilinogen, UA 0.2  0.0 - 1.0 mg/dL   Nitrite NEGATIVE  NEGATIVE   Leukocytes, UA NEGATIVE  NEGATIVE  POCT PREGNANCY, URINE     Status: Abnormal   Collection Time   09/05/12  2:53 PM      Component Value Range   Preg Test, Ur POSITIVE (*) NEGATIVE  WET PREP, GENITAL     Status: Abnormal   Collection Time   09/05/12  4:35 PM      Component Value Range   Yeast Wet Prep HPF POC NONE SEEN  NONE SEEN   Trich, Wet Prep NONE SEEN  NONE SEEN   Clue Cells Wet Prep HPF POC NONE SEEN  NONE SEEN   WBC, Wet Prep HPF POC FEW (*) NONE SEEN   BLOOD TYPE from previous record is O Positive   MAU Course  Procedures  GC/CHL culture to lab  MDM  Assessment and Plan  A:  Cramping in early pregnancy       Viable Intrauterine Pregnancy [redacted]w[redacted]d  This is behind dating by LMP by  [redacted]w[redacted]d  P:  Tylenol prn for discomfort       Make prenatal appointment to begin prenatal care  Urie Loughner,EVE M 09/05/2012, 4:21 PM

## 2012-09-07 ENCOUNTER — Encounter (HOSPITAL_COMMUNITY): Payer: Self-pay | Admitting: *Deleted

## 2012-09-07 ENCOUNTER — Inpatient Hospital Stay (HOSPITAL_COMMUNITY)
Admission: AD | Admit: 2012-09-07 | Discharge: 2012-09-07 | Disposition: A | Discharge status: Home / Self Care | Payer: Medicaid Other | Source: Ambulatory Visit | Attending: Obstetrics & Gynecology | Admitting: Obstetrics & Gynecology

## 2012-09-07 DIAGNOSIS — O99891 Other specified diseases and conditions complicating pregnancy: Secondary | ICD-10-CM | POA: Insufficient documentation

## 2012-09-07 DIAGNOSIS — M545 Low back pain, unspecified: Secondary | ICD-10-CM | POA: Insufficient documentation

## 2012-09-07 DIAGNOSIS — R51 Headache: Secondary | ICD-10-CM | POA: Insufficient documentation

## 2012-09-07 DIAGNOSIS — J111 Influenza due to unidentified influenza virus with other respiratory manifestations: Secondary | ICD-10-CM

## 2012-09-07 DIAGNOSIS — E669 Obesity, unspecified: Secondary | ICD-10-CM | POA: Insufficient documentation

## 2012-09-07 DIAGNOSIS — O10019 Pre-existing essential hypertension complicating pregnancy, unspecified trimester: Secondary | ICD-10-CM | POA: Insufficient documentation

## 2012-09-07 LAB — CBC WITH DIFFERENTIAL/PLATELET
HCT: 36.5 % (ref 36.0–46.0)
Hemoglobin: 12.2 g/dL (ref 12.0–15.0)
Lymphocytes Relative: 14 % (ref 12–46)
Lymphs Abs: 1.1 10*3/uL (ref 0.7–4.0)
Monocytes Relative: 9 % (ref 3–12)
Neutro Abs: 5.8 10*3/uL (ref 1.7–7.7)
Neutrophils Relative %: 76 % (ref 43–77)
RBC: 3.96 MIL/uL (ref 3.87–5.11)

## 2012-09-07 LAB — URINALYSIS, ROUTINE W REFLEX MICROSCOPIC
Glucose, UA: NEGATIVE mg/dL
Leukocytes, UA: NEGATIVE
Nitrite: NEGATIVE
Specific Gravity, Urine: 1.015 (ref 1.005–1.030)
pH: 7 (ref 5.0–8.0)

## 2012-09-07 MED ORDER — GUAIFENESIN-DM 100-10 MG/5ML PO SYRP: 5.0000 mL | ORAL_SOLUTION | Freq: Three times a day (TID) | ORAL | Status: DC | PRN

## 2012-09-07 MED ORDER — ACETAMINOPHEN-CODEINE #3 300-30 MG PO TABS: 1.0000 | ORAL_TABLET | ORAL | Status: DC | PRN

## 2012-09-07 MED ORDER — DEXTROMETHORPHAN POLISTIREX 30 MG/5ML PO LQCR: 60.0000 mg | ORAL | Status: DC | PRN

## 2012-09-07 MED ORDER — OSELTAMIVIR PHOSPHATE 75 MG PO CAPS
75.0000 mg | ORAL_CAPSULE | Freq: Once | ORAL | Status: AC
  Administered 2012-09-07: 75 mg via ORAL
  Filled 2012-09-07: qty 1

## 2012-09-07 MED ORDER — OSELTAMIVIR PHOSPHATE 75 MG PO CAPS: 75.0000 mg | ORAL_CAPSULE | Freq: Every day | ORAL | Status: DC

## 2012-09-07 MED ORDER — OSELTAMIVIR PHOSPHATE 75 MG PO CAPS: 75.0000 mg | ORAL_CAPSULE | ORAL | Status: DC

## 2012-09-07 MED ORDER — BUTALBITAL-APAP-CAFFEINE 50-325-40 MG PO TABS
2.0000 | ORAL_TABLET | Freq: Once | ORAL | Status: AC
  Administered 2012-09-07: 2 via ORAL
  Filled 2012-09-07: qty 2

## 2012-09-07 NOTE — MAU Provider Note (Signed)
CC: Emesis, Chills, Headache and Generalized Body Aches    First Provider Initiated Contact with Patient 09/07/12 1645      HPI Sydney Griffin is a 32 y.o. [id number] [redacted]w[redacted]d who presents with abrupt onset last evening of chills/fever of 100.3, body aches, headache, LBP and cough. Cough is loose, at times "vomits" mucus. Has been treated with Percocet/Flexeril for back ache 2 months ago but has not taken anything PTA. Gets headaches and has relief from Fiorocet. No SOB or CP. Has not had flu vaccine. No known household contact or known flu exposure. No bleeding or cramping.   Past Medical History  Diagnosis Date  . Acid reflux   . Menstrual bleeding problem   . Headache   . Ectopic pregnancy     Had MTX  . Asthma May 2013    info from [facility] states asthma but pt denies and said she had bronchitis, used an Hydrologist while sick and has now lost the inhaler.  . Hypertension     OB History    Grav Para Term Preterm Abortions TAB SAB Ect Mult Living   4 2 2  0 1   1  2      # Outc Date GA Lbr Len/2nd Wgt Sex Del Anes PTL Lv   1 TRM 7/13 [redacted]w[redacted]d 00:00 5lb10oz(2.55kg) F LTCS Spinal  Yes   2 CUR            3 ECT            4 TRM               Past Surgical History  Procedure Date  . Cesarean section   . Dilation and curettage of uterus   . Laparoscopy   . Cesarean section 03/28/2012    Procedure: CESAREAN SECTION;  Surgeon: Antionette Char, MD;  Location: [facility];  Service: Gynecology;  Laterality: N/A;    History   Social History  . Marital Status: Single    Spouse Name: N/A    Number of Children: N/A  . Years of Education: N/A   Occupational History  . Not on file.   Social History Main Topics  . Smoking status: Current Every Day Smoker -- 0.5 packs/day    Types: Cigarettes  . Smokeless tobacco: Never Used  . Alcohol Use: No  . Drug Use: No  . Sexually Active: Yes    Birth Control/ Protection: None   Other Topics Concern  . Not on file   Social History Narrative  .  No narrative on file    No current facility-administered medications on file prior to encounter.   Current Outpatient Prescriptions on File Prior to Encounter  Medication Sig Dispense Refill  . acetaminophen (TYLENOL) 500 MG tablet Take 1,000 mg by mouth every 6 (six) hours as needed. For pain      . amLODipine (NORVASC) 5 MG tablet Take 1 tablet (5 mg total) by mouth daily.  30 tablet  11  . butalbital-acetaminophen-caffeine (FIORICET) 50-325-40 MG per tablet Take 2 tablets by mouth every 6 (six) hours as needed for headache.  40 tablet  2  . calcium carbonate (TUMS - DOSED IN MG ELEMENTAL CALCIUM) 500 MG chewable tablet Chew 1 tablet by mouth daily. heartburn      . esomeprazole (NEXIUM) 40 MG capsule Take 40 mg by mouth daily before breakfast.       . hydrochlorothiazide (HYDRODIURIL) 25 MG tablet Take 1 tablet (25 mg total) by mouth daily.  30 tablet  3  . nebivolol (BYSTOLIC) 10 MG tablet Take 1 tablet (10 mg total) by mouth daily.  30 tablet  11  . Oxycodone HCl 10 MG TABS Take 1 tablet (10 mg total) by mouth every 6 (six) hours as needed.  40 tablet  0  . Prenatal Vit-Fe Fumarate-FA (PRENATAL MULTIVITAMIN) TABS Take 1 tablet by mouth daily.        Allergies  Allergen Reactions  . Aspirin Shortness Of Breath  . Dilaudid (Hydromorphone Hcl) Other (See Comments)    Pt says medication causes her to shake uncontrollably.  . Ibuprofen     Gi upset    ROS Pertinent items in HPI  PHYSICAL EXAM Filed Vitals:   09/07/12 1539  BP: 160/91  Pulse: 92  Temp: 99.6 F (37.6 C)  Resp: 20   General: Well nourished, well developed female in no acute distress Cardiovascular: Normal rate Respiratory: Normal effort Abdomen: Soft, nontender Back: No CVAT Extremities: No edema Neurologic: Alert and oriented Speculum exam: NEFG; vagina with physiologic discharge, no blood; cervix clean Bimanual exam: cervix closed, no CMT; uterus NSSP; no adnexal tenderness or masses  LAB  RESULTS Results for orders placed during the hospital encounter of 09/07/12 (from the past 24 hour(s))  URINALYSIS, ROUTINE W REFLEX MICROSCOPIC     Status: Normal   Collection Time   09/07/12  3:23 PM      Component Value Range   Color, Urine YELLOW  YELLOW   APPearance CLEAR  CLEAR   Specific Gravity, Urine 1.015  1.005 - 1.030   pH 7.0  5.0 - 8.0   Glucose, UA NEGATIVE  NEGATIVE mg/dL   Hgb urine dipstick NEGATIVE  NEGATIVE   Bilirubin Urine NEGATIVE  NEGATIVE   Ketones, ur NEGATIVE  NEGATIVE mg/dL   Protein, ur NEGATIVE  NEGATIVE mg/dL   Urobilinogen, UA 0.2  0.0 - 1.0 mg/dL   Nitrite NEGATIVE  NEGATIVE   Leukocytes, UA NEGATIVE  NEGATIVE  CBC WITH DIFFERENTIAL     Status: Normal   Collection Time   09/07/12  4:53 PM      Component Value Range   WBC 7.7  4.0 - 10.5 K/uL   RBC 3.96  3.87 - 5.11 MIL/uL   Hemoglobin 12.2  12.0 - 15.0 g/dL   HCT 16.1  09.6 - 04.5 %   MCV 92.2  78.0 - 100.0 fL   MCH 30.8  26.0 - 34.0 pg   MCHC 33.4  30.0 - 36.0 g/dL   RDW 40.9  81.1 - 91.4 %   Platelets 215  150 - 400 K/uL   Neutrophils Relative 76  43 - 77 %   Neutro Abs 5.8  1.7 - 7.7 K/uL   Lymphocytes Relative 14  12 - 46 %   Lymphs Abs 1.1  0.7 - 4.0 K/uL   Monocytes Relative 9  3 - 12 %   Monocytes Absolute 0.7  0.1 - 1.0 K/uL   Eosinophils Relative 1  0 - 5 %   Eosinophils Absolute 0.1  0.0 - 0.7 K/uL   Basophils Relative 0  0 - 1 %   Basophils Absolute 0.0  0.0 - 0.1 K/uL     MAU COURSE Fiorocet given with some relief Initial dose Tamiflu given in MAU  ASSESSMENT  1. Flu syndrome   Early pregnancy Headaches Morbid obesity CHTN  PLAN Discharge home. See AVS for patient education. D/W Dr. Gaynell Face Follow-up Information    Call [facility]. (Call office tomorrow  to discuss your antihypertensive medicines.Keep track of your temperature if you get fever)    Contact information:   [address] [place] [address] [phone]           Medication List     As of 09/07/2012  5:59 PM    STOP taking these medications         hydrochlorothiazide 25 MG tablet   Commonly known as: HYDRODIURIL      TAKE these medications         acetaminophen 500 MG tablet   Commonly known as: TYLENOL   Take 1,000 mg by mouth every 6 (six) hours as needed. For pain      acetaminophen-codeine 300-30 MG per tablet   Commonly known as: TYLENOL #3   Take 1 tablet by mouth every 4 (four) hours as needed for pain.      amLODipine 5 MG tablet   Commonly known as: NORVASC   Take 1 tablet (5 mg total) by mouth daily.      butalbital-acetaminophen-caffeine 50-325-40 MG per tablet   Commonly known as: FIORICET, ESGIC   Take 2 tablets by mouth every 6 (six) hours as needed for headache.      calcium carbonate 500 MG chewable tablet   Commonly known as: TUMS - dosed in mg elemental calcium   Chew 1 tablet by mouth daily. heartburn      dextromethorphan 30 MG/5ML liquid   Commonly known as: DELSYM   Take 10 mLs (60 mg total) by mouth as needed for cough.      esomeprazole 40 MG capsule   Commonly known as: NEXIUM   Take 40 mg by mouth daily before breakfast.      guaiFENesin-dextromethorphan 100-10 MG/5ML syrup   Commonly known as: ROBITUSSIN DM   Take 5 mLs by mouth 3 (three) times daily as needed for cough.      nebivolol 10 MG tablet   Commonly known as: BYSTOLIC   Take 1 tablet (10 mg total) by mouth daily.      oseltamivir 75 MG capsule   Commonly known as: TAMIFLU   Take 1 capsule (75 mg total) by mouth 1 day or 1 dose.      oseltamivir 75 MG capsule   Commonly known as: TAMIFLU   Take 1 capsule (75 mg total) by mouth daily.      Oxycodone HCl 10 MG Tabs   Take 1 tablet (10 mg total) by mouth every 6 (six) hours as needed.      prenatal multivitamin Tabs   Take 1 tablet by mouth daily.           Danae Orleans, CNM 09/07/2012 4:53 PM

## 2012-09-07 NOTE — MAU Note (Signed)
Pt states that she started having theses symptoms yesterday evening.

## 2012-10-22 ENCOUNTER — Other Ambulatory Visit: Payer: Self-pay

## 2012-11-10 ENCOUNTER — Other Ambulatory Visit: Payer: Self-pay | Admitting: Obstetrics & Gynecology

## 2012-11-10 DIAGNOSIS — O0990 Supervision of high risk pregnancy, unspecified, unspecified trimester: Secondary | ICD-10-CM

## 2012-11-21 ENCOUNTER — Ambulatory Visit (HOSPITAL_COMMUNITY)
Admission: RE | Admit: 2012-11-21 | Discharge: 2012-11-21 | Disposition: A | Discharge status: Home / Self Care | Payer: Medicaid Other | Source: Ambulatory Visit | Attending: Obstetrics & Gynecology | Admitting: Obstetrics & Gynecology

## 2012-11-21 DIAGNOSIS — O10019 Pre-existing essential hypertension complicating pregnancy, unspecified trimester: Secondary | ICD-10-CM | POA: Insufficient documentation

## 2012-11-21 DIAGNOSIS — E669 Obesity, unspecified: Secondary | ICD-10-CM | POA: Insufficient documentation

## 2012-11-21 DIAGNOSIS — O358XX0 Maternal care for other (suspected) fetal abnormality and damage, not applicable or unspecified: Secondary | ICD-10-CM | POA: Insufficient documentation

## 2012-11-21 DIAGNOSIS — Z1389 Encounter for screening for other disorder: Secondary | ICD-10-CM | POA: Insufficient documentation

## 2012-11-21 DIAGNOSIS — Z363 Encounter for antenatal screening for malformations: Secondary | ICD-10-CM | POA: Insufficient documentation

## 2012-11-21 DIAGNOSIS — O34219 Maternal care for unspecified type scar from previous cesarean delivery: Secondary | ICD-10-CM | POA: Insufficient documentation

## 2012-11-21 DIAGNOSIS — O99210 Obesity complicating pregnancy, unspecified trimester: Secondary | ICD-10-CM | POA: Insufficient documentation

## 2012-11-23 ENCOUNTER — Ambulatory Visit: Payer: Self-pay | Admitting: Neurology

## 2012-12-04 ENCOUNTER — Encounter: Payer: Self-pay | Admitting: *Deleted

## 2012-12-07 ENCOUNTER — Ambulatory Visit (INDEPENDENT_AMBULATORY_CARE_PROVIDER_SITE_OTHER): Payer: Medicaid Other | Admitting: Obstetrics & Gynecology

## 2012-12-07 ENCOUNTER — Encounter: Payer: Self-pay | Admitting: Obstetrics & Gynecology

## 2012-12-07 VITALS — BP 107/71 | Temp 97.8°F | Wt 330.0 lb

## 2012-12-07 DIAGNOSIS — Z3480 Encounter for supervision of other normal pregnancy, unspecified trimester: Secondary | ICD-10-CM | POA: Insufficient documentation

## 2012-12-07 DIAGNOSIS — O169 Unspecified maternal hypertension, unspecified trimester: Secondary | ICD-10-CM

## 2012-12-07 DIAGNOSIS — O162 Unspecified maternal hypertension, second trimester: Secondary | ICD-10-CM

## 2012-12-07 LAB — POCT URINALYSIS DIPSTICK
Blood, UA: NEGATIVE
Glucose, UA: NEGATIVE
Nitrite, UA: NEGATIVE
Spec Grav, UA: 1.015
Urobilinogen, UA: NEGATIVE

## 2012-12-07 NOTE — Progress Notes (Signed)
Pulse: 81

## 2012-12-07 NOTE — Patient Instructions (Signed)
Glucose Tolerance Test This is a test to see how your body processes carbohydrates. This test is often done to check patients for diabetes or the possibility of developing it. PREPARATION FOR TEST You should have nothing to eat or drink 12 hours before the test. You will be given a form of sugar (glucose) and then blood samples will be drawn from your vein to determine the level of sugar in your blood. Alternatively, blood may be drawn from your finger for testing. You should not smoke or exercise during the test. NORMAL FINDINGS  Fasting: 70-115 mg/dL  30 minutes: less than 200 mg/dL  1 hour: less than 200 mg/dL  2 hours: less than 140 mg/dL  3 hours: 70-115 mg/dL  4 hours: 70-115 mg/dL Ranges for normal findings may vary among different laboratories and hospitals. You should always check with your doctor after having lab work or other tests done to discuss the meaning of your test results and whether your values are considered within normal limits. MEANING OF TEST Your caregiver will go over the test results with you and discuss the importance and meaning of your results, as well as treatment options and the need for additional tests. OBTAINING THE TEST RESULTS It is your responsibility to obtain your test results. Ask the lab or department performing the test when and how you will get your results. Document Released: 09/16/2004 Document Revised: 11/16/2011 Document Reviewed: 08/04/2008 [employer] Patient Information 2013 [employer].  

## 2012-12-07 NOTE — Progress Notes (Signed)
B/Ps low.  D/C antihypertensive meds for now.  Check B/Ps at home.

## 2012-12-13 ENCOUNTER — Telehealth: Payer: Self-pay | Admitting: *Deleted

## 2012-12-13 NOTE — Telephone Encounter (Signed)
Patient called requesting a refill of her Fioricet- pt states she missed her appointment with the neurologist because they did not do a reminder call- she has called today and left word with them that she wants to reschedule. She has not heard back yet. Patient states she needs something for her headache.

## 2012-12-13 NOTE — Telephone Encounter (Signed)
Pt called regarding her headaches.  She is going to the beach this weekend and would like a new script for Fioricet.  Advised patient no per Dr. Tamela Oddi.  Pt must see a neurologist as previously advised.

## 2012-12-13 NOTE — Telephone Encounter (Signed)
Per Dr Clearance Coots- he can not refill at this time. Patient to call office tomorrow if she has not heard from neurology office.

## 2012-12-19 ENCOUNTER — Telehealth: Payer: Self-pay | Admitting: *Deleted

## 2012-12-19 NOTE — Telephone Encounter (Signed)
Pt is requesting a phone call from Dr. Tamela Oddi. Pt states she was told she would have another ultrasound after 30 weeks. Pt is requesting to have another ultrasound before 30 weeks.

## 2012-12-21 ENCOUNTER — Telehealth: Payer: Self-pay | Admitting: *Deleted

## 2012-12-21 NOTE — Telephone Encounter (Signed)
Pt states she has been calling and would like a return call.

## 2012-12-21 NOTE — Telephone Encounter (Signed)
Pt states she was informed by Dr. Tamela Oddi that she would not receive another ultrasound until after 30 weeks. Pt states she would like to have another ultrasound before 30 weeks because " that is just to long to wait". Pt states she has had 2-3 braxton hicks a day. Encouraged patient to increase her fluids, rest and try a warm bath. Pt to call back for an appointment if they are to get worse. Patient states she has not had any braxton hicks at this point today.

## 2012-12-28 ENCOUNTER — Encounter (HOSPITAL_COMMUNITY): Payer: Self-pay

## 2012-12-28 ENCOUNTER — Inpatient Hospital Stay (HOSPITAL_COMMUNITY)
Admission: AD | Admit: 2012-12-28 | Discharge: 2012-12-28 | Disposition: A | Discharge status: Home / Self Care | Payer: Medicaid Other | Source: Ambulatory Visit | Attending: Obstetrics & Gynecology | Admitting: Obstetrics & Gynecology

## 2012-12-28 DIAGNOSIS — K529 Noninfective gastroenteritis and colitis, unspecified: Secondary | ICD-10-CM

## 2012-12-28 DIAGNOSIS — O212 Late vomiting of pregnancy: Secondary | ICD-10-CM | POA: Insufficient documentation

## 2012-12-28 DIAGNOSIS — R109 Unspecified abdominal pain: Secondary | ICD-10-CM | POA: Insufficient documentation

## 2012-12-28 DIAGNOSIS — K5289 Other specified noninfective gastroenteritis and colitis: Secondary | ICD-10-CM

## 2012-12-28 DIAGNOSIS — A088 Other specified intestinal infections: Secondary | ICD-10-CM | POA: Insufficient documentation

## 2012-12-28 DIAGNOSIS — O99891 Other specified diseases and conditions complicating pregnancy: Secondary | ICD-10-CM | POA: Insufficient documentation

## 2012-12-28 LAB — URINALYSIS, ROUTINE W REFLEX MICROSCOPIC
Bilirubin Urine: NEGATIVE
Leukocytes, UA: NEGATIVE
Nitrite: NEGATIVE
Specific Gravity, Urine: 1.02 (ref 1.005–1.030)
pH: 6 (ref 5.0–8.0)

## 2012-12-28 MED ORDER — LACTATED RINGERS IV BOLUS (SEPSIS)
1000.0000 mL | Freq: Once | INTRAVENOUS | Status: AC
  Administered 2012-12-28: 1000 mL via INTRAVENOUS

## 2012-12-28 MED ORDER — ONDANSETRON HCL 4 MG/2ML IJ SOLN
4.0000 mg | Freq: Once | INTRAMUSCULAR | Status: AC
  Administered 2012-12-28: 4 mg via INTRAVENOUS
  Filled 2012-12-28: qty 2

## 2012-12-28 MED ORDER — ONDANSETRON HCL 4 MG PO TABS: 4.0000 mg | ORAL_TABLET | Freq: Four times a day (QID) | ORAL | Status: DC

## 2012-12-28 NOTE — MAU Note (Signed)
Patient is in with new onset of upper and lower abdominal pain, n/v. Patient states that she have not been able to keep anything down. She attempted a dose of promethazine but vomitied thereafter. Denies vaginal bleeding or abnormal vaginal discharge. Unsure if good fetal movement per patient because she have been sleeping almost all day. fht 153-156. Cath urine obtained. Patient states that she could not void.

## 2012-12-28 NOTE — MAU Provider Note (Signed)
History     CSN: [id number]  Arrival date and time: 12/28/12 1417   First Provider Initiated Contact with Patient 12/28/12 1454      Chief Complaint  Patient presents with  . Emesis  . Abdominal Pain   HPI [name] is a 32 y.o. [phone] at [redacted]w[redacted]d who presents to MAU today with complaint of N/V since this morning. The patient attempted to take Phenergan, but vomited shortly after. She denies fever, sick contacts, vaginal bleeding or discharge. She has had mild diffuse abdominal pain since this morning. She reports good fetal movement.   OB History   Grav Para Term Preterm Abortions TAB SAB Ect Mult Living   4 2 2  0 1   1  2       Past Medical History  Diagnosis Date  . Acid reflux   . Menstrual bleeding problem   . Headache   . Ectopic pregnancy     Had MTX  . Asthma May 2013    info from [facility] [place] states asthma but pt denies and said she had bronchitis, used an Hydrologist while sick and has now lost the inhaler.  . Hypertension     Past Surgical History  Procedure Laterality Date  . Cesarean section    . Dilation and curettage of uterus    . Laparoscopy    . Cesarean section  03/28/2012    Procedure: CESAREAN SECTION;  Surgeon: Antionette Char, MD;  Location: [facility];  Service: Gynecology;  Laterality: N/A;    Family History  Problem Relation Age of Onset  . Anesthesia problems Neg Hx   . Other Neg Hx   . Hypertension Mother   . Diabetes Mother   . Diabetes Father   . Hypertension Brother     History  Substance Use Topics  . Smoking status: Smoker, Current Status Unknown -- 0.50 packs/day    Types: Cigarettes  . Smokeless tobacco: Never Used  . Alcohol Use: No    Allergies:  Allergies  Allergen Reactions  . Aspirin Shortness Of Breath  . Dilaudid (Hydromorphone Hcl) Other (See Comments)    Pt says medication causes her to shake uncontrollably.  . Ibuprofen     Gi upset    Prescriptions prior to admission  Medication Sig Dispense Refill  .  acetaminophen (TYLENOL) 500 MG tablet Take 1,000 mg by mouth every 6 (six) hours as needed. For pain      . acetaminophen-codeine (TYLENOL #3) 300-30 MG per tablet Take 1 tablet by mouth every 4 (four) hours as needed for pain.  10 tablet  0  . amLODipine (NORVASC) 5 MG tablet Take 1 tablet (5 mg total) by mouth daily.  30 tablet  11  . butalbital-acetaminophen-caffeine (FIORICET) 50-325-40 MG per tablet Take 2 tablets by mouth every 6 (six) hours as needed for headache.  40 tablet  2  . calcium carbonate (TUMS - DOSED IN MG ELEMENTAL CALCIUM) 500 MG chewable tablet Chew 1 tablet by mouth daily. heartburn      . dextromethorphan (DELSYM) 30 MG/5ML liquid Take 10 mLs (60 mg total) by mouth as needed for cough.  89 mL  0  . esomeprazole (NEXIUM) 40 MG capsule Take 40 mg by mouth daily before breakfast.       . guaiFENesin-dextromethorphan (ROBITUSSIN DM) 100-10 MG/5ML syrup Take 5 mLs by mouth 3 (three) times daily as needed for cough.  118 mL  0  . labetalol (NORMODYNE) 200 MG tablet Take 200 mg by mouth  daily.      . nebivolol (BYSTOLIC) 10 MG tablet Take 1 tablet (10 mg total) by mouth daily.  30 tablet  11  . oseltamivir (TAMIFLU) 75 MG capsule Take 1 capsule (75 mg total) by mouth 1 day or 1 dose.  7 capsule  0  . oseltamivir (TAMIFLU) 75 MG capsule Take 1 capsule (75 mg total) by mouth daily.  7 capsule  0  . Oxycodone HCl 10 MG TABS Take 1 tablet (10 mg total) by mouth every 6 (six) hours as needed.  40 tablet  0  . Prenatal Vit-Fe Fumarate-FA (PRENATAL MULTIVITAMIN) TABS Take 1 tablet by mouth daily.        Review of Systems  Constitutional: Positive for malaise/fatigue. Negative for fever and chills.  Gastrointestinal: Positive for nausea, vomiting and abdominal pain. Negative for diarrhea and constipation.  Genitourinary: Negative for dysuria, urgency and frequency.       Neg - vaginal discharge, bleeding   Physical Exam   Blood pressure 138/65, pulse 89, temperature 98.3 F (36.8  C), temperature source Oral, resp. rate 18, last menstrual period 07/13/2012.  Physical Exam  Constitutional: She is oriented to person, place, and time. She appears well-developed and well-nourished. No distress.  HENT:  Head: Normocephalic and atraumatic.  Cardiovascular: Normal rate, regular rhythm and normal heart sounds.   Respiratory: Effort normal and breath sounds normal. No respiratory distress.  GI: Soft. Bowel sounds are normal. She exhibits no distension and no mass. There is tenderness (mild diffuse tenderness to palpation). There is no rebound and no guarding.  Neurological: She is alert and oriented to person, place, and time.  Skin: Skin is warm and dry. No erythema.  Psychiatric: She has a normal mood and affect.   Results for orders placed during the hospital encounter of 12/28/12 (from the past 24 hour(s))  URINALYSIS, ROUTINE W REFLEX MICROSCOPIC     Status: Abnormal   Collection Time    12/28/12  2:31 PM      Result Value Range   Color, Urine YELLOW  YELLOW   APPearance CLEAR  CLEAR   Specific Gravity, Urine 1.020  1.005 - 1.030   pH 6.0  5.0 - 8.0   Glucose, UA NEGATIVE  NEGATIVE mg/dL   Hgb urine dipstick NEGATIVE  NEGATIVE   Bilirubin Urine NEGATIVE  NEGATIVE   Ketones, ur 15 (*) NEGATIVE mg/dL   Protein, ur NEGATIVE  NEGATIVE mg/dL   Urobilinogen, UA 0.2  0.0 - 1.0 mg/dL   Nitrite NEGATIVE  NEGATIVE   Leukocytes, UA NEGATIVE  NEGATIVE     MAU Course  Procedures None  MDM UA shows some signs of dehydration Acute onset, most likely viral gastroenteritis 1 L LR with zofran today - patient improved and resting comfortably +FHTs   Assessment and Plan  A: Viral Gastroenteritis  P: Discharge home Rx for Zofran sent to patient's pharmacy. May also continue taking phenergan as previously prescribed Follow-up with [facility] as scheduled Patient may return to MAU as needed or if her condition were to change or worsen  Freddi Starr, PA-C  12/28/2012,  2:54 PM

## 2012-12-28 NOTE — MAU Note (Signed)
Patient states she has had nausea and vomiting with upper abdominal pain since this am. States she has been unable to keep anything down and is unable to get a urine specimen at this time.

## 2013-01-04 ENCOUNTER — Encounter: Payer: Medicaid Other | Admitting: Obstetrics & Gynecology

## 2013-01-04 ENCOUNTER — Other Ambulatory Visit: Payer: Medicaid Other

## 2013-01-05 ENCOUNTER — Encounter: Payer: Self-pay | Admitting: Obstetrics & Gynecology

## 2013-01-11 ENCOUNTER — Other Ambulatory Visit: Payer: Medicaid Other | Admitting: *Deleted

## 2013-01-11 ENCOUNTER — Ambulatory Visit (INDEPENDENT_AMBULATORY_CARE_PROVIDER_SITE_OTHER): Payer: Medicaid Other | Admitting: Obstetrics & Gynecology

## 2013-01-11 ENCOUNTER — Encounter: Payer: Self-pay | Admitting: Obstetrics & Gynecology

## 2013-01-11 VITALS — BP 135/90 | Temp 96.7°F | Wt 335.0 lb

## 2013-01-11 DIAGNOSIS — Z3480 Encounter for supervision of other normal pregnancy, unspecified trimester: Secondary | ICD-10-CM

## 2013-01-11 DIAGNOSIS — Z3482 Encounter for supervision of other normal pregnancy, second trimester: Secondary | ICD-10-CM

## 2013-01-11 LAB — CBC
Hemoglobin: 10.7 g/dL — ABNORMAL LOW (ref 12.0–15.0)
MCHC: 33.9 g/dL (ref 30.0–36.0)
RDW: 14.1 % (ref 11.5–15.5)

## 2013-01-11 LAB — POCT URINALYSIS DIPSTICK
Blood, UA: NEGATIVE
Nitrite, UA: NEGATIVE
Urobilinogen, UA: NEGATIVE
pH, UA: 6

## 2013-01-11 NOTE — Progress Notes (Signed)
Doing well 

## 2013-01-11 NOTE — Progress Notes (Signed)
Pulse-83  Pt c/o intermittent upper middle abdominal pain x 2 weeks. Pt c/o headaches x 2 days.

## 2013-01-11 NOTE — Patient Instructions (Signed)
Pregnancy - Second Trimester The second trimester of pregnancy (3 to 6 months) is a period of rapid growth for you and your baby. At the end of the sixth month, your baby is about 9 inches long and weighs 1 1/2 pounds. You will begin to feel the baby move between 18 and 20 weeks of the pregnancy. This is called quickening. Weight gain is faster. A clear fluid (colostrum) may leak out of your breasts. You may feel small contractions of the womb (uterus). This is known as false labor or Braxton-Hicks contractions. This is like a practice for labor when the baby is ready to be born. Usually, the problems with morning sickness have usually passed by the end of your first trimester. Some women develop small dark blotches (called cholasma, mask of pregnancy) on their face that usually goes away after the baby is born. Exposure to the sun makes the blotches worse. Acne may also develop in some pregnant women and pregnant women who have acne, may find that it goes away. PRENATAL EXAMS  Blood work may continue to be done during prenatal exams. These tests are done to check on your health and the probable health of your baby. Blood work is used to follow your blood levels (hemoglobin). Anemia (low hemoglobin) is common during pregnancy. Iron and vitamins are given to help prevent this. You will also be checked for diabetes between 24 and 28 weeks of the pregnancy. Some of the previous blood tests may be repeated.  The size of the uterus is measured during each visit. This is to make sure that the baby is continuing to grow properly according to the dates of the pregnancy.  Your blood pressure is checked every prenatal visit. This is to make sure you are not getting toxemia.  Your urine is checked to make sure you do not have an infection, diabetes or protein in the urine.  Your weight is checked often to make sure gains are happening at the suggested rate. This is to ensure that both you and your baby are growing  normally.  Sometimes, an ultrasound is performed to confirm the proper growth and development of the baby. This is a test which bounces harmless sound waves off the baby so your caregiver can more accurately determine due dates. Sometimes, a specialized test is done on the amniotic fluid surrounding the baby. This test is called an amniocentesis. The amniotic fluid is obtained by sticking a needle into the belly (abdomen). This is done to check the chromosomes in instances where there is a concern about possible genetic problems with the baby. It is also sometimes done near the end of pregnancy if an early delivery is required. In this case, it is done to help make sure the baby's lungs are mature enough for the baby to live outside of the womb. CHANGES OCCURING IN THE SECOND TRIMESTER OF PREGNANCY Your body goes through many changes during pregnancy. They vary from person to person. Talk to your caregiver about changes you notice that you are concerned about.  During the second trimester, you will likely have an increase in your appetite. It is normal to have cravings for certain foods. This varies from person to person and pregnancy to pregnancy.  Your lower abdomen will begin to bulge.  You may have to urinate more often because the uterus and baby are pressing on your bladder. It is also common to get more bladder infections during pregnancy (pain with urination). You can help this by   drinking lots of fluids and emptying your bladder before and after intercourse.  You may begin to get stretch marks on your hips, abdomen, and breasts. These are normal changes in the body during pregnancy. There are no exercises or medications to take that prevent this change.  You may begin to develop swollen and bulging veins (varicose veins) in your legs. Wearing support hose, elevating your feet for 15 minutes, 3 to 4 times a day and limiting salt in your diet helps lessen the problem.  Heartburn may develop  as the uterus grows and pushes up against the stomach. Antacids recommended by your caregiver helps with this problem. Also, eating smaller meals 4 to 5 times a day helps.  Constipation can be treated with a stool softener or adding bulk to your diet. Drinking lots of fluids, vegetables, fruits, and whole grains are helpful.  Exercising is also helpful. If you have been very active up until your pregnancy, most of these activities can be continued during your pregnancy. If you have been less active, it is helpful to start an exercise program such as walking.  Hemorrhoids (varicose veins in the rectum) may develop at the end of the second trimester. Warm sitz baths and hemorrhoid cream recommended by your caregiver helps hemorrhoid problems.  Backaches may develop during this time of your pregnancy. Avoid heavy lifting, wear low heal shoes and practice good posture to help with backache problems.  Some pregnant women develop tingling and numbness of their hand and fingers because of swelling and tightening of ligaments in the wrist (carpel tunnel syndrome). This goes away after the baby is born.  As your breasts enlarge, you may have to get a bigger bra. Get a comfortable, cotton, support bra. Do not get a nursing bra until the last month of the pregnancy if you will be nursing the baby.  You may get a dark line from your belly button to the pubic area called the linea nigra.  You may develop rosy cheeks because of increase blood flow to the face.  You may develop spider looking lines of the face, neck, arms and chest. These go away after the baby is born. HOME CARE INSTRUCTIONS   It is extremely important to avoid all smoking, herbs, alcohol, and unprescribed drugs during your pregnancy. These chemicals affect the formation and growth of the baby. Avoid these chemicals throughout the pregnancy to ensure the delivery of a healthy infant.  Most of your home care instructions are the same as  suggested for the first trimester of your pregnancy. Keep your caregiver's appointments. Follow your caregiver's instructions regarding medication use, exercise and diet.  During pregnancy, you are providing food for you and your baby. Continue to eat regular, well-balanced meals. Choose foods such as meat, fish, milk and other low fat dairy products, vegetables, fruits, and whole-grain breads and cereals. Your caregiver will tell you of the ideal weight gain.  A physical sexual relationship may be continued up until near the end of pregnancy if there are no other problems. Problems could include early (premature) leaking of amniotic fluid from the membranes, vaginal bleeding, abdominal pain, or other medical or pregnancy problems.  Exercise regularly if there are no restrictions. Check with your caregiver if you are unsure of the safety of some of your exercises. The greatest weight gain will occur in the last 2 trimesters of pregnancy. Exercise will help you:  Control your weight.  Get you in shape for labor and delivery.  Lose weight   after you have the baby.  Wear a good support or jogging bra for breast tenderness during pregnancy. This may help if worn during sleep. Pads or tissues may be used in the bra if you are leaking colostrum.  Do not use hot tubs, steam rooms or saunas throughout the pregnancy.  Wear your seat belt at all times when driving. This protects you and your baby if you are in an accident.  Avoid raw meat, uncooked cheese, cat litter boxes and soil used by cats. These carry germs that can cause birth defects in the baby.  The second trimester is also a good time to visit your dentist for your dental health if this has not been done yet. Getting your teeth cleaned is OK. Use a soft toothbrush. Brush gently during pregnancy.  It is easier to loose urine during pregnancy. Tightening up and strengthening the pelvic muscles will help with this problem. Practice stopping your  urination while you are going to the bathroom. These are the same muscles you need to strengthen. It is also the muscles you would use as if you were trying to stop from passing gas. You can practice tightening these muscles up 10 times a set and repeating this about 3 times per day. Once you know what muscles to tighten up, do not perform these exercises during urination. It is more likely to contribute to an infection by backing up the urine.  Ask for help if you have financial, counseling or nutritional needs during pregnancy. Your caregiver will be able to offer counseling for these needs as well as refer you for other special needs.  Your skin may become oily. If so, wash your face with mild soap, use non-greasy moisturizer and oil or cream based makeup. MEDICATIONS AND DRUG USE IN PREGNANCY  Take prenatal vitamins as directed. The vitamin should contain 1 milligram of folic acid. Keep all vitamins out of reach of children. Only a couple vitamins or tablets containing iron may be fatal to a baby or young child when ingested.  Avoid use of all medications, including herbs, over-the-counter medications, not prescribed or suggested by your caregiver. Only take over-the-counter or prescription medicines for pain, discomfort, or fever as directed by your caregiver. Do not use aspirin.  Let your caregiver also know about herbs you may be using.  Alcohol is related to a number of birth defects. This includes fetal alcohol syndrome. All alcohol, in any form, should be avoided completely. Smoking will cause low birth rate and premature babies.  Street or illegal drugs are very harmful to the baby. They are absolutely forbidden. A baby born to an addicted mother will be addicted at birth. The baby will go through the same withdrawal an adult does. SEEK MEDICAL CARE IF:  You have any concerns or worries during your pregnancy. It is better to call with your questions if you feel they cannot wait, rather  than worry about them. SEEK IMMEDIATE MEDICAL CARE IF:   An unexplained oral temperature above 102 F (38.9 C) develops, or as your caregiver suggests.  You have leaking of fluid from the vagina (birth canal). If leaking membranes are suspected, take your temperature and tell your caregiver of this when you call.  There is vaginal spotting, bleeding, or passing clots. Tell your caregiver of the amount and how many pads are used. Light spotting in pregnancy is common, especially following intercourse.  You develop a bad smelling vaginal discharge with a change in the color from clear   to white.  You continue to feel sick to your stomach (nauseated) and have no relief from remedies suggested. You vomit blood or coffee ground-like materials.  You lose more than 2 pounds of weight or gain more than 2 pounds of weight over 1 week, or as suggested by your caregiver.  You notice swelling of your face, hands, feet, or legs.  You get exposed to German measles and have never had them.  You are exposed to fifth disease or chickenpox.  You develop belly (abdominal) pain. Round ligament discomfort is a common non-cancerous (benign) cause of abdominal pain in pregnancy. Your caregiver still must evaluate you.  You develop a bad headache that does not go away.  You develop fever, diarrhea, pain with urination, or shortness of breath.  You develop visual problems, blurry, or double vision.  You fall or are in a car accident or any kind of trauma.  There is mental or physical violence at home. Document Released: 08/18/2001 Document Revised: 11/16/2011 Document Reviewed: 02/20/2009 [employer] Patient Information 2013 [employer].  

## 2013-01-12 LAB — RPR

## 2013-01-12 LAB — HIV ANTIBODY (ROUTINE TESTING W REFLEX): HIV: NONREACTIVE

## 2013-01-12 LAB — GLUCOSE TOLERANCE, 2 HOURS W/ 1HR
Glucose, 1 hour: 143 mg/dL (ref 70–170)
Glucose, 2 hour: 107 mg/dL (ref 70–139)

## 2013-01-17 ENCOUNTER — Other Ambulatory Visit: Payer: Self-pay | Admitting: Obstetrics & Gynecology

## 2013-01-17 DIAGNOSIS — O365921 Maternal care for other known or suspected poor fetal growth, second trimester, fetus 1: Secondary | ICD-10-CM

## 2013-02-01 ENCOUNTER — Encounter: Payer: Self-pay | Admitting: Obstetrics

## 2013-02-01 ENCOUNTER — Ambulatory Visit (INDEPENDENT_AMBULATORY_CARE_PROVIDER_SITE_OTHER): Payer: Medicaid Other

## 2013-02-01 DIAGNOSIS — O0990 Supervision of high risk pregnancy, unspecified, unspecified trimester: Secondary | ICD-10-CM

## 2013-02-01 DIAGNOSIS — O365921 Maternal care for other known or suspected poor fetal growth, second trimester, fetus 1: Secondary | ICD-10-CM

## 2013-02-01 LAB — US OB DETAIL + 14 WK

## 2013-02-02 ENCOUNTER — Encounter: Payer: Self-pay | Admitting: Obstetrics & Gynecology

## 2013-02-02 ENCOUNTER — Encounter: Payer: Self-pay | Admitting: Obstetrics

## 2013-02-02 LAB — US OB DETAIL + 14 WK

## 2013-02-07 ENCOUNTER — Encounter: Payer: Self-pay | Admitting: Obstetrics & Gynecology

## 2013-02-07 LAB — US OB DETAIL + 14 WK

## 2013-02-08 ENCOUNTER — Ambulatory Visit (INDEPENDENT_AMBULATORY_CARE_PROVIDER_SITE_OTHER): Payer: Medicaid Other | Admitting: Obstetrics & Gynecology

## 2013-02-08 ENCOUNTER — Encounter: Payer: Medicaid Other | Admitting: Obstetrics & Gynecology

## 2013-02-08 VITALS — BP 143/80 | Temp 98.2°F | Wt 336.0 lb

## 2013-02-08 DIAGNOSIS — Z3482 Encounter for supervision of other normal pregnancy, second trimester: Secondary | ICD-10-CM

## 2013-02-08 DIAGNOSIS — Z3480 Encounter for supervision of other normal pregnancy, unspecified trimester: Secondary | ICD-10-CM

## 2013-02-08 LAB — POCT URINALYSIS DIPSTICK
Bilirubin, UA: NEGATIVE
Blood, UA: NEGATIVE
Glucose, UA: NEGATIVE
Ketones, UA: NEGATIVE
pH, UA: 6

## 2013-02-08 NOTE — Progress Notes (Signed)
P 85 Patient reports she is having some cramping at times, but she describes it as not bad.

## 2013-02-08 NOTE — Patient Instructions (Addendum)
Contraception Choices Contraception (birth control) is the use of any methods or devices to prevent pregnancy. Below are some methods to help avoid pregnancy. HORMONAL METHODS   Contraceptive implant. This is a thin, plastic tube containing progesterone hormone. It does not contain estrogen hormone. Your caregiver inserts the tube in the inner part of the upper arm. The tube can remain in place for up to 3 years. After 3 years, the implant must be removed. The implant prevents the ovaries from releasing an egg (ovulation), thickens the cervical mucus which prevents sperm from entering the uterus, and thins the lining of the inside of the uterus.  Progesterone-only injections. These injections are given every 3 months by your caregiver to prevent pregnancy. This synthetic progesterone hormone stops the ovaries from releasing eggs. It also thickens cervical mucus and changes the uterine lining. This makes it harder for sperm to survive in the uterus.  Birth control pills. These pills contain estrogen and progesterone hormone. They work by stopping the egg from forming in the ovary (ovulation). Birth control pills are prescribed by a caregiver.Birth control pills can also be used to treat heavy periods.  Minipill. This type of birth control pill contains only the progesterone hormone. They are taken every day of each month and must be prescribed by your caregiver.  Birth control patch. The patch contains hormones similar to those in birth control pills. It must be changed once a week and is prescribed by a caregiver.  Vaginal ring. The ring contains hormones similar to those in birth control pills. It is left in the vagina for 3 weeks, removed for 1 week, and then a new one is put back in place. The patient must be comfortable inserting and removing the ring from the vagina.A caregiver's prescription is necessary.  Emergency contraception. Emergency contraceptives prevent pregnancy after unprotected  sexual intercourse. This pill can be taken right after sex or up to 5 days after unprotected sex. It is most effective the sooner you take the pills after having sexual intercourse. Emergency contraceptive pills are available without a prescription. Check with your pharmacist. Do not use emergency contraception as your only form of birth control. BARRIER METHODS   Female condom. This is a thin sheath (latex or rubber) that is worn over the penis during sexual intercourse. It can be used with spermicide to increase effectiveness.  Female condom. This is a soft, loose-fitting sheath that is put into the vagina before sexual intercourse.  Diaphragm. This is a soft, latex, dome-shaped barrier that must be fitted by a caregiver. It is inserted into the vagina, along with a spermicidal jelly. It is inserted before intercourse. The diaphragm should be left in the vagina for 6 to 8 hours after intercourse.  Cervical cap. This is a round, soft, latex or plastic cup that fits over the cervix and must be fitted by a caregiver. The cap can be left in place for up to 48 hours after intercourse.  Sponge. This is a soft, circular piece of polyurethane foam. The sponge has spermicide in it. It is inserted into the vagina after wetting it and before sexual intercourse.  Spermicides. These are chemicals that kill or block sperm from entering the cervix and uterus. They come in the form of creams, jellies, suppositories, foam, or tablets. They do not require a prescription. They are inserted into the vagina with an applicator before having sexual intercourse. The process must be repeated every time you have sexual intercourse. INTRAUTERINE CONTRACEPTION  Intrauterine device (  IUD). This is a T-shaped device that is put in a woman's uterus during a menstrual period to prevent pregnancy. There are 2 types:  Copper IUD. This type of IUD is wrapped in copper wire and is placed inside the uterus. Copper makes the uterus and  fallopian tubes produce a fluid that kills sperm. It can stay in place for 10 years.  Hormone IUD. This type of IUD contains the hormone progestin (synthetic progesterone). The hormone thickens the cervical mucus and prevents sperm from entering the uterus, and it also thins the uterine lining to prevent implantation of a fertilized egg. The hormone can weaken or kill the sperm that get into the uterus. It can stay in place for 5 years. PERMANENT METHODS OF CONTRACEPTION  Female tubal ligation. This is when the woman's fallopian tubes are surgically sealed, tied, or blocked to prevent the egg from traveling to the uterus.  Female sterilization. This is when the female has the tubes that carry sperm tied off (vasectomy).This blocks sperm from entering the vagina during sexual intercourse. After the procedure, the man can still ejaculate fluid (semen). NATURAL PLANNING METHODS  Natural family planning. This is not having sexual intercourse or using a barrier method (condom, diaphragm, cervical cap) on days the woman could become pregnant.  Calendar method. This is keeping track of the length of each menstrual cycle and identifying when you are fertile.  Ovulation method. This is avoiding sexual intercourse during ovulation.  Symptothermal method. This is avoiding sexual intercourse during ovulation, using a thermometer and ovulation symptoms.  Post-ovulation method. This is timing sexual intercourse after you have ovulated. Regardless of which type or method of contraception you choose, it is important that you use condoms to protect against the transmission of sexually transmitted diseases (STDs). Talk with your caregiver about which form of contraception is most appropriate for you. Document Released: 08/24/2005 Document Revised: 11/16/2011 Document Reviewed: 12/31/2010 [employer] Patient Information 2014 [employer]. Tetanus, Diphtheria, Pertussis (Tdap) Vaccine What You Need to Know WHY GET  VACCINATED? Tetanus, diphtheria and pertussis can be very serious diseases, even for adolescents and adults. Tdap vaccine can protect us from these diseases. TETANUS (Lockjaw) causes painful muscle tightening and stiffness, usually all over the body.  It can lead to tightening of muscles in the head and neck so you can't open your mouth, swallow, or sometimes even breathe. Tetanus kills about 1 out of 5 people who are infected. DIPHTHERIA can cause a thick coating to form in the back of the throat.  It can lead to breathing problems, paralysis, heart failure, and death. PERTUSSIS (Whooping Cough) causes severe coughing spells, which can cause difficulty breathing, vomiting and disturbed sleep.  It can also lead to weight loss, incontinence, and rib fractures. Up to 2 in 100 adolescents and 5 in 100 adults with pertussis are hospitalized or have complications, which could include pneumonia and death. These diseases are caused by bacteria. Diphtheria and pertussis are spread from person to person through coughing or sneezing. Tetanus enters the body through cuts, scratches, or wounds. Before vaccines, the [place] saw as many as 200,000 cases a year of diphtheria and pertussis, and hundreds of cases of tetanus. Since vaccination began, tetanus and diphtheria have dropped by about 99% and pertussis by about 80%. TDAP VACCINE Tdap vaccine can protect adolescents and adults from tetanus, diphtheria, and pertussis. One dose of Tdap is routinely given at age 11 or 12. People who did not get Tdap at that age should get   it as soon as possible. Tdap is especially important for health care professionals and anyone having close contact with a baby younger than 12 months. Pregnant women should get a dose of Tdap during every pregnancy, to protect the newborn from pertussis. Infants are most at risk for severe, life-threatening complications from pertussis. A similar vaccine, called Td, protects from tetanus  and diphtheria, but not pertussis. A Td booster should be given every 10 years. Tdap may be given as one of these boosters if you have not already gotten a dose. Tdap may also be given after a severe cut or burn to prevent tetanus infection. Your doctor can give you more information. Tdap may safely be given at the same time as other vaccines. SOME PEOPLE SHOULD NOT GET THIS VACCINE  If you ever had a life-threatening allergic reaction after a dose of any tetanus, diphtheria, or pertussis containing vaccine, OR if you have a severe allergy to any part of this vaccine, you should not get Tdap. Tell your doctor if you have any severe allergies.  If you had a coma, or long or multiple seizures within 7 days after a childhood dose of DTP or DTaP, you should not get Tdap, unless a cause other than the vaccine was found. You can still get Td.  Talk to your doctor if you:  have epilepsy or another nervous system problem,  had severe pain or swelling after any vaccine containing diphtheria, tetanus or pertussis,  ever had Guillain-Barr Syndrome (GBS),  aren't feeling well on the day the shot is scheduled. RISKS OF A VACCINE REACTION With any medicine, including vaccines, there is a chance of side effects. These are usually mild and go away on their own, but serious reactions are also possible. Brief fainting spells can follow a vaccination, leading to injuries from falling. Sitting or lying down for about 15 minutes can help prevent these. Tell your doctor if you feel dizzy or light-headed, or have vision changes or ringing in the ears. Mild problems following Tdap (Did not interfere with activities)  Pain where the shot was given (about 3 in 4 adolescents or 2 in 3 adults)  Redness or swelling where the shot was given (about 1 person in 5)  Mild fever of at least 100.4F (up to about 1 in 25 adolescents or 1 in 100 adults)  Headache (about 3 or 4 people in 10)  Tiredness (about 1 person in 3  or 4)  Nausea, vomiting, diarrhea, stomach ache (up to 1 in 4 adolescents or 1 in 10 adults)  Chills, body aches, sore joints, rash, swollen glands (uncommon) Moderate problems following Tdap (Interfered with activities, but did not require medical attention)  Pain where the shot was given (about 1 in 5 adolescents or 1 in 100 adults)  Redness or swelling where the shot was given (up to about 1 in 16 adolescents or 1 in 25 adults)  Fever over 102F (about 1 in 100 adolescents or 1 in 250 adults)  Headache (about 3 in 20 adolescents or 1 in 10 adults)  Nausea, vomiting, diarrhea, stomach ache (up to 1 or 3 people in 100)  Swelling of the entire arm where the shot was given (up to about 3 in 100). Severe problems following Tdap (Unable to perform usual activities, required medical attention)  Swelling, severe pain, bleeding and redness in the arm where the shot was given (rare). A severe allergic reaction could occur after any vaccine (estimated less than 1 in a million   doses). WHAT IF THERE IS A SERIOUS REACTION? What should I look for?  Look for anything that concerns you, such as signs of a severe allergic reaction, very high fever, or behavior changes. Signs of a severe allergic reaction can include hives, swelling of the face and throat, difficulty breathing, a fast heartbeat, dizziness, and weakness. These would start a few minutes to a few hours after the vaccination. What should I do?  If you think it is a severe allergic reaction or other emergency that can't wait, call 9-1-1 or get the person to the nearest hospital. Otherwise, call your doctor.  Afterward, the reaction should be reported to the "Vaccine Adverse Event Reporting System" (VAERS). Your doctor might file this report, or you can do it yourself through the VAERS web site at www.vaers.hhs.gov, or by calling 1-800-822-7967. VAERS is only for reporting reactions. They do not give medical advice.  THE NATIONAL VACCINE  INJURY COMPENSATION PROGRAM The National Vaccine Injury Compensation Program (VICP) is a federal program that was created to compensate people who may have been injured by certain vaccines. Persons who believe they may have been injured by a vaccine can learn about the program and about filing a claim by calling 1-800-338-2382 or visiting the VICP website at www.hrsa.gov/vaccinecompensation. HOW CAN I LEARN MORE?  Ask your doctor.  Call your local or state health department.  Contact the Centers for Disease Control and Prevention (CDC):  Call 1-800-232-4636 or visit CDC's website at www.cdc.gov/vaccines. CDC Tdap Vaccine VIS (01/14/12) Document Released: 02/23/2012 Document Revised: 05/18/2012 Document Reviewed: 02/23/2012 [employer] Patient Information 2014 [employer].  

## 2013-02-10 ENCOUNTER — Encounter: Payer: Self-pay | Admitting: Obstetrics & Gynecology

## 2013-02-10 NOTE — Progress Notes (Signed)
BP OK. 

## 2013-02-15 ENCOUNTER — Ambulatory Visit (INDEPENDENT_AMBULATORY_CARE_PROVIDER_SITE_OTHER): Payer: Medicaid Other | Admitting: Obstetrics & Gynecology

## 2013-02-15 ENCOUNTER — Encounter: Payer: Self-pay | Admitting: Obstetrics & Gynecology

## 2013-02-15 ENCOUNTER — Other Ambulatory Visit: Payer: Self-pay | Admitting: Obstetrics & Gynecology

## 2013-02-15 VITALS — BP 125/77 | Temp 49.5°F | Wt 330.2 lb

## 2013-02-15 DIAGNOSIS — F17200 Nicotine dependence, unspecified, uncomplicated: Secondary | ICD-10-CM

## 2013-02-15 DIAGNOSIS — O365921 Maternal care for other known or suspected poor fetal growth, second trimester, fetus 1: Secondary | ICD-10-CM

## 2013-02-15 DIAGNOSIS — Z3480 Encounter for supervision of other normal pregnancy, unspecified trimester: Secondary | ICD-10-CM

## 2013-02-15 DIAGNOSIS — Z3482 Encounter for supervision of other normal pregnancy, second trimester: Secondary | ICD-10-CM

## 2013-02-15 DIAGNOSIS — Z720 Tobacco use: Secondary | ICD-10-CM | POA: Insufficient documentation

## 2013-02-15 LAB — POCT URINALYSIS DIPSTICK
Bilirubin, UA: NEGATIVE
Blood, UA: NEGATIVE
Glucose, UA: NEGATIVE
Nitrite, UA: NEGATIVE

## 2013-02-15 NOTE — Patient Instructions (Signed)
Pregnancy and Smoking Smoking during pregnancy is very unhealthy for the mother and the developing fetus. The addictive drug in cigarettes (nicotine), carbon monoxide, and many other poisons are inhaled from a cigarette and are carried through your bloodstream to your fetus. Cigarette smoke contains more than 2,500 chemicals. It is not known which of these chemicals are harmful to the developing fetus. However, both nicotine and carbon monoxide play a role in causing health problems in pregnancy. Effects on the fetus of smoking during pregnancy:  Decrease in blood flow and oxygen to the uterus, placenta, and your fetus.  Increased heart rate of the fetus.  Slowing of your fetus's growth in the uterus (intrauterine growth retardation).  Placental problems. Placenta may partially cover or completely cover the opening to the cervix (placenta previa), or the placenta may partially or completely separate from the uterus (placental abruption).  Increase risk of pregnancy outside of the uterus (tubal pregnancy).  Premature rupture membranes, causing the sac that holds the fetus to break too early, resulting in premature birth and increased health risks to the newborn.  Increased risk of birth defects, including heart defects.  Increased risk of miscarriage. Newborns born to women who smoke during pregnancy:  Are more likely to be born too early (prematurely).  Are more likely to be at a low birth weight.  Are at risk for serious health problems, chronic or lifelong disabilities (cerebral palsy, mental retardation, learning problems), and possibly even death  Are at risk of Sudden Infant Death Syndrome (SIDS).  Have higher rates of miscarriage and stillbirth.  Have more lung and breathing (respiratory) problems. Long-term effects on a child's behavior: Some of the following trends are seen with children of smoking mothers:  Increased risk for drug abuse, behavior, and conduct  disorders.  Increased risk for smoking in adolescent girls.  Increased risk for negative behavior in 2-year-olds.  Increase risk for asthma, colic, and childhood obesity, which can lead to diabetes.  Increased risk for finger and toe disorders. Resources to stop smoking during pregnancy:  Counseling.  Psychological treatment.  Acupuncture.  Family intervention.  Hypnosis.  Medicines that are safe to take during pregnancy. Nicotine supplements have not been studied enough. They should only be considered when all other methods fail.  Telephone QUIT lines. Smoking and Breastfeeding:  Nicotine gets passed to the infant through a mother's breastmilk. This can cause nausea, colic, cramping, and diarrhea in the infant.  Smoking may reduce milk supply and interfere with the let-down response.  Even formula-fed infants of mothers who smoke have nicotine and cotinine (nicotine by-product) in their urine. Other resources to help stop smoking:  American Cancer Society: www.cancer.org  American Heart Association: www.americanheart.org  National Cancer Institute: www.cancer.gov  Smoke Free Families: www.smokefreefamilies.[address] [place]): (671)371-3491 START Document Released: 01/05/2005 Document Revised: 11/16/2011 Document Reviewed: 06/05/2009 [facility] Patient Information 2014 [place], [place].

## 2013-02-15 NOTE — Progress Notes (Signed)
Pulse- 83 Pt states she is having pressure in lower abdomen.

## 2013-02-16 NOTE — Progress Notes (Signed)
Doing well 

## 2013-02-17 ENCOUNTER — Ambulatory Visit (HOSPITAL_COMMUNITY)
Admission: RE | Admit: 2013-02-17 | Discharge: 2013-02-17 | Disposition: A | Discharge status: Home / Self Care | Payer: Medicaid Other | Source: Ambulatory Visit | Attending: Obstetrics & Gynecology | Admitting: Obstetrics & Gynecology

## 2013-02-17 DIAGNOSIS — O10019 Pre-existing essential hypertension complicating pregnancy, unspecified trimester: Secondary | ICD-10-CM | POA: Insufficient documentation

## 2013-02-17 DIAGNOSIS — Z3689 Encounter for other specified antenatal screening: Secondary | ICD-10-CM | POA: Insufficient documentation

## 2013-02-17 DIAGNOSIS — O365921 Maternal care for other known or suspected poor fetal growth, second trimester, fetus 1: Secondary | ICD-10-CM

## 2013-02-17 DIAGNOSIS — E669 Obesity, unspecified: Secondary | ICD-10-CM | POA: Insufficient documentation

## 2013-02-17 DIAGNOSIS — O34219 Maternal care for unspecified type scar from previous cesarean delivery: Secondary | ICD-10-CM | POA: Insufficient documentation

## 2013-02-22 ENCOUNTER — Encounter: Payer: Self-pay | Admitting: Obstetrics & Gynecology

## 2013-02-22 ENCOUNTER — Ambulatory Visit (INDEPENDENT_AMBULATORY_CARE_PROVIDER_SITE_OTHER): Payer: Medicaid Other | Admitting: Obstetrics & Gynecology

## 2013-02-22 VITALS — BP 143/82 | Temp 99.1°F | Wt 328.0 lb

## 2013-02-22 DIAGNOSIS — Z3483 Encounter for supervision of other normal pregnancy, third trimester: Secondary | ICD-10-CM

## 2013-02-22 DIAGNOSIS — Z3480 Encounter for supervision of other normal pregnancy, unspecified trimester: Secondary | ICD-10-CM

## 2013-02-22 DIAGNOSIS — G5600 Carpal tunnel syndrome, unspecified upper limb: Secondary | ICD-10-CM

## 2013-02-22 NOTE — Progress Notes (Signed)
Carpal tunnel syndrome

## 2013-02-22 NOTE — Patient Instructions (Addendum)

## 2013-02-22 NOTE — Progress Notes (Signed)
P 85 Pt reports she has pain in thumb of her L hand so bad she can't use it. Pt also reports she is having some abdominal pain and pressure that comes and goes. Patient states contractions come and go also- had one before her appointment today. Urine specimen not provided by patient

## 2013-03-02 ENCOUNTER — Other Ambulatory Visit: Payer: Medicaid Other

## 2013-03-02 ENCOUNTER — Ambulatory Visit (INDEPENDENT_AMBULATORY_CARE_PROVIDER_SITE_OTHER): Payer: Medicaid Other | Admitting: Obstetrics & Gynecology

## 2013-03-02 ENCOUNTER — Encounter: Payer: Self-pay | Admitting: Obstetrics & Gynecology

## 2013-03-02 VITALS — BP 133/82 | Temp 98.2°F | Wt 332.0 lb

## 2013-03-02 DIAGNOSIS — Z3480 Encounter for supervision of other normal pregnancy, unspecified trimester: Secondary | ICD-10-CM

## 2013-03-02 DIAGNOSIS — O169 Unspecified maternal hypertension, unspecified trimester: Secondary | ICD-10-CM

## 2013-03-02 DIAGNOSIS — Z3483 Encounter for supervision of other normal pregnancy, third trimester: Secondary | ICD-10-CM

## 2013-03-02 MED ORDER — OXYCODONE-ACETAMINOPHEN 5-325 MG PO TABS: 2.0000 | ORAL_TABLET | Freq: Four times a day (QID) | ORAL | Status: DC | PRN

## 2013-03-02 MED ORDER — AMOXICILLIN 500 MG PO CAPS: 500.0000 mg | ORAL_CAPSULE | Freq: Three times a day (TID) | ORAL | Status: DC

## 2013-03-02 NOTE — Progress Notes (Signed)
Pulse-83 Pt c/o toothache upper left x 1 week.  patient did not provide urine specimen

## 2013-03-02 NOTE — Patient Instructions (Addendum)
Toothache  Toothaches are usually caused by tooth decay (cavity). However, other causes of toothache include:  · Gum disease.  · Cracked tooth.  · Cracked filling.  · Injury.  · Jaw problem (temporo mandibular joint or TMJ disorder).  · Tooth abscess.  · Root sensitivity.  · Grinding.  · Eruption problems.  Swelling and redness around a painful tooth often means you have a dental abscess.  Pain medicine and antibiotics can help reduce symptoms, but you will need to see a dentist within the next few days to have your problem properly evaluated and treated. If tooth decay is the problem, you may need a filling or root canal to save your tooth. If the problem is more severe, your tooth may need to be pulled.  SEEK IMMEDIATE MEDICAL CARE IF:  · You cannot swallow.  · You develop severe swelling, increased redness, or increased pain in your mouth or face.  · You have a fever.  · You cannot open your mouth adequately.  Document Released: 10/01/2004 Document Revised: 11/16/2011 Document Reviewed: 11/21/2009  ExitCare® Patient Information ©2014 [employer].

## 2013-03-02 NOTE — Progress Notes (Signed)
B/Ps in range

## 2013-03-09 ENCOUNTER — Other Ambulatory Visit: Payer: Medicaid Other

## 2013-03-09 ENCOUNTER — Other Ambulatory Visit: Payer: Self-pay | Admitting: Obstetrics & Gynecology

## 2013-03-09 ENCOUNTER — Encounter: Payer: Self-pay | Admitting: Obstetrics

## 2013-03-09 ENCOUNTER — Ambulatory Visit (INDEPENDENT_AMBULATORY_CARE_PROVIDER_SITE_OTHER): Payer: Medicaid Other | Admitting: Obstetrics & Gynecology

## 2013-03-09 VITALS — BP 130/82 | Temp 98.0°F | Wt 333.8 lb

## 2013-03-09 DIAGNOSIS — Z3483 Encounter for supervision of other normal pregnancy, third trimester: Secondary | ICD-10-CM

## 2013-03-09 DIAGNOSIS — Z3480 Encounter for supervision of other normal pregnancy, unspecified trimester: Secondary | ICD-10-CM

## 2013-03-09 DIAGNOSIS — IMO0002 Reserved for concepts with insufficient information to code with codable children: Secondary | ICD-10-CM

## 2013-03-09 LAB — POCT URINALYSIS DIPSTICK
Glucose, UA: NEGATIVE
Leukocytes, UA: NEGATIVE
Nitrite, UA: NEGATIVE
Spec Grav, UA: 1.015
Urobilinogen, UA: NEGATIVE

## 2013-03-09 MED ORDER — OXYCODONE-ACETAMINOPHEN 5-325 MG PO TABS: 2.0000 | ORAL_TABLET | Freq: Four times a day (QID) | ORAL | Status: DC | PRN

## 2013-03-09 NOTE — Progress Notes (Signed)
Pulse-84 No complaints.

## 2013-03-13 ENCOUNTER — Ambulatory Visit (HOSPITAL_COMMUNITY)
Admission: RE | Admit: 2013-03-13 | Discharge: 2013-03-13 | Disposition: A | Discharge status: Home / Self Care | Payer: Medicaid Other | Source: Ambulatory Visit | Attending: Obstetrics & Gynecology | Admitting: Obstetrics & Gynecology

## 2013-03-13 DIAGNOSIS — O34219 Maternal care for unspecified type scar from previous cesarean delivery: Secondary | ICD-10-CM | POA: Insufficient documentation

## 2013-03-13 DIAGNOSIS — O10019 Pre-existing essential hypertension complicating pregnancy, unspecified trimester: Secondary | ICD-10-CM | POA: Insufficient documentation

## 2013-03-13 DIAGNOSIS — E669 Obesity, unspecified: Secondary | ICD-10-CM | POA: Insufficient documentation

## 2013-03-14 ENCOUNTER — Encounter: Payer: Self-pay | Admitting: Obstetrics & Gynecology

## 2013-03-16 ENCOUNTER — Encounter: Payer: Self-pay | Admitting: Obstetrics & Gynecology

## 2013-03-16 ENCOUNTER — Other Ambulatory Visit: Payer: Medicaid Other

## 2013-03-16 ENCOUNTER — Ambulatory Visit (INDEPENDENT_AMBULATORY_CARE_PROVIDER_SITE_OTHER): Payer: Medicaid Other | Admitting: Obstetrics & Gynecology

## 2013-03-16 VITALS — BP 140/90 | Temp 98.0°F | Wt 334.8 lb

## 2013-03-16 DIAGNOSIS — O163 Unspecified maternal hypertension, third trimester: Secondary | ICD-10-CM

## 2013-03-16 DIAGNOSIS — O169 Unspecified maternal hypertension, unspecified trimester: Secondary | ICD-10-CM

## 2013-03-16 DIAGNOSIS — Z3483 Encounter for supervision of other normal pregnancy, third trimester: Secondary | ICD-10-CM

## 2013-03-16 DIAGNOSIS — Z3480 Encounter for supervision of other normal pregnancy, unspecified trimester: Secondary | ICD-10-CM

## 2013-03-16 NOTE — Patient Instructions (Signed)
Patient information: Group B streptococcus and pregnancy (Beyond the Basics)  Authors Karen M Puopolo, MD, PhD Carol J Baker, MD Section Editors Charles J Lockwood, MD Daniel J Sexton, MD Deputy Editor Vanessa A Barss, MD Disclosures  All topics are updated as new evidence becomes available and our peer review process is complete.  Literature review current through: Feb 2014.  This topic last updated: Mar 07, 2012.  INTRODUCTION - Group B streptococcus (GBS) is a bacterium that can cause serious infections in pregnant women and newborn babies. GBS is one of many types of streptococcal bacteria, sometimes called "strep." This article discusses GBS, its effect on pregnant women and infants, and ways to prevent complications of GBS. More detailed information about GBS is available by subscription. (See "Group B streptococcal infection in pregnant women".) WHAT IS GROUP B STREP INFECTION? - GBS is commonly found in the digestive system and the vagina. In healthy adults, GBS is not harmful and does not cause problems. But in pregnant women and newborn infants, being infected with GBS can cause serious illness. Approximately one in three to four pregnant women in the [place] carries GBS in their gastrointestinal system and/or in their vagina. Carrying GBS is not the same as being infected. Carriers are not sick and do not need treatment during pregnancy. There is no treatment that can stop you from carrying GBS.  Pregnant women who are carriers of GBS infrequently become infected with GBS. GBS can cause urinary tract infections, infection of the amniotic fluid (bag of water), and infection of the uterus after delivery. GBS infections during pregnancy may lead to preterm labor.  Pregnant women who carry GBS can pass on the bacteria to their newborns, and some of those babies become infected with GBS. Newborns who are infected with GBS can develop pneumonia (lung infection), septicemia (blood infection), or  meningitis (infection of the lining of the brain and spinal cord). These complications can be prevented by giving intravenous antibiotics during labor to any woman who is at risk of GBS infection. You are at risk of GBS infection if: You have a urine culture during your current pregnancy showing GBS  You have a vaginal and rectal culture during your current pregnancy showing GBS  You had an infant infected with GBS in the past GROUP B STREP PREVENTION - Most doctors and nurses recommend a urine culture early in your pregnancy to be sure that you do not have a bladder infection without symptoms. If you urine culture shows GBS or other bacteria, you may be treated with an antibiotic. If you have symptoms of urinary infection, such as pain with urination, any time during your pregnancy, a urine culture is done. If GBS grows from the urine culture, it should be treated with an antibiotic, and you should also receive intravenous antibiotics during labor. Expert groups recommend that all pregnant women have a GBS culture at 35 to 37 weeks of pregnancy. The culture is done by swabbing the vagina and rectum. If your GBS culture is positive, you will be given an intravenous antibiotic during labor. If you have preterm labor, the culture is done then and an intravenous antibiotic is given until the baby is born or the labor is stopped by your health care provider. If you have a positive GBS culture and you have an allergy to penicillin, be sure your doctor and nurse are aware of this allergy and tell them what happened with the allergy. If you had only a rash or itching, this   is not a serious allergy, and you can receive a common drug related to the penicillin. If you had a serious allergy (for example, trouble breathing, swelling of your face) you may need an additional test to determine which antibiotic should be used during labor. Being treated with an antibiotic during labor greatly reduces the chance that you or  your newborn will develop infections related to GBS. It is important to note that young infants up to age 3 months can also develop septicemia, meningitis and other serious infections from GBS. Being treated with an antibiotic during labor does not reduce the chance that your baby will develop this later type of infection. There is currently no known way of preventing this later-onset GBS disease. WHERE TO GET MORE INFORMATION - Your healthcare provider is the best source of information for questions and concerns related to your medical problem.  

## 2013-03-16 NOTE — Progress Notes (Signed)
Doing well 

## 2013-03-16 NOTE — Progress Notes (Signed)
Pulse-77 No complaints.  Patient did not provided urine specimen

## 2013-03-22 ENCOUNTER — Encounter: Payer: Self-pay | Admitting: Obstetrics & Gynecology

## 2013-03-23 ENCOUNTER — Other Ambulatory Visit: Payer: Medicaid Other

## 2013-03-23 ENCOUNTER — Encounter: Payer: Medicaid Other | Admitting: Obstetrics & Gynecology

## 2013-03-27 ENCOUNTER — Ambulatory Visit (INDEPENDENT_AMBULATORY_CARE_PROVIDER_SITE_OTHER): Payer: Medicaid Other | Admitting: Obstetrics & Gynecology

## 2013-03-27 ENCOUNTER — Other Ambulatory Visit: Payer: Medicaid Other

## 2013-03-27 VITALS — BP 140/83 | Temp 98.2°F | Wt 331.4 lb

## 2013-03-27 DIAGNOSIS — Z3480 Encounter for supervision of other normal pregnancy, unspecified trimester: Secondary | ICD-10-CM

## 2013-03-27 DIAGNOSIS — O169 Unspecified maternal hypertension, unspecified trimester: Secondary | ICD-10-CM

## 2013-03-27 DIAGNOSIS — Z3483 Encounter for supervision of other normal pregnancy, third trimester: Secondary | ICD-10-CM

## 2013-03-27 LAB — POCT URINALYSIS DIPSTICK
Nitrite, UA: NEGATIVE
Urobilinogen, UA: NEGATIVE
pH, UA: 7

## 2013-03-27 NOTE — Progress Notes (Signed)
Pulse- 87 

## 2013-03-27 NOTE — Addendum Note (Signed)
Addended by: George Hugh on: 03/27/2013 05:12 PM   Modules accepted: Orders

## 2013-03-30 LAB — STREP B DNA PROBE: GBSP: POSITIVE

## 2013-04-03 ENCOUNTER — Encounter: Payer: Self-pay | Admitting: Obstetrics & Gynecology

## 2013-04-03 ENCOUNTER — Ambulatory Visit (INDEPENDENT_AMBULATORY_CARE_PROVIDER_SITE_OTHER): Payer: Medicaid Other | Admitting: Obstetrics & Gynecology

## 2013-04-03 ENCOUNTER — Other Ambulatory Visit: Payer: Medicaid Other

## 2013-04-03 VITALS — BP 132/82 | Temp 97.3°F | Wt 329.4 lb

## 2013-04-03 DIAGNOSIS — O169 Unspecified maternal hypertension, unspecified trimester: Secondary | ICD-10-CM

## 2013-04-03 DIAGNOSIS — Z3483 Encounter for supervision of other normal pregnancy, third trimester: Secondary | ICD-10-CM

## 2013-04-03 DIAGNOSIS — Z3480 Encounter for supervision of other normal pregnancy, unspecified trimester: Secondary | ICD-10-CM

## 2013-04-03 DIAGNOSIS — IMO0002 Reserved for concepts with insufficient information to code with codable children: Secondary | ICD-10-CM

## 2013-04-03 LAB — POCT URINALYSIS DIPSTICK
Blood, UA: NEGATIVE
Nitrite, UA: NEGATIVE
Spec Grav, UA: 1.01
Urobilinogen, UA: NEGATIVE
pH, UA: 6

## 2013-04-03 NOTE — Patient Instructions (Signed)

## 2013-04-03 NOTE — Progress Notes (Signed)
Pulse-86 Pt c/o intermittent lower backache with constant sharp aching pain radiating from navel to vagina to rectum x 2 weeks. Pt c/o pain at "fat pocket" on upper middle abdomen.

## 2013-04-10 ENCOUNTER — Ambulatory Visit (INDEPENDENT_AMBULATORY_CARE_PROVIDER_SITE_OTHER): Payer: Medicaid Other | Admitting: Obstetrics & Gynecology

## 2013-04-10 ENCOUNTER — Other Ambulatory Visit: Payer: Self-pay | Admitting: *Deleted

## 2013-04-10 ENCOUNTER — Other Ambulatory Visit: Payer: Medicaid Other

## 2013-04-10 ENCOUNTER — Other Ambulatory Visit: Payer: Self-pay | Admitting: Obstetrics & Gynecology

## 2013-04-10 VITALS — BP 111/62 | Temp 97.8°F | Wt 329.4 lb

## 2013-04-10 DIAGNOSIS — Z3483 Encounter for supervision of other normal pregnancy, third trimester: Secondary | ICD-10-CM

## 2013-04-10 DIAGNOSIS — IMO0002 Reserved for concepts with insufficient information to code with codable children: Secondary | ICD-10-CM

## 2013-04-10 DIAGNOSIS — Z3480 Encounter for supervision of other normal pregnancy, unspecified trimester: Secondary | ICD-10-CM

## 2013-04-10 DIAGNOSIS — O169 Unspecified maternal hypertension, unspecified trimester: Secondary | ICD-10-CM

## 2013-04-10 LAB — POCT URINALYSIS DIPSTICK
Blood, UA: NEGATIVE
Glucose, UA: NEGATIVE
Leukocytes, UA: NEGATIVE
Nitrite, UA: NEGATIVE
Spec Grav, UA: 1.015
Urobilinogen, UA: NEGATIVE

## 2013-04-10 NOTE — Patient Instructions (Signed)
Cesarean Delivery  Cesarean delivery is the birth of a baby through a cut (incision) in the abdomen and womb (uterus).  LET YOUR CAREGIVER KNOW ABOUT:  Complicationsinvolving the pregnancy.  Allergies.  Medicines taken including herbs, eyedrops, over-the-counter medicines, and creams.  Use of steroids (by mouth or creams).  Previous problems with anesthetics or numbing medicine.  Previous surgery.  History of blood clots.  History of bleeding or blood problems.  Other health problems. RISKS AND COMPLICATIONS   Bleeding.  Infection.  Blood clots.  Injury to surrounding organs.  Anesthesia problems.  Injury to the baby. BEFORE THE PROCEDURE   A tube (Foley catheter) will be placed in your bladder. The Foley catheter drains the urine from your bladder into a bag. This keeps your bladder empty during surgery.  An intravenous access tube (IV) will be placed in your arm.  Hair may be removed from your pubic area and your lower abdomen. This is to prevent infection in the incision site.  You may be given an antacid medicine to drink. This will prevent acid contents in your stomach from going into your lungs if you vomit during the surgery.  You may be given an antibiotic medicine to prevent infection. PROCEDURE   You may be given medicine to numb the lower half of your body (regional anesthetic). If you were in labor, you may have already had an epidural in place which can be used in both labor and cesarean delivery. You may possibly be given medicine to make you sleep (general anesthetic) though this is not as common.  An incision will be made in your abdomen that extends to your uterus. There are 2 basic kinds of incisions:  The horizontal (transverse) incision. Horizontal incisions are used for most routine cesarean deliveries.  The vertical (up and down) incision. This is less commonly used. This is most often reserved for women who have a serious complication  (extreme prematurity) or under emergency situations.  The horizontal and vertical incisions may both be used at the same time. However, this is very uncommon.  Your baby will then be delivered. AFTER THE PROCEDURE   If you were awake during the surgery, you will see your baby right away. If you were asleep, you will see your baby as soon as you are awake.  You may breastfeed your baby after surgery.  You may be able to get up and walk the same day as the surgery. If you need to stay in bed for a period of time, you will receive help to turn, cough, and take deep breaths after surgery. This helps prevent lung problems such as pneumonia.  Do not get out of bed alone the first time after surgery. You will need help getting out of bed until you are able to do this by yourself.  You may be able to shower the day after your cesarean delivery. After the bandage (dressing) is taken off the incision site, a nurse will assist you to shower, if you like.  You will have pneumatic compressing hose placed on your feet or lower legs. These hose are used to prevent blood clots. When you are up and walking regularly, they will no longer be necessary.  Do not cross your legs when you sit.  Save any blood clots that you pass. If you pass a clot while on the toilet, do not flush it. Call for the nurse. Tell the nurse if you think you are bleeding too much or passing too many   clots.  Start drinking liquids and eating food as directed by your caregiver. If your stomach is not ready, drinking and eating too soon can cause an increase in bloating and swelling of your intestine and abdomen. This is very uncomfortable.  You will be given medicine as needed. Let your caregivers know if you are hurting. They want you to be comfortable. You may also be given an antibiotic to prevent an infection.  Your IV will be taken out when you are drinking a reasonable amount of fluids. The Foley catheter is taken out when  you are up and walking.  If your blood type is Rh negative and your baby's blood type is Rh positive, you will be given a shot of anti-D immune globulin. This shot prevents you from having Rh problems with a future pregnancy. You should get the shot even if you had your tubes tied (tubal ligation).  If you are allowed to take the baby for a walk, place the baby in the bassinet and push it. Do not carry your baby in your arms. Document Released: 08/24/2005 Document Revised: 11/16/2011 Document Reviewed: 12/19/2010 [employer] Patient Information 2014 [employer].  

## 2013-04-10 NOTE — Progress Notes (Signed)
Pulse-84 No new complaints.

## 2013-04-10 NOTE — Progress Notes (Signed)
C/O pelvic pain

## 2013-04-11 ENCOUNTER — Ambulatory Visit (HOSPITAL_COMMUNITY)
Admission: RE | Admit: 2013-04-11 | Discharge: 2013-04-11 | Disposition: A | Discharge status: Home / Self Care | Payer: Medicaid Other | Source: Ambulatory Visit | Attending: Obstetrics & Gynecology | Admitting: Obstetrics & Gynecology

## 2013-04-11 DIAGNOSIS — O34219 Maternal care for unspecified type scar from previous cesarean delivery: Secondary | ICD-10-CM | POA: Insufficient documentation

## 2013-04-11 DIAGNOSIS — O99210 Obesity complicating pregnancy, unspecified trimester: Secondary | ICD-10-CM | POA: Insufficient documentation

## 2013-04-11 DIAGNOSIS — IMO0002 Reserved for concepts with insufficient information to code with codable children: Secondary | ICD-10-CM

## 2013-04-11 DIAGNOSIS — E669 Obesity, unspecified: Secondary | ICD-10-CM | POA: Insufficient documentation

## 2013-04-11 DIAGNOSIS — O10019 Pre-existing essential hypertension complicating pregnancy, unspecified trimester: Secondary | ICD-10-CM | POA: Insufficient documentation

## 2013-04-13 ENCOUNTER — Encounter (HOSPITAL_COMMUNITY): Payer: Self-pay | Admitting: Pharmacist

## 2013-04-16 ENCOUNTER — Other Ambulatory Visit: Payer: Self-pay | Admitting: *Deleted

## 2013-04-17 ENCOUNTER — Encounter: Payer: Self-pay | Admitting: Obstetrics & Gynecology

## 2013-04-17 ENCOUNTER — Other Ambulatory Visit: Payer: Medicaid Other

## 2013-04-17 ENCOUNTER — Ambulatory Visit: Payer: Medicaid Other | Admitting: Obstetrics & Gynecology

## 2013-04-17 ENCOUNTER — Ambulatory Visit (INDEPENDENT_AMBULATORY_CARE_PROVIDER_SITE_OTHER): Payer: Medicaid Other | Admitting: Obstetrics & Gynecology

## 2013-04-17 VITALS — BP 126/63 | Temp 97.3°F | Wt 327.8 lb

## 2013-04-17 DIAGNOSIS — Z3483 Encounter for supervision of other normal pregnancy, third trimester: Secondary | ICD-10-CM

## 2013-04-17 DIAGNOSIS — IMO0002 Reserved for concepts with insufficient information to code with codable children: Secondary | ICD-10-CM

## 2013-04-17 DIAGNOSIS — Z3480 Encounter for supervision of other normal pregnancy, unspecified trimester: Secondary | ICD-10-CM

## 2013-04-17 NOTE — Progress Notes (Signed)
Pulse-79 Pt reports scheduled c-section for 04/20/2013 and pre-op is 04/18/13.

## 2013-04-17 NOTE — Progress Notes (Signed)
Urine specimen not provided by patient

## 2013-04-17 NOTE — Progress Notes (Signed)
Doing well 

## 2013-04-18 ENCOUNTER — Encounter (HOSPITAL_COMMUNITY): Payer: Self-pay

## 2013-04-18 ENCOUNTER — Encounter (HOSPITAL_COMMUNITY)
Admission: RE | Admit: 2013-04-18 | Discharge: 2013-04-18 | Disposition: A | Discharge status: Home / Self Care | Payer: Medicaid Other | Source: Ambulatory Visit | Attending: Obstetrics & Gynecology | Admitting: Obstetrics & Gynecology

## 2013-04-18 VITALS — BP 134/80 | Ht 69.0 in | Wt 329.0 lb

## 2013-04-18 DIAGNOSIS — Z720 Tobacco use: Secondary | ICD-10-CM

## 2013-04-18 DIAGNOSIS — G5600 Carpal tunnel syndrome, unspecified upper limb: Secondary | ICD-10-CM

## 2013-04-18 DIAGNOSIS — O162 Unspecified maternal hypertension, second trimester: Secondary | ICD-10-CM

## 2013-04-18 HISTORY — DX: Cardiac arrhythmia, unspecified: I49.9

## 2013-04-18 LAB — BASIC METABOLIC PANEL
BUN: 7 mg/dL (ref 6–23)
Calcium: 9.1 mg/dL (ref 8.4–10.5)
GFR calc Af Amer: >90 mL/min (ref >90)
GFR calc non Af Amer: >90 mL/min (ref >90)
Glucose, Bld: 98 mg/dL (ref 70–99)
Potassium: 3.6 mEq/L (ref 3.5–5.1)
Sodium: 135 mEq/L (ref 135–145)

## 2013-04-18 LAB — CBC
HCT: 32.7 % — ABNORMAL LOW (ref 36.0–46.0)
Hemoglobin: 11.5 g/dL — ABNORMAL LOW (ref 12.0–15.0)
MCV: 92.4 fL (ref 78.0–100.0)
WBC: 10.4 10*3/uL (ref 4.0–10.5)

## 2013-04-18 NOTE — Patient Instructions (Addendum)
Your procedure is scheduled on:04/20/13  Enter through the Main Entrance at :7am Pick up desk phone and dial [phone] and inform us of your arrival.  Please call [phone] if you have any problems the morning of surgery.  Remember: Do not eat food or drink liquids, including water, after midnight:WED   You may brush your teeth the morning of surgery.  Take these meds the morning of surgery with a sip of water: nexium, norvasc, labetalol  DO NOT wear jewelry, eye make-up, lipstick,body lotion, or dark fingernail polish.  (Polished toes are ok) You may wear deodorant.  If you are to be admitted after surgery, leave suitcase in car until your room has been assigned. Patients discharged on the day of surgery will not be allowed to drive home. Wear loose fitting, comfortable clothes for your ride home.

## 2013-04-19 MED ORDER — DEXTROSE 5 % IV SOLN
3.0000 g | INTRAVENOUS | Status: DC
  Filled 2013-04-19: qty 3000

## 2013-04-19 MED ORDER — DEXTROSE 5 % IV SOLN
3.0000 g | INTRAVENOUS | Status: AC
  Administered 2013-04-20: 3 g via INTRAVENOUS
  Filled 2013-04-19: qty 3000

## 2013-04-19 MED ORDER — ENOXAPARIN SODIUM 30 MG/0.3ML ~~LOC~~ SOLN
30.0000 mg | Freq: Once | SUBCUTANEOUS | Status: DC
  Filled 2013-04-19: qty 0.3

## 2013-04-19 NOTE — H&P (Signed)
Sydney Griffin is a 32 y.o. female presenting for a repeat C/D. Maternal Medical History:  Reason for admission: There is a h/o a prior C/D <18 mths ago.  She presents for an elective repeat C/D.  Fetal activity: Perceived fetal activity is normal.    Prenatal complications: PIH.     Prior to Admission medications   Medication Sig Start Date End Date Taking? Authorizing Provider  acetaminophen (TYLENOL) 500 MG tablet Take 1,000 mg by mouth every 6 (six) hours as needed. For pain    Historical Provider, MD  amLODipine (NORVASC) 5 MG tablet Take 1 tablet (5 mg total) by mouth daily. 04/11/12 04/20/13  Brock Bad, MD  esomeprazole (NEXIUM) 40 MG capsule Take 40 mg by mouth daily as needed.     Historical Provider, MD  labetalol (NORMODYNE) 200 MG tablet Take 200 mg by mouth daily.    Historical Provider, MD  oxyCODONE-acetaminophen (PERCOCET/ROXICET) 5-325 MG per tablet Take 2 tablets by mouth every 6 (six) hours as needed for pain. 03/09/13   Antionette Char, MD   OB History   Grav Para Term Preterm Abortions TAB SAB Ect Mult Living   4 2 2  0 1   1  2      Past Medical History  Diagnosis Date  . Acid reflux   . Menstrual bleeding problem   . Headache(784.0)   . Ectopic pregnancy     Had MTX  . Hypertension   . Asthma May 2013    info from [facility] states asthma but pt denies and said she had bronchitis, used an Hydrologist while sick and has now lost the inhaler.  Marland Kitchen Dysrhythmia     rare palpitations   Past Surgical History  Procedure Laterality Date  . Cesarean section    . Dilation and curettage of uterus    . Laparoscopy    . Cesarean section  03/28/2012    Procedure: CESAREAN SECTION;  Surgeon: Antionette Char, MD;  Location: [facility];  Service: Gynecology;  Laterality: N/A;   Family History: family history includes Diabetes in her father and mother; Hypertension in her brother and mother. There is no history of Anesthesia problems or Other. Social History:  reports that  she has been smoking Cigarettes.  She has been smoking about 0.50 packs per day. She has never used smokeless tobacco. She reports that she does not drink alcohol or use illicit drugs.   Prenatal Transfer Tool  Maternal Diabetes: No Genetic Screening: Normal Maternal Ultrasounds/Referrals: Normal Fetal Ultrasounds or other Referrals:  None Maternal Substance Abuse:  Yes:  Type: Smoker Significant Maternal Medications:  Meds include: Other: Percocet, Norvasc, Labetalol, Nexium Significant Maternal Lab Results:  Lab values include: Group B Strep positive Other Comments:  None  Review of Systems  Constitutional: Negative for fever.  Eyes: Negative for blurred vision.  Respiratory: Negative for shortness of breath.   Gastrointestinal: Negative for vomiting.  Skin: Negative for rash.  Neurological: Negative for headaches.      Maternal Exam:  Abdomen: Patient reports no abdominal tenderness. Fetal presentation: vertex  Introitus: not evaluated.     Fetal Exam Fetal Monitor Review: Variability: moderate (6-25 bpm).   Pattern: accelerations present and no decelerations.    Fetal State Assessment: Category I - tracings are normal.     Physical Exam  Constitutional: She appears well-developed.  HENT:  Head: Normocephalic.  Neck: Neck supple. No thyromegaly present.  Cardiovascular: Normal rate and regular rhythm.   Respiratory: Breath sounds normal.  GI: Soft. Bowel sounds are normal.  Skin: No rash noted.    Prenatal labs: ABO, Rh:   Antibody:   Rubella:   RPR: NON REACTIVE (08/12 1420)  HBsAg:    HIV: NON REACTIVE (05/07 1315)  GBS: POSITIVE (07/21 1713)   Assessment/Plan: IUP @ 39 weeks w/a h/o a previous C/D < 18 mths ago; chronic hypertension, obesity  Admit Repeat C/D    JACKSON-MOORE,Debralee Braaksma A 04/19/2013, 8:44 PM

## 2013-04-20 ENCOUNTER — Encounter (HOSPITAL_COMMUNITY)
Admission: AD | Disposition: A | Discharge status: Home / Self Care | Payer: Self-pay | Source: Ambulatory Visit | Attending: Obstetrics & Gynecology

## 2013-04-20 ENCOUNTER — Inpatient Hospital Stay (HOSPITAL_COMMUNITY)
Admission: AD | Admit: 2013-04-20 | Discharge: 2013-04-25 | DRG: 766 | Disposition: A | Discharge status: Home / Self Care | Payer: Medicaid Other | Source: Ambulatory Visit | Attending: Obstetrics & Gynecology | Admitting: Obstetrics & Gynecology

## 2013-04-20 ENCOUNTER — Inpatient Hospital Stay (HOSPITAL_COMMUNITY): Payer: Medicaid Other | Admitting: Anesthesiology

## 2013-04-20 ENCOUNTER — Encounter (HOSPITAL_COMMUNITY): Payer: Self-pay | Admitting: *Deleted

## 2013-04-20 ENCOUNTER — Encounter (HOSPITAL_COMMUNITY): Payer: Self-pay | Admitting: Anesthesiology

## 2013-04-20 DIAGNOSIS — Z720 Tobacco use: Secondary | ICD-10-CM

## 2013-04-20 DIAGNOSIS — O99892 Other specified diseases and conditions complicating childbirth: Secondary | ICD-10-CM | POA: Diagnosis present

## 2013-04-20 DIAGNOSIS — Z98891 History of uterine scar from previous surgery: Secondary | ICD-10-CM

## 2013-04-20 DIAGNOSIS — O99214 Obesity complicating childbirth: Secondary | ICD-10-CM | POA: Diagnosis present

## 2013-04-20 DIAGNOSIS — Z22330 Carrier of Group B streptococcus: Secondary | ICD-10-CM

## 2013-04-20 DIAGNOSIS — O139 Gestational [pregnancy-induced] hypertension without significant proteinuria, unspecified trimester: Principal | ICD-10-CM | POA: Diagnosis present

## 2013-04-20 DIAGNOSIS — O34219 Maternal care for unspecified type scar from previous cesarean delivery: Secondary | ICD-10-CM | POA: Diagnosis present

## 2013-04-20 DIAGNOSIS — E669 Obesity, unspecified: Secondary | ICD-10-CM | POA: Diagnosis present

## 2013-04-20 DIAGNOSIS — G5600 Carpal tunnel syndrome, unspecified upper limb: Secondary | ICD-10-CM

## 2013-04-20 LAB — MRSA CULTURE

## 2013-04-20 LAB — TYPE AND SCREEN
ABO/RH(D): O POS
Antibody Screen: NEGATIVE

## 2013-04-20 LAB — CBC
HCT: 31.4 % — ABNORMAL LOW (ref 36.0–46.0)
Hemoglobin: 11 g/dL — ABNORMAL LOW (ref 12.0–15.0)
RDW: 13.4 % (ref 11.5–15.5)
WBC: 9.8 10*3/uL (ref 4.0–10.5)

## 2013-04-20 SURGERY — Surgical Case
Anesthesia: Spinal | Site: Abdomen | Wound class: Clean Contaminated

## 2013-04-20 MED ORDER — LACTATED RINGERS IV SOLN
INTRAVENOUS | Status: AC
  Administered 2013-04-20 – 2013-04-22 (×5): via INTRAVENOUS

## 2013-04-20 MED ORDER — ONDANSETRON HCL 4 MG/2ML IJ SOLN
INTRAMUSCULAR | Status: DC | PRN
  Administered 2013-04-20: 4 mg via INTRAVENOUS

## 2013-04-20 MED ORDER — ACETAMINOPHEN 10 MG/ML IV SOLN
1000.0000 mg | Freq: Once | INTRAVENOUS | Status: AC
  Administered 2013-04-20: 1000 mg via INTRAVENOUS
  Filled 2013-04-20: qty 100

## 2013-04-20 MED ORDER — ACETAMINOPHEN 160 MG/5ML PO SOLN
975.0000 mg | Freq: Four times a day (QID) | ORAL | Status: DC | PRN
  Filled 2013-04-20: qty 40

## 2013-04-20 MED ORDER — LANOLIN HYDROUS EX OINT: 1.0000 "application " | TOPICAL_OINTMENT | CUTANEOUS | Status: DC | PRN

## 2013-04-20 MED ORDER — MORPHINE SULFATE (PF) 0.5 MG/ML IJ SOLN
INTRAMUSCULAR | Status: DC | PRN
  Administered 2013-04-20: .1 mg via INTRATHECAL

## 2013-04-20 MED ORDER — ONDANSETRON HCL 4 MG/2ML IJ SOLN
INTRAMUSCULAR | Status: AC
  Filled 2013-04-20: qty 2

## 2013-04-20 MED ORDER — DEXTROSE 5 % IV SOLN
500.0000 mg | Freq: Once | INTRAVENOUS | Status: AC
  Administered 2013-04-20: 500 mg via INTRAVENOUS
  Filled 2013-04-20: qty 500

## 2013-04-20 MED ORDER — ONDANSETRON HCL 4 MG/2ML IJ SOLN: 4.0000 mg | Freq: Three times a day (TID) | INTRAMUSCULAR | Status: DC | PRN

## 2013-04-20 MED ORDER — ONDANSETRON HCL 4 MG/2ML IJ SOLN: 4.0000 mg | INTRAMUSCULAR | Status: DC | PRN

## 2013-04-20 MED ORDER — LACTATED RINGERS IV SOLN
Freq: Once | INTRAVENOUS | Status: AC
  Administered 2013-04-20: 10:00:00 via INTRAVENOUS

## 2013-04-20 MED ORDER — SCOPOLAMINE 1 MG/3DAYS TD PT72: 1.0000 | MEDICATED_PATCH | Freq: Once | TRANSDERMAL | Status: DC

## 2013-04-20 MED ORDER — FENTANYL CITRATE 0.05 MG/ML IJ SOLN
INTRAMUSCULAR | Status: AC
  Filled 2013-04-20: qty 2

## 2013-04-20 MED ORDER — SCOPOLAMINE 1 MG/3DAYS TD PT72
1.0000 | MEDICATED_PATCH | Freq: Once | TRANSDERMAL | Status: DC
  Filled 2013-04-20: qty 1

## 2013-04-20 MED ORDER — SCOPOLAMINE 1 MG/3DAYS TD PT72
MEDICATED_PATCH | TRANSDERMAL | Status: AC
  Administered 2013-04-20: 1.5 mg via TRANSDERMAL
  Filled 2013-04-20: qty 1

## 2013-04-20 MED ORDER — LABETALOL HCL 5 MG/ML IV SOLN
INTRAVENOUS | Status: AC
  Administered 2013-04-20: 20 mg via INTRAVENOUS
  Filled 2013-04-20: qty 4

## 2013-04-20 MED ORDER — DIBUCAINE 1 % RE OINT: 1.0000 "application " | TOPICAL_OINTMENT | RECTAL | Status: DC | PRN

## 2013-04-20 MED ORDER — DIPHENHYDRAMINE HCL 50 MG/ML IJ SOLN: 12.5000 mg | INTRAMUSCULAR | Status: DC | PRN

## 2013-04-20 MED ORDER — PRENATAL MULTIVITAMIN CH
1.0000 | ORAL_TABLET | Freq: Every day | ORAL | Status: DC
  Administered 2013-04-21: 1 via ORAL
  Filled 2013-04-20 (×3): qty 1

## 2013-04-20 MED ORDER — NALBUPHINE HCL 10 MG/ML IJ SOLN
5.0000 mg | INTRAMUSCULAR | Status: DC | PRN
  Filled 2013-04-20: qty 1

## 2013-04-20 MED ORDER — SENNOSIDES-DOCUSATE SODIUM 8.6-50 MG PO TABS
2.0000 | ORAL_TABLET | Freq: Every day | ORAL | Status: DC
  Administered 2013-04-20 – 2013-04-24 (×4): 2 via ORAL

## 2013-04-20 MED ORDER — SIMETHICONE 80 MG PO CHEW
80.0000 mg | CHEWABLE_TABLET | Freq: Three times a day (TID) | ORAL | Status: DC
  Administered 2013-04-20 – 2013-04-25 (×12): 80 mg via ORAL

## 2013-04-20 MED ORDER — MEPERIDINE HCL 25 MG/ML IJ SOLN
INTRAMUSCULAR | Status: AC
  Filled 2013-04-20: qty 1

## 2013-04-20 MED ORDER — BUPIVACAINE IN DEXTROSE 0.75-8.25 % IT SOLN
INTRATHECAL | Status: AC
  Filled 2013-04-20: qty 2

## 2013-04-20 MED ORDER — LABETALOL HCL 5 MG/ML IV SOLN: 20.0000 mg | Freq: Once | INTRAVENOUS | Status: DC

## 2013-04-20 MED ORDER — OXYCODONE-ACETAMINOPHEN 5-325 MG PO TABS
1.0000 | ORAL_TABLET | ORAL | Status: DC | PRN
  Administered 2013-04-20 – 2013-04-22 (×6): 2 via ORAL
  Administered 2013-04-22: 1 via ORAL
  Administered 2013-04-22 – 2013-04-23 (×4): 2 via ORAL
  Administered 2013-04-23: 1 via ORAL
  Administered 2013-04-23 – 2013-04-25 (×9): 2 via ORAL
  Filled 2013-04-20 (×17): qty 2

## 2013-04-20 MED ORDER — SIMETHICONE 80 MG PO CHEW: 80.0000 mg | CHEWABLE_TABLET | ORAL | Status: DC | PRN

## 2013-04-20 MED ORDER — WITCH HAZEL-GLYCERIN EX PADS: 1.0000 "application " | MEDICATED_PAD | CUTANEOUS | Status: DC | PRN

## 2013-04-20 MED ORDER — MENTHOL 3 MG MT LOZG: 1.0000 | LOZENGE | OROMUCOSAL | Status: DC | PRN

## 2013-04-20 MED ORDER — MEPERIDINE HCL 25 MG/ML IJ SOLN
6.2500 mg | INTRAMUSCULAR | Status: DC | PRN
  Administered 2013-04-20: 12.5 mg via INTRAVENOUS

## 2013-04-20 MED ORDER — TETANUS-DIPHTH-ACELL PERTUSSIS 5-2.5-18.5 LF-MCG/0.5 IM SUSP
0.5000 mL | Freq: Once | INTRAMUSCULAR | Status: AC
  Administered 2013-04-21: 0.5 mL via INTRAMUSCULAR
  Filled 2013-04-20: qty 0.5

## 2013-04-20 MED ORDER — OXYTOCIN 10 UNIT/ML IJ SOLN
INTRAMUSCULAR | Status: AC
  Filled 2013-04-20: qty 4

## 2013-04-20 MED ORDER — PHENYLEPHRINE HCL 10 MG/ML IJ SOLN
INTRAMUSCULAR | Status: DC | PRN
  Administered 2013-04-20 (×3): 80 ug via INTRAVENOUS
  Administered 2013-04-20: 40 ug via INTRAVENOUS
  Administered 2013-04-20: 80 ug via INTRAVENOUS
  Administered 2013-04-20 (×2): 40 ug via INTRAVENOUS
  Administered 2013-04-20 (×2): 80 ug via INTRAVENOUS

## 2013-04-20 MED ORDER — METOCLOPRAMIDE HCL 5 MG/ML IJ SOLN: 10.0000 mg | Freq: Three times a day (TID) | INTRAMUSCULAR | Status: DC | PRN

## 2013-04-20 MED ORDER — IBUPROFEN 600 MG PO TABS: 600.0000 mg | ORAL_TABLET | Freq: Four times a day (QID) | ORAL | Status: DC

## 2013-04-20 MED ORDER — OXYTOCIN 40 UNITS IN LACTATED RINGERS INFUSION - SIMPLE MED: 62.5000 mL/h | INTRAVENOUS | Status: AC

## 2013-04-20 MED ORDER — FENTANYL CITRATE 0.05 MG/ML IJ SOLN
INTRAMUSCULAR | Status: AC
  Administered 2013-04-20: 50 ug via INTRAVENOUS
  Filled 2013-04-20: qty 2

## 2013-04-20 MED ORDER — LIDOCAINE-EPINEPHRINE (PF) 2 %-1:200000 IJ SOLN
INTRAMUSCULAR | Status: AC
  Filled 2013-04-20: qty 20

## 2013-04-20 MED ORDER — LABETALOL HCL 5 MG/ML IV SOLN: 40.0000 mg | Freq: Once | INTRAVENOUS | Status: AC

## 2013-04-20 MED ORDER — NALOXONE HCL 1 MG/ML IJ SOLN
1.0000 ug/kg/h | INTRAVENOUS | Status: DC | PRN
  Filled 2013-04-20: qty 2

## 2013-04-20 MED ORDER — ACETAMINOPHEN 10 MG/ML IV SOLN
1000.0000 mg | Freq: Four times a day (QID) | INTRAVENOUS | Status: DC
  Administered 2013-04-20: 1000 mg via INTRAVENOUS
  Filled 2013-04-20: qty 100

## 2013-04-20 MED ORDER — OXYTOCIN 10 UNIT/ML IJ SOLN
INTRAMUSCULAR | Status: DC | PRN
  Administered 2013-04-20: 40 [IU] via INTRAMUSCULAR

## 2013-04-20 MED ORDER — MORPHINE SULFATE 0.5 MG/ML IJ SOLN
INTRAMUSCULAR | Status: AC
  Filled 2013-04-20: qty 10

## 2013-04-20 MED ORDER — LABETALOL HCL 5 MG/ML IV SOLN: 20.0000 mg | Freq: Once | INTRAVENOUS | Status: AC

## 2013-04-20 MED ORDER — PHENYLEPHRINE 40 MCG/ML (10ML) SYRINGE FOR IV PUSH (FOR BLOOD PRESSURE SUPPORT)
PREFILLED_SYRINGE | INTRAVENOUS | Status: AC
  Filled 2013-04-20: qty 15

## 2013-04-20 MED ORDER — SODIUM CHLORIDE 0.9 % IJ SOLN: 3.0000 mL | INTRAMUSCULAR | Status: DC | PRN

## 2013-04-20 MED ORDER — NALOXONE HCL 0.4 MG/ML IJ SOLN: 0.4000 mg | INTRAMUSCULAR | Status: DC | PRN

## 2013-04-20 MED ORDER — ONDANSETRON HCL 4 MG PO TABS: 4.0000 mg | ORAL_TABLET | ORAL | Status: DC | PRN

## 2013-04-20 MED ORDER — DIPHENHYDRAMINE HCL 25 MG PO CAPS: 25.0000 mg | ORAL_CAPSULE | Freq: Four times a day (QID) | ORAL | Status: DC | PRN

## 2013-04-20 MED ORDER — MEPERIDINE HCL 25 MG/ML IJ SOLN
INTRAMUSCULAR | Status: DC | PRN
  Administered 2013-04-20: 12.5 mg via INTRAVENOUS

## 2013-04-20 MED ORDER — FENTANYL CITRATE 0.05 MG/ML IJ SOLN
INTRAMUSCULAR | Status: DC | PRN
  Administered 2013-04-20: 15 ug via INTRATHECAL

## 2013-04-20 MED ORDER — OXYCODONE-ACETAMINOPHEN 5-325 MG PO TABS
1.0000 | ORAL_TABLET | ORAL | Status: DC | PRN
  Filled 2013-04-20 (×6): qty 2

## 2013-04-20 MED ORDER — DIPHENHYDRAMINE HCL 25 MG PO CAPS: 25.0000 mg | ORAL_CAPSULE | ORAL | Status: DC | PRN

## 2013-04-20 MED ORDER — SODIUM BICARBONATE 8.4 % IV SOLN
INTRAVENOUS | Status: AC
  Filled 2013-04-20: qty 50

## 2013-04-20 MED ORDER — LACTATED RINGERS IV SOLN
INTRAVENOUS | Status: DC
  Administered 2013-04-20 (×2): via INTRAVENOUS

## 2013-04-20 MED ORDER — ENOXAPARIN SODIUM 40 MG/0.4ML ~~LOC~~ SOLN
40.0000 mg | SUBCUTANEOUS | Status: DC
  Administered 2013-04-21 – 2013-04-25 (×5): 40 mg via SUBCUTANEOUS
  Filled 2013-04-20 (×7): qty 0.4

## 2013-04-20 MED ORDER — BUPIVACAINE IN DEXTROSE 0.75-8.25 % IT SOLN
INTRATHECAL | Status: DC | PRN
  Administered 2013-04-20: 1.6 mL via INTRATHECAL

## 2013-04-20 MED ORDER — SODIUM CHLORIDE 0.9 % IR SOLN
Status: DC | PRN
  Administered 2013-04-20: 1000 mL

## 2013-04-20 MED ORDER — ZOLPIDEM TARTRATE 5 MG PO TABS: 5.0000 mg | ORAL_TABLET | Freq: Every evening | ORAL | Status: DC | PRN

## 2013-04-20 MED ORDER — FENTANYL CITRATE 0.05 MG/ML IJ SOLN
25.0000 ug | INTRAMUSCULAR | Status: DC | PRN
  Administered 2013-04-20 (×2): 50 ug via INTRAVENOUS

## 2013-04-20 MED ORDER — DIPHENHYDRAMINE HCL 50 MG/ML IJ SOLN: 25.0000 mg | INTRAMUSCULAR | Status: DC | PRN

## 2013-04-20 SURGICAL SUPPLY — 41 items
APL SKNCLS STERI-STRIP NONHPOA (GAUZE/BANDAGES/DRESSINGS) ×1
BENZOIN TINCTURE PRP APPL 2/3 (GAUZE/BANDAGES/DRESSINGS) ×2 IMPLANT
CANISTER WOUND CARE 500ML ATS (WOUND CARE) IMPLANT
CLAMP CORD UMBIL (MISCELLANEOUS) IMPLANT
CLOTH BEACON ORANGE TIMEOUT ST (SAFETY) ×2 IMPLANT
CONTAINER PREFILL 10% NBF 15ML (MISCELLANEOUS) IMPLANT
DRAPE LG THREE QUARTER DISP (DRAPES) ×2 IMPLANT
DRSG OPSITE POSTOP 4X10 (GAUZE/BANDAGES/DRESSINGS) ×2 IMPLANT
DRSG VAC ATS LRG SENSATRAC (GAUZE/BANDAGES/DRESSINGS) IMPLANT
DRSG VAC ATS MED SENSATRAC (GAUZE/BANDAGES/DRESSINGS) ×1 IMPLANT
DRSG VAC ATS SM SENSATRAC (GAUZE/BANDAGES/DRESSINGS) IMPLANT
DURAPREP 26ML APPLICATOR (WOUND CARE) ×2 IMPLANT
ELECT REM PT RETURN 9FT ADLT (ELECTROSURGICAL) ×2
ELECTRODE REM PT RTRN 9FT ADLT (ELECTROSURGICAL) ×1 IMPLANT
EXTRACTOR VACUUM M CUP 4 TUBE (SUCTIONS) IMPLANT
GLOVE BIO SURGEON STRL SZ 6.5 (GLOVE) ×2 IMPLANT
GOWN STRL REIN XL XLG (GOWN DISPOSABLE) ×4 IMPLANT
KIT ABG SYR 3ML LUER SLIP (SYRINGE) IMPLANT
NDL HYPO 25X5/8 SAFETYGLIDE (NEEDLE) ×1 IMPLANT
NEEDLE HYPO 25X5/8 SAFETYGLIDE (NEEDLE) ×2 IMPLANT
NS IRRIG 1000ML POUR BTL (IV SOLUTION) ×2 IMPLANT
PACK C SECTION WH (CUSTOM PROCEDURE TRAY) ×2 IMPLANT
PAD OB MATERNITY 4.3X12.25 (PERSONAL CARE ITEMS) ×2 IMPLANT
RTRCTR C-SECT PINK 25CM LRG (MISCELLANEOUS) IMPLANT
SCRUB PCMX 4 OZ (MISCELLANEOUS) ×2 IMPLANT
STAPLER VISISTAT 35W (STAPLE) IMPLANT
STRIP CLOSURE SKIN 1/2X4 (GAUZE/BANDAGES/DRESSINGS) ×2 IMPLANT
SUT MNCRL 0 VIOLET CTX 36 (SUTURE) ×2 IMPLANT
SUT MNCRL AB 3-0 PS2 27 (SUTURE) IMPLANT
SUT MONOCRYL 0 CTX 36 (SUTURE) ×2
SUT PDS AB 0 CTX 36 PDP370T (SUTURE) ×2 IMPLANT
SUT PLAIN 0 NONE (SUTURE) IMPLANT
SUT VIC AB 0 CTXB 36 (SUTURE) IMPLANT
SUT VIC AB 2-0 CT1 (SUTURE) ×2 IMPLANT
SUT VIC AB 2-0 CT1 27 (SUTURE) ×2
SUT VIC AB 2-0 CT1 TAPERPNT 27 (SUTURE) ×1 IMPLANT
SUT VIC AB 2-0 SH 27 (SUTURE)
SUT VIC AB 2-0 SH 27XBRD (SUTURE) IMPLANT
TOWEL OR 17X24 6PK STRL BLUE (TOWEL DISPOSABLE) ×2 IMPLANT
TRAY FOLEY CATH 14FR (SET/KITS/TRAYS/PACK) ×2 IMPLANT
WATER STERILE IRR 1000ML POUR (IV SOLUTION) ×2 IMPLANT

## 2013-04-20 NOTE — Anesthesia Postprocedure Evaluation (Signed)
  Anesthesia Post-op Note  Anesthesia Post Note  Patient: Sydney Griffin  Procedure(s) Performed: Procedure(s) (LRB): CESAREAN SECTION (N/A)  Anesthesia type: Spinal  Patient location: Mother/Baby  Post pain: Pain level controlled  Post assessment: Post-op Vital signs reviewed  Last Vitals:  Filed Vitals:   04/20/13 1422  BP: 162/97  Pulse: 60  Temp: 36.5 C  Resp: 14    Post vital signs: Reviewed  Level of consciousness: awake  Complications: No apparent anesthesia complications

## 2013-04-20 NOTE — Transfer of Care (Signed)
Immediate Anesthesia Transfer of Care Note  Patient: Sydney Griffin  Procedure(s) Performed: Procedure(s) with comments: CESAREAN SECTION (N/A) - Repeat Cesarean Section   Patient Location: PACU  Anesthesia Type:Regional  Level of Consciousness: awake, alert , oriented and patient cooperative  Airway & Oxygen Therapy: Patient Spontanous Breathing  Post-op Assessment: Report given to PACU RN and Post -op Vital signs reviewed and stable  Post vital signs: stable  Complications: No apparent anesthesia complications

## 2013-04-20 NOTE — Progress Notes (Signed)
UR completed 

## 2013-04-20 NOTE — Anesthesia Procedure Notes (Signed)
Spinal  Patient location during procedure: OR Start time: 04/20/2013 9:01 AM Staffing Performed by: anesthesiologist  Preanesthetic Checklist Completed: patient identified, site marked, surgical consent, pre-op evaluation, timeout performed, IV checked, risks and benefits discussed and monitors and equipment checked Spinal Block Patient position: sitting Prep: site prepped and draped and DuraPrep Patient monitoring: cardiac monitor, continuous pulse ox, blood pressure and heart rate Approach: midline Location: L3-4 Injection technique: catheter Needle Needle type: Tuohy and Pencan  Needle gauge: 24 G Needle length: 12.7 cm Needle insertion depth: 8 cm Catheter type: closed end flexible Catheter size: 19 g Catheter at skin depth: 14 cm Assessment Sensory level: T4 Additional Notes CSE.  LOR to air with tuohy on first attempt.  Pencan via tuohy with immediate return of clear free flow CSF, dose given.  Pencan removed, tuohy flushed with 2 ml saline and epidural catheter placed easily.  Epidural catheter NOT TESTED.  Patient tolerated procedure well with no apparent complications.  Jasmine December, MD

## 2013-04-20 NOTE — Interval H&P Note (Signed)
History and Physical Interval Note:  04/20/2013 8:14 AM  Sydney Griffin  has presented today for surgery, with the diagnosis of IUD @ 86 weeks/ h/o previous C- section [phone]  The various methods of treatment have been discussed with the patient and family. After consideration of risks, benefits and other options for treatment, the patient has consented to  Procedure(s): CESAREAN SECTION (N/A) as a surgical intervention .  The patient's history has been reviewed, patient examined, no change in status, stable for surgery.  I have reviewed the patient's chart and labs.  Questions were answered to the patient's satisfaction.     JACKSON-MOORE,Rutha Melgoza A

## 2013-04-20 NOTE — Op Note (Addendum)
Sydney Section Procedure Note   [name]   04/20/2013  Indications: Previous Sydney delivery, elective repeat   Pre-operative Diagnosis: Intrauterine pregnancy @ 39 weeks/ h/o previous C- section [phone].   Post-operative Diagnosis: Same   Surgeon: [name]  Assistants: Coral Ceo  Anesthesia: spinal  Procedure Details:  The patient was seen in the Holding Room. The risks, benefits, complications, treatment options, and expected outcomes were discussed with the patient. The patient concurred with the proposed plan, giving informed consent. The patient was identified as [name] and the procedure verified as C-Section Delivery. A Time Out was held and the above information confirmed.  After induction of anesthesia, the patient was draped and prepped in the usual sterile manner. A transverse incision was made and carried down through the subcutaneous tissue to the fascia. The fascial incision was made and extended transversely. The fascia was separated from the underlying rectus tissue superiorly. The peritoneum was identified and entered. The peritoneal incision was extended longitudinally. The utero-vesical peritoneal reflection was incised transversely and the bladder flap was sharply freed from the lower uterine segment. A low transverse uterine incision was made. Delivered from cephalic presentation was a living newborn female infant. APGAR (1 MIN): 8   APGAR (5 MINS): 8   A cord ph was not sent. The umbilical cord was clamped and cut.  The placenta was removed Intact and appeared normal.  The uterine incision was closed with running locked sutures of 1-0 Monocryl. A second imbricating layer of the same suture was placed.  Hemostasis was observed.  The parieto peritoneum was closed in a running fashion with 2-0 Vicryl.  The fascia was then reapproximated with running sutures of 0 PDS. A running suture of 2-0 Vicryl was placed in the subcutaneous fat  layer. The skin was closed with suture. A negative pressure dressing was applied.  Instrument, sponge, and needle counts were correct prior the abdominal closure and were correct at the conclusion of the case.    Findings: See above   Estimated Blood Loss: 600 ml  Total IV Fluids: per Anesthesiology   Urine Output: per Anesthesiology  Specimens: Placenta to Pathology  Complications: no complications  Disposition: PACU - hemodynamically stable.  Maternal Condition: stable   Baby condition / location:  nursery-stable    Signed: Surgeon(s): Antionette Char, MD

## 2013-04-20 NOTE — Anesthesia Preprocedure Evaluation (Addendum)
Anesthesia Evaluation  Patient identified by MRN, date of birth, ID band Patient awake    Reviewed: Allergy & Precautions, H&P , NPO status , Patient's Chart, lab work & pertinent test results, reviewed documented beta blocker date and time   History of Anesthesia Complications Negative for: history of anesthetic complications  Airway Mallampati: II TM Distance: >3 FB Neck ROM: full    Dental  (+) Teeth Intact   Pulmonary Current Smoker,  breath sounds clear to auscultation        Cardiovascular hypertension (amlodipine and labetalol), On Medications and On Home Beta Blockers Rhythm:regular Rate:Normal     Neuro/Psych  Headaches, negative psych ROS   GI/Hepatic Neg liver ROS, GERD-  Medicated,  Endo/Other  Morbid obesity  Renal/GU negative Renal ROS  negative genitourinary   Musculoskeletal   Abdominal   Peds  Hematology negative hematology ROS (+)   Anesthesia Other Findings Allergic to ASA, ibuprofen, tramadol, vicodin and dilaudid.  Can take percocet and had fentanyl/duramorph in spinal with last C/S.  Reproductive/Obstetrics (+) Pregnancy (h/o c/s x2, for repeat)                          Anesthesia Physical Anesthesia Plan  ASA: III  Anesthesia Plan: Combined Spinal and Epidural   Post-op Pain Management:    Induction:   Airway Management Planned:   Additional Equipment:   Intra-op Plan:   Post-operative Plan:   Informed Consent: I have reviewed the patients History and Physical, chart, labs and discussed the procedure including the risks, benefits and alternatives for the proposed anesthesia with the patient or authorized representative who has indicated his/her understanding and acceptance.     Plan Discussed with: Surgeon and CRNA  Anesthesia Plan Comments:         Anesthesia Quick Evaluation

## 2013-04-20 NOTE — Anesthesia Postprocedure Evaluation (Signed)
  Anesthesia Post Note  Patient: Sydney Griffin  Procedure(s) Performed: Procedure(s) (LRB): CESAREAN SECTION (N/A)  Anesthesia type: Spinal with epidural  Patient location: PACU  Post pain: Pain level controlled  Post assessment: Post-op Vital signs reviewed  Last Vitals:  Filed Vitals:   04/20/13 1100  BP:   Pulse: 61  Temp:   Resp: 17    Post vital signs: Reviewed  Level of consciousness: awake  Complications: No apparent anesthesia complications

## 2013-04-21 ENCOUNTER — Encounter (HOSPITAL_COMMUNITY): Payer: Self-pay | Admitting: Obstetrics & Gynecology

## 2013-04-21 LAB — CBC
HCT: 31.3 % — ABNORMAL LOW (ref 36.0–46.0)
MCHC: 34.5 g/dL (ref 30.0–36.0)
MCV: 92.1 fL (ref 78.0–100.0)
Platelets: 155 10*3/uL (ref 150–400)
RDW: 13.5 % (ref 11.5–15.5)
WBC: 11.2 10*3/uL — ABNORMAL HIGH (ref 4.0–10.5)

## 2013-04-21 LAB — CCBB MATERNAL DONOR DRAW

## 2013-04-21 MED ORDER — PNEUMOCOCCAL VAC POLYVALENT 25 MCG/0.5ML IJ INJ
0.5000 mL | INJECTION | Freq: Once | INTRAMUSCULAR | Status: AC
  Administered 2013-04-21: 0.5 mL via INTRAMUSCULAR
  Filled 2013-04-21: qty 0.5

## 2013-04-21 MED ORDER — MAGNESIUM SULFATE BOLUS VIA INFUSION
4.0000 g | Freq: Once | INTRAVENOUS | Status: AC
  Administered 2013-04-21: 4 g via INTRAVENOUS
  Filled 2013-04-21: qty 500

## 2013-04-21 MED ORDER — HYDRALAZINE HCL 20 MG/ML IJ SOLN
10.0000 mg | INTRAMUSCULAR | Status: DC | PRN
  Administered 2013-04-21: 10 mg via INTRAVENOUS
  Administered 2013-04-24: 06:00:00 via INTRAVENOUS
  Filled 2013-04-21 (×3): qty 1

## 2013-04-21 MED ORDER — AMLODIPINE BESYLATE 5 MG PO TABS
5.0000 mg | ORAL_TABLET | Freq: Every day | ORAL | Status: DC
  Administered 2013-04-21: 5 mg via ORAL
  Filled 2013-04-21 (×2): qty 1

## 2013-04-21 MED ORDER — LABETALOL HCL 200 MG PO TABS
200.0000 mg | ORAL_TABLET | Freq: Three times a day (TID) | ORAL | Status: DC
  Administered 2013-04-21 (×2): 200 mg via ORAL
  Filled 2013-04-21 (×2): qty 1

## 2013-04-21 MED ORDER — HYDRALAZINE HCL 20 MG/ML IJ SOLN
10.0000 mg | Freq: Once | INTRAMUSCULAR | Status: AC
  Administered 2013-04-21: 10 mg via INTRAVENOUS
  Filled 2013-04-21: qty 1

## 2013-04-21 MED ORDER — LABETALOL HCL 200 MG PO TABS
200.0000 mg | ORAL_TABLET | Freq: Once | ORAL | Status: AC
  Administered 2013-04-21: 200 mg via ORAL
  Filled 2013-04-21: qty 1

## 2013-04-21 MED ORDER — AMLODIPINE BESYLATE 5 MG PO TABS
5.0000 mg | ORAL_TABLET | Freq: Once | ORAL | Status: AC
  Administered 2013-04-21: 5 mg via ORAL
  Filled 2013-04-21: qty 1

## 2013-04-21 MED ORDER — AMLODIPINE BESYLATE 10 MG PO TABS
10.0000 mg | ORAL_TABLET | Freq: Every day | ORAL | Status: DC
  Administered 2013-04-22: 10 mg via ORAL
  Filled 2013-04-21 (×2): qty 1

## 2013-04-21 MED ORDER — MAGNESIUM SULFATE 40 MG/ML IJ SOLN: 4.0000 g | Freq: Once | INTRAMUSCULAR | Status: DC

## 2013-04-21 MED ORDER — LABETALOL HCL 5 MG/ML IV SOLN
20.0000 mg | Freq: Once | INTRAVENOUS | Status: AC
  Administered 2013-04-21: 20 mg via INTRAVENOUS
  Filled 2013-04-21: qty 4

## 2013-04-21 MED ORDER — LABETALOL HCL 200 MG PO TABS
400.0000 mg | ORAL_TABLET | Freq: Three times a day (TID) | ORAL | Status: DC
  Administered 2013-04-21 – 2013-04-23 (×6): 400 mg via ORAL
  Filled 2013-04-21 (×9): qty 2

## 2013-04-21 MED ORDER — OXYCODONE HCL 5 MG PO TABS
10.0000 mg | ORAL_TABLET | Freq: Once | ORAL | Status: DC
  Filled 2013-04-21: qty 2

## 2013-04-21 MED ORDER — MAGNESIUM SULFATE 40 G IN LACTATED RINGERS - SIMPLE
2.0000 g/h | INTRAVENOUS | Status: AC
  Administered 2013-04-21: 2 g/h via INTRAVENOUS
  Filled 2013-04-21: qty 500

## 2013-04-21 NOTE — Progress Notes (Signed)
Spoke with MD about patients blood pressure. Orders received to transfer to AICU

## 2013-04-21 NOTE — Progress Notes (Signed)
MD notified of BP 170/100.  Orders received.

## 2013-04-21 NOTE — Progress Notes (Signed)
MD called about BP. Orders received

## 2013-04-21 NOTE — Progress Notes (Signed)
MD notified of 173/124 blood pressure.  MD requests that patient do not smoke and orders received for medication now.

## 2013-04-21 NOTE — Progress Notes (Signed)
Subjective: Postpartum Day 1: Cesarean Delivery Patient reports tolerating PO.    Objective: Vital signs in last 24 hours: Temp:  [97.3 F (36.3 C)-98.5 F (36.9 C)] 98.5 F (36.9 C) (08/15 0530) Pulse Rate:  [59-87] 87 (08/15 0530) Resp:  [14-20] 16 (08/15 0530) BP: (131-196)/(82-113) 154/84 mmHg (08/15 0530) SpO2:  [97 %-100 %] 98 % (08/15 0530) Weight:  [329 lb (149.233 kg)] 329 lb (149.233 kg) (08/14 1045)  Physical Exam:  General: alert and no distress Lochia: appropriate Uterine Fundus: firm Incision: healing well DVT Evaluation: No evidence of DVT seen on physical exam.   Recent Labs  04/20/13 0758 04/21/13 0555  HGB 11.0* 10.8*  HCT 31.4* 31.3*    Assessment/Plan: Status post Cesarean section. Doing well postoperatively.  Continue current care.  Sydney Griffin A 04/21/2013, 8:42 AM

## 2013-04-21 NOTE — Progress Notes (Signed)
RN received order for Oxycodone 5mg  d/t patient exceeding 4 gram tylenol dose in 24 hours.  Patient refused oxycodone, stating "it gives me a headache, I want to take percocet."  RN informed patient percocet could not be taken again until after 2:00pm.  Patient requests to wait until 2pm for pain medication.

## 2013-04-21 NOTE — Progress Notes (Signed)
MD notified on BP 173/109.  Orders received.

## 2013-04-22 MED ORDER — BUTALBITAL-APAP-CAFFEINE 50-325-40 MG PO TABS
2.0000 | ORAL_TABLET | Freq: Four times a day (QID) | ORAL | Status: DC | PRN
  Administered 2013-04-22 (×2): 2 via ORAL
  Administered 2013-04-23: 1 via ORAL
  Administered 2013-04-23: 2 via ORAL
  Filled 2013-04-22 (×4): qty 2

## 2013-04-22 MED ORDER — HYDROCHLOROTHIAZIDE 25 MG PO TABS
25.0000 mg | ORAL_TABLET | Freq: Every day | ORAL | Status: DC
  Administered 2013-04-22: 25 mg via ORAL
  Filled 2013-04-22 (×2): qty 1

## 2013-04-22 NOTE — Progress Notes (Signed)
Pt refuses to keep BP and pulse ox on

## 2013-04-22 NOTE — Progress Notes (Signed)
Patient ID: Sydney Griffin, female   DOB: Feb 11, 1981, 33 y.o.   MRN: [id number] Postop day 2 Blood pressures much bedrest she's been transferred to the ICU and start restarted on magnesium sulfate 2 g per hour magnesium was discontinued tonight and blood pressures observed and

## 2013-04-23 MED ORDER — CLONIDINE HCL 0.1 MG PO TABS
0.1000 mg | ORAL_TABLET | Freq: Two times a day (BID) | ORAL | Status: DC
  Administered 2013-04-23 – 2013-04-24 (×3): 0.1 mg via ORAL
  Filled 2013-04-23 (×3): qty 1

## 2013-04-23 MED ORDER — LABETALOL HCL 200 MG PO TABS
400.0000 mg | ORAL_TABLET | Freq: Three times a day (TID) | ORAL | Status: DC
  Administered 2013-04-23 – 2013-04-25 (×6): 400 mg via ORAL
  Filled 2013-04-23 (×6): qty 2

## 2013-04-23 NOTE — Anesthesia Postprocedure Evaluation (Signed)
Anesthesia Post Note  Patient: Sydney Griffin  Procedure(s) Performed: Procedure(s) (LRB): CESAREAN SECTION (N/A)  Anesthesia type: Epidural  Patient location: Mother/Baby  Post pain: Pain level controlled  Post assessment: Post-op Vital signs reviewed  Last Vitals:  Filed Vitals:   04/23/13 0846  BP: 144/95  Pulse:   Temp:   Resp:     Post vital signs: Reviewed  Level of consciousness:alert  Complications: No apparent anesthesia complications

## 2013-04-23 NOTE — Progress Notes (Signed)
Patient ID: Sydney Griffin, female   DOB: November 13, 1980, 32 y.o.   MRN: [id number] Postop day 3 Blood pressures remain elevated Magnesium sulfate was discontinued last night Medicines a him adjusted today labetalol 400 mg every 8 hours and she was started on clonidine 0 point one by mouth twice a day the hydrochlorothiazide and Norvasc were discontinued and

## 2013-04-24 ENCOUNTER — Other Ambulatory Visit: Payer: Self-pay | Admitting: Obstetrics

## 2013-04-24 DIAGNOSIS — I10 Essential (primary) hypertension: Secondary | ICD-10-CM

## 2013-04-24 MED ORDER — TRIAMTERENE-HCTZ 37.5-25 MG PO CAPS: 1.0000 | ORAL_CAPSULE | ORAL | Status: DC

## 2013-04-24 MED ORDER — AMLODIPINE BESYLATE 10 MG PO TABS
10.0000 mg | ORAL_TABLET | Freq: Every day | ORAL | Status: DC
  Administered 2013-04-24 – 2013-04-25 (×2): 10 mg via ORAL
  Filled 2013-04-24 (×2): qty 1

## 2013-04-24 MED ORDER — AMLODIPINE BESYLATE 5 MG PO TABS: 5.0000 mg | ORAL_TABLET | Freq: Every day | ORAL | Status: DC

## 2013-04-24 MED ORDER — TRIAMTERENE-HCTZ 37.5-25 MG PO TABS
1.0000 | ORAL_TABLET | Freq: Every day | ORAL | Status: DC
  Administered 2013-04-24 – 2013-04-25 (×2): 1 via ORAL
  Filled 2013-04-24 (×2): qty 1

## 2013-04-24 MED ORDER — PANTOPRAZOLE SODIUM 40 MG PO TBEC
40.0000 mg | DELAYED_RELEASE_TABLET | Freq: Two times a day (BID) | ORAL | Status: DC
  Administered 2013-04-25: 40 mg via ORAL
  Filled 2013-04-24 (×2): qty 1

## 2013-04-24 MED ORDER — HYDRALAZINE HCL 20 MG/ML IJ SOLN
10.0000 mg | Freq: Once | INTRAMUSCULAR | Status: AC
  Administered 2013-04-24: 10 mg via INTRAVENOUS

## 2013-04-24 NOTE — Progress Notes (Signed)
After second dose of Apresoline 10 mg given.

## 2013-04-24 NOTE — Progress Notes (Signed)
Subjective: Postpartum Day 4: Cesarean Delivery Patient reports no complaints.  Objective: Vital signs in last 24 hours: Temp:  [97.6 F (36.4 C)-98.5 F (36.9 C)] 98.1 F (36.7 C) (08/18 0825) Pulse Rate:  [64-83] 70 (08/18 1100) Resp:  [18-20] 18 (08/18 1100) BP: (128-178)/(80-108) 155/104 mmHg (08/18 1100) SpO2:  [97 %] 97 % (08/17 1435)  Physical Exam:  General: alert and no distress Lochia: appropriate Uterine Fundus: firm Incision: healing well DVT Evaluation: No evidence of DVT seen on physical exam.  No results found for this basename: HGB, HCT,  in the last 72 hours  Assessment/Plan: Status post Cesarean section. Postoperative course complicated by elevated BP.    Will adjust BP meds:  D/C clonidine and add Norvasc and HCTZ.  May need intermittent Hydralazine.  HARPER,CHARLES A 04/24/2013, 2:23 PM

## 2013-04-25 ENCOUNTER — Other Ambulatory Visit: Payer: Self-pay | Admitting: Obstetrics

## 2013-04-25 DIAGNOSIS — R52 Pain, unspecified: Secondary | ICD-10-CM

## 2013-04-25 MED ORDER — AMLODIPINE BESYLATE 10 MG PO TABS: 10.0000 mg | ORAL_TABLET | Freq: Every day | ORAL | Status: DC

## 2013-04-25 MED ORDER — OXYCODONE-ACETAMINOPHEN 10-325 MG PO TABS: 1.0000 | ORAL_TABLET | ORAL | Status: DC | PRN

## 2013-04-25 MED ORDER — TRIAMTERENE-HCTZ 37.5-25 MG PO TABS: 1.0000 | ORAL_TABLET | Freq: Every day | ORAL | Status: DC

## 2013-04-25 MED ORDER — LABETALOL HCL 200 MG PO TABS: 400.0000 mg | ORAL_TABLET | Freq: Three times a day (TID) | ORAL | Status: DC

## 2013-04-25 MED ORDER — OXYCODONE HCL 10 MG PO TABS: 10.0000 mg | ORAL_TABLET | Freq: Four times a day (QID) | ORAL | Status: DC | PRN

## 2013-04-25 NOTE — Progress Notes (Signed)
Subjective: Postpartum Day 5: Cesarean Delivery Patient reports no complaints.  Objective: Vital signs in last 24 hours: Temp:  [97.4 F (36.3 C)-98.3 F (36.8 C)] 97.4 F (36.3 C) (08/19 0518) Pulse Rate:  [70-83] 77 (08/19 0518) Resp:  [18-20] 20 (08/19 0518) BP: (132-174)/(84-104) 154/94 mmHg (08/19 0518) SpO2:  [99 %] 99 % (08/19 0214)  Physical Exam:  General: alert and no distress Lochia: appropriate Uterine Fundus: firm Incision: healing well DVT Evaluation: No evidence of DVT seen on physical exam.  No results found for this basename: HGB, HCT,  in the last 72 hours  Assessment/Plan: Status post Cesarean section. Doing well postoperatively.   HTN.  Stable. Discharge home with standard precautions and return to clinic in 2 weeks.  HARPER,CHARLES A 04/25/2013, 6:35 AM

## 2013-04-25 NOTE — Discharge Summary (Signed)
Obstetric Discharge Summary Reason for Admission: cesarean section Prenatal Procedures: NST and ultrasound Intrapartum Procedures: cesarean: low cervical, transverse Postpartum Procedures: Antihypertensive treatment Complications-Operative and Postpartum: HTN Hemoglobin  Date Value Range Status  04/21/2013 10.8* 12.0 - 15.0 g/dL Final  04/07/1913 78.2   Final     HCT  Date Value Range Status  04/21/2013 31.3* 36.0 - 46.0 % Final  10/14/2011 37   Final    Physical Exam:  General: alert and no distress Lochia: appropriate Uterine Fundus: firm Incision: healing well DVT Evaluation: No evidence of DVT seen on physical exam.  Discharge Diagnoses: Term Pregnancy-delivered and Hypertension  Discharge Information: Date: 04/25/2013 Activity: pelvic rest Diet: routine Medications: PNV, Colace, Percocet and Dyazide, Norvasc and Labetalol. Condition: stable Instructions: refer to practice specific booklet Discharge to: home Follow-up Information   Follow up with [name], MD In 2 weeks.   Specialty:  Obstetrics and Gynecology   Contact information:   [address] [place] [address] [phone]       Newborn Data: Live born female  Birth Weight: 7 lb 9 oz (3430 g) APGAR: 8, 8  Home with mother.  Sydney Griffin,Sydney Griffin 04/25/2013, 7:04 AM

## 2013-04-25 NOTE — Discharge Summary (Signed)
Obstetric Discharge Summary Reason for Admission: cesarean section Prenatal Procedures: ultrasound Intrapartum Procedures: cesarean: low cervical, transverse Postpartum Procedures: none Complications-Operative and Postpartum: HTN Hemoglobin  Date Value Range Status  04/21/2013 10.8* 12.0 - 15.0 g/dL Final  09/12/1094 04.5   Final     HCT  Date Value Range Status  04/21/2013 31.3* 36.0 - 46.0 % Final  10/14/2011 37   Final    Physical Exam:  General: alert and no distress Lochia: appropriate Uterine Fundus: firm Incision: healing well DVT Evaluation: No evidence of DVT seen on physical exam.  Discharge Diagnoses: Term Pregnancy-delivered and HTN  Discharge Information: Date: 04/25/2013 Activity: pelvic rest Diet: routine Medications: PNV, Colace, Percocet and Dyazide, Norvasc and labetalol. Condition: stable Instructions: refer to practice specific booklet Discharge to: home Follow-up Information   Follow up with [name], MD In 2 weeks.   Specialty:  Obstetrics and Gynecology   Contact information:   [address] [place] [address] [phone]       Newborn Data: Live born female  Birth Weight: 7 lb 9 oz (3430 g) APGAR: 8, 8  Home with mother.  Sydney Griffin 04/25/2013, 7:00 AM

## 2013-05-03 ENCOUNTER — Ambulatory Visit: Payer: Medicaid Other | Admitting: Obstetrics & Gynecology

## 2013-05-09 ENCOUNTER — Encounter: Payer: Self-pay | Admitting: Obstetrics

## 2013-05-09 ENCOUNTER — Ambulatory Visit (INDEPENDENT_AMBULATORY_CARE_PROVIDER_SITE_OTHER): Payer: Medicaid Other | Admitting: Obstetrics

## 2013-05-09 ENCOUNTER — Ambulatory Visit: Payer: Medicaid Other

## 2013-05-09 DIAGNOSIS — R52 Pain, unspecified: Secondary | ICD-10-CM

## 2013-05-09 DIAGNOSIS — Z3009 Encounter for other general counseling and advice on contraception: Secondary | ICD-10-CM

## 2013-05-09 MED ORDER — OXYCODONE-ACETAMINOPHEN 10-325 MG PO TABS: 1.0000 | ORAL_TABLET | ORAL | Status: DC | PRN

## 2013-05-09 NOTE — Progress Notes (Signed)
Subjective:     Sydney Griffin is a 32 y.o. female who presents for a postpartum visit. She is 2 weeks postpartum following a low cervical transverse Cesarean section. I have fully reviewed the prenatal and intrapartum course. The delivery was at 39 gestational weeks. Outcome: repeat cesarean section, low transverse incision. Anesthesia: spinal. Postpartum course has been normal. Baby's course has been normal. Baby is feeding by bottle - gerber gentle. Bleeding moderate lochia. Bowel function is normal. Bladder function is normal. Patient is not sexually active. Contraception method is none. Postpartum depression screening: negative.  The following portions of the patient's history were reviewed and updated as appropriate: allergies, current medications, past family history, past medical history, past social history, past surgical history and problem list.  Review of Systems Pertinent items are noted in HPI.   Objective:    BP 130/83  Pulse 86  Temp(Src) 98.8 F (37.1 C)  Wt 293 lb (132.904 kg)  BMI 43.25 kg/m2  Breastfeeding? No   Abdomen:  Soft, NT.  Incision C, D, I.                                      Assessment:     Normal postpartum exam. Pap smear not done at today's visit.   Plan:    1. Contraception: none 2.  Contraceptive options discussed. 3. Follow up in: 4 weeks or as needed.

## 2013-05-11 ENCOUNTER — Encounter: Payer: Self-pay | Admitting: Obstetrics

## 2013-05-22 ENCOUNTER — Ambulatory Visit: Payer: Self-pay | Admitting: Obstetrics & Gynecology

## 2013-05-22 ENCOUNTER — Encounter: Payer: Self-pay | Admitting: Obstetrics & Gynecology

## 2013-05-22 ENCOUNTER — Ambulatory Visit (INDEPENDENT_AMBULATORY_CARE_PROVIDER_SITE_OTHER): Payer: Medicaid Other | Admitting: Obstetrics & Gynecology

## 2013-05-22 VITALS — BP 121/81 | HR 85 | Temp 98.5°F | Ht 69.0 in | Wt 294.0 lb

## 2013-05-22 DIAGNOSIS — Z30017 Encounter for initial prescription of implantable subdermal contraceptive: Secondary | ICD-10-CM

## 2013-05-22 DIAGNOSIS — IMO0001 Reserved for inherently not codable concepts without codable children: Secondary | ICD-10-CM

## 2013-05-22 DIAGNOSIS — Z3202 Encounter for pregnancy test, result negative: Secondary | ICD-10-CM

## 2013-05-22 LAB — POCT URINE PREGNANCY: Preg Test, Ur: NEGATIVE

## 2013-05-22 MED ORDER — NORETHINDRONE ACETATE 5 MG PO TABS: 10.0000 mg | ORAL_TABLET | Freq: Every day | ORAL | Status: DC

## 2013-05-22 NOTE — Progress Notes (Signed)
LMP- 05/19/13.  Patient states that she has not had unprotected intercourse since her delivery 4 weeks ago.  UPT today is negative, . NEXPLANON INSERTION NOTE    Contraception used: Abstinence    Indications:  The patient desires contraception.  She understands risks, benefits, and alternatives to Implanon and would like to proceed.  Anesthesia:   Lidocaine 1% plain.  Procedure:  A time-out was performed confirming the procedure and the patient's allergy status.  The patient's non-dominant was identified as the left arm.  The protection cap was removed. While placing countertraction on the skin, the needle was inserted at a 30 degree angle.  The applicator was held horizontal to the skin; the skin was tented upward as the needle was introduced into the subdermal space.  While holding the applicator in place, the slider was unlocked. The Nexplanon was removed from the field.  The Nexplanon was palpated to ensure proper placement.  Complications: None  Instructions:  The patient was instructed to remove the dressing in 24 hours and that some bruising is to be expected.  She was advised to use over the counter analgesics as needed for any pain at the site.  She is to keep the area dry for 24 hours and to call if her hand or arm becomes cold, numb, or blue.  Return visit:  Return in 6+ weeks

## 2013-05-22 NOTE — Progress Notes (Deleted)
.   Subjective:     Sydney Griffin is a 32 y.o. female here for a problem exam.  Current complaints: Patient is 4 weeks post partum and started bleeding on Friday (day 4) - she is interested in the Nexplanon.  Patient states that she has not had unprotected intercourse since delivery.  Personal health questionnaire reviewed: {yes/no:9010}.   Gynecologic History Patient's last menstrual period was 05/19/2013. Contraception: none  Obstetric History OB History  Gravida Para Term Preterm AB SAB TAB Ectopic Multiple Living  4 3 3  0 1   1  3     # Outcome Date GA Lbr Len/2nd Weight Sex Delivery Anes PTL Lv  4 TRM 04/20/13 105w0d  7 lb 9 oz (3.43 kg) F LTCS Spinal  Y  3 TRM 03/28/12 [redacted]w[redacted]d  5 lb 10 oz (2.55 kg) F LTCS Spinal  Y  2 TRM 03/08/03 [redacted]w[redacted]d  7 lb 2 oz (3.232 kg) F SVD EPI  Y  1 ECT                {Common ambulatory SmartLinks:19316}  Review of Systems {ros; complete:30496}    Objective:    {exam; complete:18323}    Assessment:    Healthy female exam.    Plan:    {plan:19193}

## 2013-05-29 ENCOUNTER — Ambulatory Visit: Payer: Medicaid Other | Admitting: Obstetrics & Gynecology

## 2013-05-31 ENCOUNTER — Encounter (HOSPITAL_COMMUNITY): Payer: Self-pay | Admitting: *Deleted

## 2013-05-31 ENCOUNTER — Inpatient Hospital Stay (HOSPITAL_COMMUNITY)
Admission: AD | Admit: 2013-05-31 | Discharge: 2013-05-31 | Disposition: A | Discharge status: Home / Self Care | Payer: Medicaid Other | Source: Ambulatory Visit | Attending: Obstetrics & Gynecology | Admitting: Obstetrics & Gynecology

## 2013-05-31 DIAGNOSIS — N939 Abnormal uterine and vaginal bleeding, unspecified: Secondary | ICD-10-CM

## 2013-05-31 DIAGNOSIS — N938 Other specified abnormal uterine and vaginal bleeding: Secondary | ICD-10-CM | POA: Insufficient documentation

## 2013-05-31 DIAGNOSIS — N898 Other specified noninflammatory disorders of vagina: Secondary | ICD-10-CM

## 2013-05-31 DIAGNOSIS — N949 Unspecified condition associated with female genital organs and menstrual cycle: Secondary | ICD-10-CM | POA: Insufficient documentation

## 2013-05-31 LAB — WET PREP, GENITAL
Trich, Wet Prep: NONE SEEN
Yeast Wet Prep HPF POC: NONE SEEN

## 2013-05-31 LAB — CBC
MCH: 30.8 pg (ref 26.0–34.0)
MCHC: 33.9 g/dL (ref 30.0–36.0)
Platelets: 216 10*3/uL (ref 150–400)
RDW: 12.4 % (ref 11.5–15.5)

## 2013-05-31 MED ORDER — OXYCODONE-ACETAMINOPHEN 5-325 MG PO TABS: 2.0000 | ORAL_TABLET | Freq: Once | ORAL | Status: DC

## 2013-05-31 NOTE — MAU Provider Note (Signed)
History     CSN: [id number]  Arrival date and time: 05/31/13 1052   None     Chief Complaint  Patient presents with  . Vaginal Bleeding   HPI Comments: Sydney Griffin 32 y.o. [id number] presents to MAU for heavy menstrual bleeding and cramping after Nexplanon insertion. She was given progestin by Dr Tamela Oddi and took it for 3 days only. She Declines birth control pills today and basically wants more pain medication. She was given #40 percocet by Dr Clearance Coots on 05/09/13 following her C/S. She has taken all of these. She is not able to take any ibuprofen, ultram or vicoden, dilaudid, just percocet.       Vaginal Bleeding Associated symptoms include abdominal pain.      Past Medical History  Diagnosis Date  . Acid reflux   . Menstrual bleeding problem   . Headache(784.0)   . Ectopic pregnancy     Had MTX  . Hypertension   . Asthma May 2013    info from [facility] states asthma but pt denies and said she had bronchitis, used an Hydrologist while sick and has now lost the inhaler.  Marland Kitchen Dysrhythmia     rare palpitations    Past Surgical History  Procedure Laterality Date  . Cesarean section    . Dilation and curettage of uterus    . Laparoscopy    . Cesarean section  03/28/2012    Procedure: CESAREAN SECTION;  Surgeon: Antionette Char, MD;  Location: [facility];  Service: Gynecology;  Laterality: N/A;  . Cesarean section N/A 04/20/2013    Procedure: CESAREAN SECTION;  Surgeon: Antionette Char, MD;  Location: [facility];  Service: Obstetrics;  Laterality: N/A;  Repeat Cesarean Section     Family History  Problem Relation Age of Onset  . Anesthesia problems Neg Hx   . Other Neg Hx   . Hypertension Mother   . Diabetes Mother   . Diabetes Father   . Hypertension Brother     History  Substance Use Topics  . Smoking status: Current Every Day Smoker -- 1.00 packs/day for 14 years    Types: Cigarettes  . Smokeless tobacco: Never Used  . Alcohol Use: No    Allergies:   Allergies  Allergen Reactions  . Aspirin Shortness Of Breath  . Dilaudid [Hydromorphone Hcl] Other (See Comments)    Pt says medication causes her to shake uncontrollably.  . Ibuprofen     Gi upset  . Tramadol Nausea Only and Other (See Comments)    Causes headache.  . Vicodin [Hydrocodone-Acetaminophen] Other (See Comments)    Causes headache.    Prescriptions prior to admission  Medication Sig Dispense Refill  . amLODipine (NORVASC) 10 MG tablet Take 1 tablet (10 mg total) by mouth daily.  30 tablet  1  . esomeprazole (NEXIUM) 40 MG capsule Take 40 mg by mouth daily as needed.       . labetalol (NORMODYNE) 200 MG tablet Take 2 tablets (400 mg total) by mouth every 8 (eight) hours.  60 tablet  1  . norethindrone (AYGESTIN) 5 MG tablet Take 2 tablets (10 mg total) by mouth daily.  60 tablet  2  . oxyCODONE-acetaminophen (PERCOCET) 10-325 MG per tablet Take 1 tablet by mouth every 4 (four) hours as needed for pain.  40 tablet  0  . triamterene-hydrochlorothiazide (DYAZIDE) 50-25 MG per capsule Take 1 capsule by mouth every morning.        Review of Systems  Constitutional: Negative.  HENT: Negative.   Respiratory: Negative.   Cardiovascular: Negative.   Gastrointestinal: Positive for abdominal pain.  Genitourinary: Positive for vaginal bleeding.       Vaginal bleeding and cramping  Musculoskeletal: Negative.   Skin: Negative.   Neurological: Negative.   Psychiatric/Behavioral: Negative.        Crying   Physical Exam   Blood pressure 112/68, pulse 79, temperature 98.6 F (37 C), temperature source Oral, resp. rate 20, last menstrual period 05/19/2013, SpO2 99.00%.  Physical Exam  Constitutional: She appears well-developed and well-nourished. No distress.  HENT:  Head: Normocephalic and atraumatic.  Eyes: Pupils are equal, round, and reactive to light.  GI: Soft. She exhibits no distension and no mass. There is no tenderness. There is no rebound and no guarding.   Genitourinary:  External: Negative Vaginal: Minimal bleeding Cervix: negative Biman: negative  Neurological: She is alert.  Skin: Skin is warm and dry.  Nexplanon in place in left upper arm  Psychiatric:  tearful   Results for orders placed during the hospital encounter of 05/31/13 (from the past 24 hour(s))  CBC     Status: Abnormal   Collection Time    05/31/13 12:21 PM      Result Value Range   WBC 8.0  4.0 - 10.5 K/uL   RBC 3.90  3.87 - 5.11 MIL/uL   Hemoglobin 12.0  12.0 - 15.0 g/dL   HCT 16.1 (*) 09.6 - 04.5 %   MCV 90.8  78.0 - 100.0 fL   MCH 30.8  26.0 - 34.0 pg   MCHC 33.9  30.0 - 36.0 g/dL   RDW 40.9  81.1 - 91.4 %   Platelets 216  150 - 400 K/uL     MAU Course  Procedures  MDM  CBC Percocet # 2 tabs  Assessment and Plan   A: Contraception/ Nexplanon with vaginal bleeding P: Pt declines birth control pills and any for of pain medications other than oxycodone Follow up with Dr Haroldine Laws, Rubbie Battiest 05/31/2013, 12:54 PM

## 2013-05-31 NOTE — MAU Note (Signed)
Bleeding getting steadily heavier, had nexiplon placed 09/15- was bleeding then.

## 2013-06-02 ENCOUNTER — Encounter: Payer: Self-pay | Admitting: Obstetrics & Gynecology

## 2013-06-02 ENCOUNTER — Ambulatory Visit (INDEPENDENT_AMBULATORY_CARE_PROVIDER_SITE_OTHER): Payer: Medicaid Other | Admitting: Obstetrics & Gynecology

## 2013-06-02 VITALS — BP 136/91 | HR 93 | Temp 98.5°F | Ht 69.0 in | Wt 289.6 lb

## 2013-06-02 DIAGNOSIS — N939 Abnormal uterine and vaginal bleeding, unspecified: Secondary | ICD-10-CM

## 2013-06-02 DIAGNOSIS — N926 Irregular menstruation, unspecified: Secondary | ICD-10-CM

## 2013-06-02 MED ORDER — OXYCODONE-ACETAMINOPHEN 5-325 MG PO TABS: 1.0000 | ORAL_TABLET | Freq: Four times a day (QID) | ORAL | Status: DC | PRN

## 2013-06-02 MED ORDER — OXYCODONE HCL 10 MG PO TB12: 10.0000 mg | ORAL_TABLET | Freq: Two times a day (BID) | ORAL | Status: DC | PRN

## 2013-06-02 NOTE — Progress Notes (Signed)
Subjective:     Sydney Griffin is a 32 y.o. female here for a routine exam.  Current complaints: pelvic pain and bleeding. Pt states she is having heavy bleeding and passing nickel to quarter sized clots. Pt states she is having shooting pains in her vagina that are shooting from the front to the back.  Personal health questionnaire reviewed: yes.   Gynecologic History Patient's last menstrual period was 05/19/2013. Contraception: Nexplanon  Obstetric History OB History  Gravida Para Term Preterm AB SAB TAB Ectopic Multiple Living  4 3 3  0 1   1  3     # Outcome Date GA Lbr Len/2nd Weight Sex Delivery Anes PTL Lv  4 TRM 04/20/13 [redacted]w[redacted]d  7 lb 9 oz (3.43 kg) F LTCS Spinal  Y  3 TRM 03/28/12 [redacted]w[redacted]d  5 lb 10 oz (2.55 kg) F LTCS Spinal  Y  2 TRM 03/08/03 [redacted]w[redacted]d  7 lb 2 oz (3.232 kg) F SVD EPI  Y  1 ECT                The following portions of the patient's history were reviewed and updated as appropriate: allergies, current medications, past family history, past medical history, past social history, past surgical history and problem list.  Review of Systems Pertinent items are noted in HPI.    Objective:   SPEC: small heme Bimanual exam:  Assessment:   AUB--now s/p Nexplanon placement Pelvic pain   Plan:   Continue Aygestin Rx given for Percocet--continue to monitor for abuse potential Referral to Behavioral Health Return in 1 mth

## 2013-06-03 LAB — GC/CHLAMYDIA PROBE AMP: GC Probe RNA: NEGATIVE

## 2013-07-03 ENCOUNTER — Ambulatory Visit: Payer: Medicaid Other | Admitting: Obstetrics & Gynecology

## 2013-07-05 ENCOUNTER — Encounter (HOSPITAL_COMMUNITY): Payer: Self-pay | Admitting: Emergency Medicine

## 2013-07-05 ENCOUNTER — Encounter: Payer: Self-pay | Admitting: Obstetrics & Gynecology

## 2013-07-05 ENCOUNTER — Emergency Department (HOSPITAL_COMMUNITY)
Admission: EM | Admit: 2013-07-05 | Discharge: 2013-07-05 | Disposition: A | Discharge status: Home / Self Care | Payer: Medicaid Other | Attending: Emergency Medicine | Admitting: Emergency Medicine

## 2013-07-05 ENCOUNTER — Ambulatory Visit (INDEPENDENT_AMBULATORY_CARE_PROVIDER_SITE_OTHER): Payer: Medicaid Other | Admitting: Obstetrics & Gynecology

## 2013-07-05 DIAGNOSIS — J45909 Unspecified asthma, uncomplicated: Secondary | ICD-10-CM | POA: Insufficient documentation

## 2013-07-05 DIAGNOSIS — K5900 Constipation, unspecified: Secondary | ICD-10-CM | POA: Insufficient documentation

## 2013-07-05 DIAGNOSIS — Z79899 Other long term (current) drug therapy: Secondary | ICD-10-CM | POA: Insufficient documentation

## 2013-07-05 DIAGNOSIS — F17200 Nicotine dependence, unspecified, uncomplicated: Secondary | ICD-10-CM | POA: Insufficient documentation

## 2013-07-05 DIAGNOSIS — Z8742 Personal history of other diseases of the female genital tract: Secondary | ICD-10-CM | POA: Insufficient documentation

## 2013-07-05 DIAGNOSIS — K219 Gastro-esophageal reflux disease without esophagitis: Secondary | ICD-10-CM | POA: Insufficient documentation

## 2013-07-05 DIAGNOSIS — K644 Residual hemorrhoidal skin tags: Secondary | ICD-10-CM | POA: Insufficient documentation

## 2013-07-05 DIAGNOSIS — I10 Essential (primary) hypertension: Secondary | ICD-10-CM | POA: Insufficient documentation

## 2013-07-05 LAB — POCT URINALYSIS DIPSTICK
Blood, UA: NEGATIVE
Nitrite, UA: NEGATIVE
Spec Grav, UA: 1.015
pH, UA: 5

## 2013-07-05 MED ORDER — HYDROCODONE-ACETAMINOPHEN 5-325 MG PO TABS: 2.0000 | ORAL_TABLET | ORAL | Status: DC | PRN

## 2013-07-05 MED ORDER — HYDROCODONE-ACETAMINOPHEN 5-325 MG PO TABS
2.0000 | ORAL_TABLET | Freq: Once | ORAL | Status: DC
  Filled 2013-07-05: qty 2

## 2013-07-05 MED ORDER — POLYETHYLENE GLYCOL 3350 17 G PO PACK: 17.0000 g | PACK | Freq: Every day | ORAL | Status: DC

## 2013-07-05 MED ORDER — OXYCODONE-ACETAMINOPHEN 5-325 MG PO TABS: 2.0000 | ORAL_TABLET | ORAL | Status: DC | PRN

## 2013-07-05 MED ORDER — ONDANSETRON 8 MG PO TBDP
8.0000 mg | ORAL_TABLET | Freq: Once | ORAL | Status: AC
  Administered 2013-07-05: 8 mg via ORAL
  Filled 2013-07-05: qty 1

## 2013-07-05 MED ORDER — OXYCODONE-ACETAMINOPHEN 5-325 MG PO TABS
2.0000 | ORAL_TABLET | Freq: Once | ORAL | Status: AC
  Administered 2013-07-05: 2 via ORAL
  Filled 2013-07-05: qty 2

## 2013-07-05 MED ORDER — HYDROCORTISONE 2.5 % RE CREA: TOPICAL_CREAM | RECTAL | Status: DC

## 2013-07-05 NOTE — ED Notes (Signed)
Pt states started having rectal pain x 3 days ago, states last BM this morning, states she has to strain when using restroom, denies bleeding.

## 2013-07-05 NOTE — Progress Notes (Signed)
Subjective:     Sydney Griffin is a 32 y.o. female who presents for a postpartum visit. She is 8 weeks postpartum following a low cervical transverse Cesarean section. I have fully reviewed the prenatal and intrapartum course. The delivery was at 39 gestational weeks. Outcome: primary cesarean section, low transverse incision and repeat cesarean section, low transverse incision. Anesthesia: spinal. Postpartum course has been pt states she is having pain and pressure in her bottom. Baby's course has been WNL. Baby is feeding by bottle - Gerber Gentle. Bleeding no bleeding. Bowel function is abnormal: some constipation. Bladder function is normal. Patient is not sexually active. Contraception method is abstinence and Nexplanon. Postpartum depression screening: negative.  The following portions of the patient's history were reviewed and updated as appropriate: allergies, current medications, past family history, past medical history, past social history, past surgical history and problem list.  Review of Systems Gastrointestinal: positive for rectal/buttock pain   Objective:    BP 131/84  Pulse 92  Temp(Src) 97.9 F (36.6 C)  Wt 284 lb 12.8 oz (129.184 kg)  BMI 42.04 kg/m2  Breastfeeding? No        General:  alert     Abdomen: soft, non-tender; bowel sounds normal; no masses,  no organomegaly; incision well-healed   Vulva:  normal  Vagina: normal vagina  Cervix:  no lesions  Corpus: normal size, contour, position, consistency, mobility, non-tender  Adnexa:  normal adnexa  Rectum: swollen hemorrhoid Assessment:    Buttock pain--hemorrhoids; ?thrombosis  Plan:   Contraception: Nexplanon Counseled to present to ED with above complaints  Return in a few months or prn

## 2013-07-05 NOTE — Addendum Note (Signed)
Addended by: Henriette Combs on: 07/05/2013 06:19 PM   Modules accepted: Orders

## 2013-07-05 NOTE — ED Provider Notes (Signed)
CSN: [id number]     Arrival date & time 07/05/13  1409 History   First MD Initiated Contact with Patient 07/05/13 1553     Chief Complaint  Patient presents with  . Rectal Pain   (Consider location/radiation/quality/duration/timing/severity/associated sxs/prior Treatment) HPI Comments: Patient is a 32 year old female who presents with a 3 day history of rectal pain. Symptoms started gradually and progressively worsened since the onset. The pain is sharp and severe and does not radiate. Patient reports associated constipation and straining to have a bowel movement. Palpation of the area makes the pain worse. No alleviating factors. No other associated symptoms.    Past Medical History  Diagnosis Date  . Acid reflux   . Menstrual bleeding problem   . Headache(784.0)   . Ectopic pregnancy     Had MTX  . Hypertension   . Asthma May 2013    info from [facility] states asthma but pt denies and said she had bronchitis, used an Hydrologist while sick and has now lost the inhaler.  Marland Kitchen Dysrhythmia     rare palpitations   Past Surgical History  Procedure Laterality Date  . Cesarean section    . Dilation and curettage of uterus    . Laparoscopy    . Cesarean section  03/28/2012    Procedure: CESAREAN SECTION;  Surgeon: Antionette Char, MD;  Location: [facility];  Service: Gynecology;  Laterality: N/A;  . Cesarean section N/A 04/20/2013    Procedure: CESAREAN SECTION;  Surgeon: Antionette Char, MD;  Location: [facility];  Service: Obstetrics;  Laterality: N/A;  Repeat Cesarean Section    Family History  Problem Relation Age of Onset  . Anesthesia problems Neg Hx   . Other Neg Hx   . Hypertension Mother   . Diabetes Mother   . Diabetes Father   . Hypertension Brother    History  Substance Use Topics  . Smoking status: Current Every Day Smoker -- 1.00 packs/day for 14 years    Types: Cigarettes  . Smokeless tobacco: Never Used  . Alcohol Use: No   OB History   Grav Para Term Preterm Abortions  TAB SAB Ect Mult Living   4 3 3  0 1   1  3      Review of Systems  Gastrointestinal: Positive for rectal pain.  All other systems reviewed and are negative.    Allergies  Aspirin; Dilaudid; Ibuprofen; Tramadol; and Vicodin  Home Medications   Current Outpatient Rx  Name  Route  Sig  Dispense  Refill  . acetaminophen (TYLENOL) 325 MG tablet   Oral   Take 650 mg by mouth every 6 (six) hours as needed for pain.         Marland Kitchen amLODipine (NORVASC) 10 MG tablet   Oral   Take 1 tablet (10 mg total) by mouth daily.   30 tablet   1   . esomeprazole (NEXIUM) 40 MG capsule   Oral   Take 40 mg by mouth daily as needed.          . labetalol (NORMODYNE) 200 MG tablet   Oral   Take 2 tablets (400 mg total) by mouth every 8 (eight) hours.   60 tablet   1   . norethindrone (AYGESTIN) 5 MG tablet   Oral   Take 2 tablets (10 mg total) by mouth daily.   60 tablet   2   . oxyCODONE-acetaminophen (PERCOCET) 5-325 MG per tablet   Oral   Take 1 tablet  by mouth every 6 (six) hours as needed for pain.   120 tablet   0   . triamterene-hydrochlorothiazide (DYAZIDE) 50-25 MG per capsule   Oral   Take 1 capsule by mouth every morning.          BP 135/72  Pulse 83  Temp(Src) 98.2 F (36.8 C) (Oral)  Resp 18  SpO2 100%  LMP 05/30/2013 Physical Exam  Nursing note and vitals reviewed. Constitutional: She is oriented to person, place, and time. She appears well-developed and well-nourished. No distress.  HENT:  Head: Normocephalic and atraumatic.  Eyes: Conjunctivae are normal.  Neck: Normal range of motion.  Cardiovascular: Normal rate and regular rhythm.  Exam reveals no gallop and no friction rub.   No murmur heard. Pulmonary/Chest: Effort normal and breath sounds normal. She has no wheezes. She has no rales. She exhibits no tenderness.  Abdominal: Soft. She exhibits no distension. There is no tenderness. There is no rebound and no guarding.  Genitourinary:  2 external  hemorrhoids noted on rectal exam at 2 o'clock and 5 o'clock position. The hemorrhoids are tender to palpation and do not appear thrombosed.   Musculoskeletal: Normal range of motion.  Neurological: She is alert and oriented to person, place, and time. Coordination normal.  Speech is goal-oriented. Moves limbs without ataxia.   Skin: Skin is warm and dry.  Psychiatric: She has a normal mood and affect. Her behavior is normal.    ED Course  Procedures (including critical care time) Labs Review Labs Reviewed - No data to display Imaging Review No results found.  EKG Interpretation   None       MDM   1. External hemorrhoids     4:15 PM Patient has external hemorrhoids that are tender to palpation. Patient will have vicodin and zofran for pain. Patient will have anusol and stool softener. Vitals stable and patient afebrile. No further evaluation needed at this time.     Emilia Beck, PA-C 07/05/13 2228

## 2013-07-05 NOTE — ED Provider Notes (Signed)
Medical screening examination/treatment/procedure(s) were performed by non-physician practitioner and as supervising physician I was immediately available for consultation/collaboration.  EKG Interpretation   None         David H Yao, MD 07/05/13 2300 

## 2013-07-05 NOTE — Addendum Note (Signed)
Addended by: Henriette Combs on: 07/05/2013 03:01 PM   Modules accepted: Orders

## 2013-07-07 LAB — PAP IG W/ RFLX HPV ASCU

## 2013-07-11 ENCOUNTER — Encounter: Payer: Self-pay | Admitting: Obstetrics & Gynecology

## 2013-07-13 ENCOUNTER — Other Ambulatory Visit: Payer: Self-pay

## 2013-07-25 ENCOUNTER — Encounter: Payer: Self-pay | Admitting: Obstetrics & Gynecology

## 2013-08-07 ENCOUNTER — Ambulatory Visit: Payer: Medicaid Other | Admitting: Obstetrics & Gynecology

## 2014-03-12 ENCOUNTER — Emergency Department (HOSPITAL_COMMUNITY)
Admission: EM | Admit: 2014-03-12 | Discharge: 2014-03-13 | Disposition: A | Discharge status: Home / Self Care | Payer: Medicaid Other | Attending: Emergency Medicine | Admitting: Emergency Medicine

## 2014-03-12 ENCOUNTER — Emergency Department (HOSPITAL_COMMUNITY): Payer: Medicaid Other

## 2014-03-12 ENCOUNTER — Encounter (HOSPITAL_COMMUNITY): Payer: Self-pay | Admitting: Emergency Medicine

## 2014-03-12 DIAGNOSIS — I10 Essential (primary) hypertension: Secondary | ICD-10-CM | POA: Insufficient documentation

## 2014-03-12 DIAGNOSIS — Z79899 Other long term (current) drug therapy: Secondary | ICD-10-CM | POA: Diagnosis not present

## 2014-03-12 DIAGNOSIS — IMO0001 Reserved for inherently not codable concepts without codable children: Secondary | ICD-10-CM

## 2014-03-12 DIAGNOSIS — Z8742 Personal history of other diseases of the female genital tract: Secondary | ICD-10-CM | POA: Diagnosis not present

## 2014-03-12 DIAGNOSIS — J45901 Unspecified asthma with (acute) exacerbation: Secondary | ICD-10-CM | POA: Insufficient documentation

## 2014-03-12 DIAGNOSIS — G8929 Other chronic pain: Secondary | ICD-10-CM | POA: Diagnosis not present

## 2014-03-12 DIAGNOSIS — F17200 Nicotine dependence, unspecified, uncomplicated: Secondary | ICD-10-CM | POA: Diagnosis not present

## 2014-03-12 DIAGNOSIS — K219 Gastro-esophageal reflux disease without esophagitis: Secondary | ICD-10-CM | POA: Insufficient documentation

## 2014-03-12 DIAGNOSIS — F411 Generalized anxiety disorder: Secondary | ICD-10-CM | POA: Diagnosis not present

## 2014-03-12 DIAGNOSIS — R079 Chest pain, unspecified: Secondary | ICD-10-CM | POA: Diagnosis present

## 2014-03-12 LAB — CBC
HEMATOCRIT: 36.5 % (ref 36.0–46.0)
Hemoglobin: 12.5 g/dL (ref 12.0–15.0)
MCH: 31.6 pg (ref 26.0–34.0)
MCHC: 34.2 g/dL (ref 30.0–36.0)
MCV: 92.4 fL (ref 78.0–100.0)
PLATELETS: 212 10*3/uL (ref 150–400)
RBC: 3.95 MIL/uL (ref 3.87–5.11)
RDW: 12.8 % (ref 11.5–15.5)
WBC: 8.7 10*3/uL (ref 4.0–10.5)

## 2014-03-12 LAB — BASIC METABOLIC PANEL
ANION GAP: 16 — AB (ref 5–15)
BUN: 10 mg/dL (ref 6–23)
CO2: 23 mEq/L (ref 19–32)
CREATININE: 0.92 mg/dL (ref 0.50–1.10)
Calcium: 8.9 mg/dL (ref 8.4–10.5)
Chloride: 100 mEq/L (ref 96–112)
GFR calc Af Amer: >90 mL/min (ref >90)
GFR, EST NON AFRICAN AMERICAN: 83 mL/min — AB (ref >90)
Glucose, Bld: 134 mg/dL — ABNORMAL HIGH (ref 70–99)
Potassium: 3.3 mEq/L — ABNORMAL LOW (ref 3.7–5.3)
Sodium: 139 mEq/L (ref 137–147)

## 2014-03-12 LAB — D-DIMER, QUANTITATIVE: D-Dimer, Quant: 0.28 ug/mL-FEU (ref 0.00–0.48)

## 2014-03-12 LAB — PRO B NATRIURETIC PEPTIDE: Pro B Natriuretic peptide (BNP): 71.4 pg/mL (ref 0–125)

## 2014-03-12 LAB — I-STAT TROPONIN, ED: Troponin i, poc: 0 ng/mL (ref 0.00–0.08)

## 2014-03-12 MED ORDER — POTASSIUM CHLORIDE CRYS ER 20 MEQ PO TBCR
40.0000 meq | EXTENDED_RELEASE_TABLET | Freq: Once | ORAL | Status: AC
  Administered 2014-03-12: 40 meq via ORAL
  Filled 2014-03-12: qty 2

## 2014-03-12 MED ORDER — FAMOTIDINE 20 MG PO TABS: 20.0000 mg | ORAL_TABLET | Freq: Two times a day (BID) | ORAL | Status: DC | PRN

## 2014-03-12 MED ORDER — SODIUM CHLORIDE 0.9 % IV BOLUS (SEPSIS)
1000.0000 mL | Freq: Once | INTRAVENOUS | Status: AC
  Administered 2014-03-12: 1000 mL via INTRAVENOUS

## 2014-03-12 MED ORDER — GI COCKTAIL ~~LOC~~
30.0000 mL | Freq: Once | ORAL | Status: AC
  Administered 2014-03-12: 30 mL via ORAL
  Filled 2014-03-12: qty 30

## 2014-03-12 MED ORDER — SUCRALFATE 1 G PO TABS: 1.0000 g | ORAL_TABLET | Freq: Three times a day (TID) | ORAL | Status: DC

## 2014-03-12 MED ORDER — FAMOTIDINE IN NACL 20-0.9 MG/50ML-% IV SOLN
20.0000 mg | Freq: Once | INTRAVENOUS | Status: AC
  Administered 2014-03-12: 20 mg via INTRAVENOUS
  Filled 2014-03-12: qty 50

## 2014-03-12 MED ORDER — ALBUTEROL SULFATE (2.5 MG/3ML) 0.083% IN NEBU
5.0000 mg | INHALATION_SOLUTION | Freq: Once | RESPIRATORY_TRACT | Status: AC
  Administered 2014-03-12: 5 mg via RESPIRATORY_TRACT
  Filled 2014-03-12: qty 6

## 2014-03-12 NOTE — ED Provider Notes (Signed)
CSN: [id number]     Arrival date & time 03/12/14  2038 History   First MD Initiated Contact with Patient 03/12/14 2208     Chief Complaint  Patient presents with  . Chest Pain     (Consider location/radiation/quality/duration/timing/severity/associated sxs/prior Treatment) The history is provided by the patient.  Sydney Griffin is a 33 y.o. female hx of HTN, chronic back pain, here with shortness of breath. Shortness of breath and shaky feeling for several weeks. Worse shortness of breath when she takes a deep breath. Intermittent chest tightness associated with it. Occasional burning sensation. It is worse at night as well. Denies fever or cough. Denies abdominal pain.    Past Medical History  Diagnosis Date  . Acid reflux   . Menstrual bleeding problem   . Headache(784.0)   . Ectopic pregnancy     Had MTX  . Hypertension   . Asthma May 2013    [facility] but pt denies and said she had bronchitis, used an Hydrologistinhlaer while sick and has now lost the inhaler.  Marland Kitchen. Dysrhythmia     rare palpitations   Past Surgical History  Procedure Laterality Date  . Cesarean section    . Dilation and curettage of uterus    . Laparoscopy    . Cesarean section  03/28/2012    Procedure: CESAREAN SECTION;  Surgeon: Antionette CharLisa Jackson-Moore, MD;  Location: [facility];  Service: Gynecology;  Laterality: N/A;  . Cesarean section N/A 04/20/2013    Procedure: CESAREAN SECTION;  Surgeon: Antionette CharLisa Jackson-Moore, MD;  Location: [facility];  Service: Obstetrics;  Laterality: N/A;  Repeat Cesarean Section    Family History  Problem Relation Age of Onset  . Anesthesia problems Neg Hx   . Other Neg Hx   . Hypertension Mother   . Diabetes Mother   . Diabetes Father   . Hypertension Brother    History  Substance Use Topics  . Smoking status: Current Every Day Smoker -- 1.00 packs/day for 14 years    Types: Cigarettes  . Smokeless tobacco: Never Used  . Alcohol Use: No   OB History   Grav Para Term  Preterm Abortions TAB SAB Ect Mult Living   4 3 3  0 1   1  3      Review of Systems  Respiratory: Positive for shortness of breath.   Cardiovascular: Positive for chest pain.  All other systems reviewed and are negative.     Allergies  Aspirin; Dilaudid; Ibuprofen; Oxycodone; Tramadol; and Vicodin  Home Medications   Prior to Admission medications   Medication Sig Start Date End Date Taking? Authorizing Provider  acetaminophen (TYLENOL) 325 MG tablet Take 325 mg by mouth every 6 (six) hours as needed for pain.    Yes Historical Provider, MD  amLODipine (NORVASC) 10 MG tablet Take 1 tablet (10 mg total) by mouth daily. 04/25/13  Yes Brock Badharles A Harper, MD  butalbital-acetaminophen-caffeine (FIORICET, ESGIC) 313785408850-325-40 MG per tablet Take 1 tablet by mouth 2 (two) times daily as needed for headache.   Yes Historical Provider, MD  esomeprazole (NEXIUM) 40 MG capsule Take 40 mg by mouth 2 (two) times daily.    Yes Historical Provider, MD  labetalol (NORMODYNE) 200 MG tablet Take 200 mg by mouth 2 (two) times daily.   Yes Historical Provider, MD  oxyCODONE-acetaminophen (PERCOCET) 5-325 MG per tablet Take 1 tablet by mouth every 6 (six) hours as needed for pain. 06/02/13  Yes Antionette CharLisa Jackson-Moore, MD  triamterene-hydrochlorothiazide Ilsa Iha(DYAZIDE)  50-25 MG per capsule Take 1 capsule by mouth every morning.   Yes Historical Provider, MD   BP 120/78  Pulse 75  Temp(Src) 98.5 F (36.9 C) (Oral)  Resp 18  SpO2 100%  LMP 03/05/2014 Physical Exam  Nursing note and vitals reviewed. Constitutional: She is oriented to person, place, and time. She appears well-developed and well-nourished.  Anxious   HENT:  Head: Normocephalic.  Mouth/Throat: Oropharynx is clear and moist.  Eyes: Conjunctivae are normal. Pupils are equal, round, and reactive to light.  Neck: Normal range of motion. Neck supple.  Cardiovascular: Normal rate, regular rhythm and normal heart sounds.   Pulmonary/Chest: Effort normal and  breath sounds normal. No respiratory distress. She has no wheezes. She has no rales. She exhibits no tenderness.  Abdominal: Soft. Bowel sounds are normal. She exhibits no distension. There is no tenderness. There is no rebound and no guarding.  Musculoskeletal: Normal range of motion. She exhibits no edema and no tenderness.  Neurological: She is alert and oriented to person, place, and time. No cranial nerve deficit. Coordination normal.  Skin: Skin is warm and dry.  Psychiatric: She has a normal mood and affect. Her behavior is normal. Judgment and thought content normal.    ED Course  Procedures (including critical care time) Labs Review Labs Reviewed  BASIC METABOLIC PANEL - Abnormal; Notable for the following:    Potassium 3.3 (*)    Glucose, Bld 134 (*)    GFR calc non Af Amer 83 (*)    Anion gap 16 (*)    All other components within normal limits  CBC  PRO B NATRIURETIC PEPTIDE  D-DIMER, QUANTITATIVE  I-STAT TROPOININ, ED    Imaging Review Dg Chest 2 View  03/12/2014   CLINICAL DATA:  Chest pain.  EXAM: CHEST  2 VIEW  COMPARISON:  Jan 12, 2012.  FINDINGS: The heart size and mediastinal contours are within normal limits. Both lungs are clear. No pneumothorax or pleural effusion is noted. The visualized skeletal structures are unremarkable.  IMPRESSION: No acute cardiopulmonary abnormality seen.   Electronically Signed   By: Roque LiasJames  Green M.D.   On: 03/12/2014 21:44     EKG Interpretation   Date/Time:  Monday March 12 2014 20:44:53 EDT Ventricular Rate:  83 PR Interval:  184 QRS Duration: 94 QT Interval:  396 QTC Calculation: 465 R Axis:   -25 Text Interpretation:  Normal sinus rhythm Nonspecific T wave abnormality  borderline prolonged QT No significant change since last tracing Confirmed  by YAO  MD, DAVID ([id number]) on 03/12/2014 10:08:54 PM      MDM   Final diagnoses:  None   Sydney Griffin is a 33 y.o. female here with SOB, CP. EKG unchanged from previous. Trop  neg and symptoms for a week. Will get d-dimer since she has progesterone implant. Consider reflux so will give pepcid, GI cocktail.   11:31 PM D-dimer neg. Feels about the same. I think likely reflux vs anxiety. Has GI f/u. Will d/c home with pepcid, carafate.    Richardean Canalavid H Yao, MD 03/12/14 [phone]

## 2014-03-12 NOTE — Discharge Instructions (Signed)
Take pepcid 20 mg as needed.   Continue nexium.   Take carafate as well.   Follow up with your GI doctor.   Return to ER if you have worse chest pain, shortness of breath, abdominal pain.

## 2014-03-12 NOTE — ED Notes (Addendum)
Pt reports central chest "shaking" x several weeks. Pt states that pain comes and goes, reports SOB and feeling tired. Pt in NAD. Lungs clear/equal. NAD.

## 2014-06-22 ENCOUNTER — Other Ambulatory Visit: Payer: Self-pay

## 2014-07-09 ENCOUNTER — Encounter (HOSPITAL_COMMUNITY): Payer: Self-pay | Admitting: Emergency Medicine

## 2014-12-15 ENCOUNTER — Emergency Department (HOSPITAL_COMMUNITY)
Admission: EM | Admit: 2014-12-15 | Discharge: 2014-12-15 | Disposition: A | Discharge status: Home / Self Care | Payer: Medicaid Other | Attending: Emergency Medicine | Admitting: Emergency Medicine

## 2014-12-15 ENCOUNTER — Encounter (HOSPITAL_COMMUNITY): Payer: Self-pay | Admitting: Emergency Medicine

## 2014-12-15 ENCOUNTER — Emergency Department (HOSPITAL_COMMUNITY): Payer: Medicaid Other

## 2014-12-15 DIAGNOSIS — Z79899 Other long term (current) drug therapy: Secondary | ICD-10-CM | POA: Diagnosis not present

## 2014-12-15 DIAGNOSIS — Z3202 Encounter for pregnancy test, result negative: Secondary | ICD-10-CM | POA: Diagnosis not present

## 2014-12-15 DIAGNOSIS — Z720 Tobacco use: Secondary | ICD-10-CM | POA: Insufficient documentation

## 2014-12-15 DIAGNOSIS — R0789 Other chest pain: Secondary | ICD-10-CM | POA: Diagnosis not present

## 2014-12-15 DIAGNOSIS — K219 Gastro-esophageal reflux disease without esophagitis: Secondary | ICD-10-CM | POA: Diagnosis not present

## 2014-12-15 DIAGNOSIS — R079 Chest pain, unspecified: Secondary | ICD-10-CM

## 2014-12-15 DIAGNOSIS — I10 Essential (primary) hypertension: Secondary | ICD-10-CM | POA: Diagnosis not present

## 2014-12-15 DIAGNOSIS — J45909 Unspecified asthma, uncomplicated: Secondary | ICD-10-CM | POA: Insufficient documentation

## 2014-12-15 DIAGNOSIS — Z8742 Personal history of other diseases of the female genital tract: Secondary | ICD-10-CM | POA: Insufficient documentation

## 2014-12-15 LAB — CBC
HCT: 39.6 % (ref 36.0–46.0)
HEMOGLOBIN: 13.4 g/dL (ref 12.0–15.0)
MCH: 32.6 pg (ref 26.0–34.0)
MCHC: 33.8 g/dL (ref 30.0–36.0)
MCV: 96.4 fL (ref 78.0–100.0)
Platelets: 154 10*3/uL (ref 150–400)
RBC: 4.11 MIL/uL (ref 3.87–5.11)
RDW: 12.9 % (ref 11.5–15.5)
WBC: 11.2 10*3/uL — AB (ref 4.0–10.5)

## 2014-12-15 LAB — BASIC METABOLIC PANEL
ANION GAP: 8 (ref 5–15)
BUN: 14 mg/dL (ref 6–23)
CO2: 24 mmol/L (ref 19–32)
Calcium: 9.3 mg/dL (ref 8.4–10.5)
Chloride: 104 mmol/L (ref 96–112)
Creatinine, Ser: 0.97 mg/dL (ref 0.50–1.10)
GFR calc Af Amer: 89 mL/min — ABNORMAL LOW (ref >90)
GFR calc non Af Amer: 77 mL/min — ABNORMAL LOW (ref >90)
Glucose, Bld: 97 mg/dL (ref 70–99)
Potassium: 3.9 mmol/L (ref 3.5–5.1)
Sodium: 136 mmol/L (ref 135–145)

## 2014-12-15 LAB — PREGNANCY, URINE: Preg Test, Ur: NEGATIVE

## 2014-12-15 LAB — I-STAT TROPONIN, ED: TROPONIN I, POC: 0 ng/mL (ref 0.00–0.08)

## 2014-12-15 MED ORDER — FAMOTIDINE 20 MG PO TABS
20.0000 mg | ORAL_TABLET | Freq: Once | ORAL | Status: AC
  Administered 2014-12-15: 20 mg via ORAL
  Filled 2014-12-15: qty 1

## 2014-12-15 MED ORDER — BUTALBITAL-APAP-CAFFEINE 50-325-40 MG PO TABS: 1.0000 | ORAL_TABLET | Freq: Four times a day (QID) | ORAL | Status: DC | PRN

## 2014-12-15 MED ORDER — AMLODIPINE BESYLATE 5 MG PO TABS: 5.0000 mg | ORAL_TABLET | Freq: Every day | ORAL | Status: DC

## 2014-12-15 MED ORDER — GI COCKTAIL ~~LOC~~
30.0000 mL | Freq: Once | ORAL | Status: AC
  Administered 2014-12-15: 30 mL via ORAL
  Filled 2014-12-15: qty 30

## 2014-12-15 NOTE — Discharge Instructions (Signed)
It was our pleasure to provide your ER care today - we hope that you feel better.  Take tylenol as need.  If heartburn/reflux symptoms, try taking pepcid and maalox as need.  Follow up with primary care doctor in coming week.  Return to ER if worse, new symptoms, fevers, trouble breathing, other concern.      Chest Pain (Nonspecific) It is often hard to give a specific diagnosis for the cause of chest pain. There is always a chance that your pain could be related to something serious, such as a heart attack or a blood clot in the lungs. You need to follow up with your health care provider for further evaluation. CAUSES   Heartburn.  Pneumonia or bronchitis.  Anxiety or stress.  Inflammation around your heart (pericarditis) or lung (pleuritis or pleurisy).  A blood clot in the lung.  A collapsed lung (pneumothorax). It can develop suddenly on its own (spontaneous pneumothorax) or from trauma to the chest.  Shingles infection (herpes zoster virus). The chest wall is composed of bones, muscles, and cartilage. Any of these can be the source of the pain.  The bones can be bruised by injury.  The muscles or cartilage can be strained by coughing or overwork.  The cartilage can be affected by inflammation and become sore (costochondritis). DIAGNOSIS  Lab tests or other studies may be needed to find the cause of your pain. Your health care provider may have you take a test called an ambulatory electrocardiogram (ECG). An ECG records your heartbeat patterns over a 24-hour period. You may also have other tests, such as:  Transthoracic echocardiogram (TTE). During echocardiography, sound waves are used to evaluate how blood flows through your heart.  Transesophageal echocardiogram (TEE).  Cardiac monitoring. This allows your health care provider to monitor your heart rate and rhythm in real time.  Holter monitor. This is a portable device that records your heartbeat and can help  diagnose heart arrhythmias. It allows your health care provider to track your heart activity for several days, if needed.  Stress tests by exercise or by giving medicine that makes the heart beat faster. TREATMENT   Treatment depends on what may be causing your chest pain. Treatment may include:  Acid blockers for heartburn.  Anti-inflammatory medicine.  Pain medicine for inflammatory conditions.  Antibiotics if an infection is present.  You may be advised to change lifestyle habits. This includes stopping smoking and avoiding alcohol, caffeine, and chocolate.  You may be advised to keep your head raised (elevated) when sleeping. This reduces the chance of acid going backward from your stomach into your esophagus. Most of the time, nonspecific chest pain will improve within 2-3 days with rest and mild pain medicine.  HOME CARE INSTRUCTIONS   If antibiotics were prescribed, take them as directed. Finish them even if you start to feel better.  For the next few days, avoid physical activities that bring on chest pain. Continue physical activities as directed.  Do not use any tobacco products, including cigarettes, chewing tobacco, or electronic cigarettes.  Avoid drinking alcohol.  Only take medicine as directed by your health care provider.  Follow your health care provider's suggestions for further testing if your chest pain does not go away.  Keep any follow-up appointments you made. If you do not go to an appointment, you could develop lasting (chronic) problems with pain. If there is any problem keeping an appointment, call to reschedule. SEEK MEDICAL CARE IF:   Your chest pain  does not go away, even after treatment.  You have a rash with blisters on your chest.  You have a fever. SEEK IMMEDIATE MEDICAL CARE IF:   You have increased chest pain or pain that spreads to your arm, neck, jaw, back, or abdomen.  You have shortness of breath.  You have an increasing cough,  or you cough up blood.  You have severe back or abdominal pain.  You feel nauseous or vomit.  You have severe weakness.  You faint.  You have chills. This is an emergency. Do not wait to see if the pain will go away. Get medical help at once. Call your local emergency services (911 in [place] Do not drive yourself to the hospital. MAKE SURE YOU:   Understand these instructions.  Will watch your condition.  Will get help right away if you are not doing well or get worse. Document Released: 06/03/2005 Document Revised: 08/29/2013 Document Reviewed: 03/29/2008 [facility] Patient Information 2015 [place], [place]. This information is not intended to replace advice given to you by your health care provider. Make sure you discuss any questions you have with your health care provider.     Chest Wall Pain Chest wall pain is pain in or around the bones and muscles of your chest. It may take up to 6 weeks to get better. It may take longer if you must stay physically active in your work and activities.  CAUSES  Chest wall pain may happen on its own. However, it may be caused by:  A viral illness like the flu.  Injury.  Coughing.  Exercise.  Arthritis.  Fibromyalgia.  Shingles. HOME CARE INSTRUCTIONS   Avoid overtiring physical activity. Try not to strain or perform activities that cause pain. This includes any activities using your chest or your abdominal and side muscles, especially if heavy weights are used.  Put ice on the sore area.  Put ice in a plastic bag.  Place a towel between your skin and the bag.  Leave the ice on for 15-20 minutes per hour while awake for the first 2 days.  Only take over-the-counter or prescription medicines for pain, discomfort, or fever as directed by your caregiver. SEEK IMMEDIATE MEDICAL CARE IF:   Your pain increases, or you are very uncomfortable.  You have a fever.  Your chest pain becomes worse.  You have new, unexplained  symptoms.  You have nausea or vomiting.  You feel sweaty or lightheaded.  You have a cough with phlegm (sputum), or you cough up blood. MAKE SURE YOU:   Understand these instructions.  Will watch your condition.  Will get help right away if you are not doing well or get worse. Document Released: 08/24/2005 Document Revised: 11/16/2011 Document Reviewed: 04/20/2011 [facility] Patient Information 2015 [place], [place]. This information is not intended to replace advice given to you by your health care provider. Make sure you discuss any questions you have with your health care provider.   Costochondritis Costochondritis, sometimes called Tietze syndrome, is a swelling and irritation (inflammation) of the tissue (cartilage) that connects your ribs with your breastbone (sternum). It causes pain in the chest and rib area. Costochondritis usually goes away on its own over time. It can take up to 6 weeks or longer to get better, especially if you are unable to limit your activities. CAUSES  Some cases of costochondritis have no known cause. Possible causes include:  Injury (trauma).  Exercise or activity such as lifting.  Severe coughing. SIGNS AND SYMPTOMS  Pain and tenderness in the chest and rib area.  Pain that gets worse when coughing or taking deep breaths.  Pain that gets worse with specific movements. DIAGNOSIS  Your health care provider will do a physical exam and ask about your symptoms. Chest X-rays or other tests may be done to rule out other problems. TREATMENT  Costochondritis usually goes away on its own over time. Your health care provider may prescribe medicine to help relieve pain. HOME CARE INSTRUCTIONS   Avoid exhausting physical activity. Try not to strain your ribs during normal activity. This would include any activities using chest, abdominal, and side muscles, especially if heavy weights are used.  Apply ice to the affected area for the first 2 days after the  pain begins.  Put ice in a plastic bag.  Place a towel between your skin and the bag.  Leave the ice on for 20 minutes, 2-3 times a day.  Only take over-the-counter or prescription medicines as directed by your health care provider. SEEK MEDICAL CARE IF:  You have redness or swelling at the rib joints. These are signs of infection.  Your pain does not go away despite rest or medicine. SEEK IMMEDIATE MEDICAL CARE IF:   Your pain increases or you are very uncomfortable.  You have shortness of breath or difficulty breathing.  You cough up blood.  You have worse chest pains, sweating, or vomiting.  You have a fever or persistent symptoms for more than 2-3 days.  You have a fever and your symptoms suddenly get worse. MAKE SURE YOU:   Understand these instructions.  Will watch your condition.  Will get help right away if you are not doing well or get worse. Document Released: 06/03/2005 Document Revised: 06/14/2013 Document Reviewed: 03/28/2013 [facility] Patient Information 2015 [place], [place]. This information is not intended to replace advice given to you by your health care provider. Make sure you discuss any questions you have with your health care provider.     Smoking Cessation Quitting smoking is important to your health and has many advantages. However, it is not always easy to quit since nicotine is a very addictive drug. Oftentimes, people try 3 times or more before being able to quit. This document explains the best ways for you to prepare to quit smoking. Quitting takes hard work and a lot of effort, but you can do it. ADVANTAGES OF QUITTING SMOKING  You will live longer, feel better, and live better.  Your body will feel the impact of quitting smoking almost immediately.  Within 20 minutes, blood pressure decreases. Your pulse returns to its normal level.  After 8 hours, carbon monoxide levels in the blood return to normal. Your oxygen level  increases.  After 24 hours, the chance of having a heart attack starts to decrease. Your breath, hair, and body stop smelling like smoke.  After 48 hours, damaged nerve endings begin to recover. Your sense of taste and smell improve.  After 72 hours, the body is virtually free of nicotine. Your bronchial tubes relax and breathing becomes easier.  After 2 to 12 weeks, lungs can hold more air. Exercise becomes easier and circulation improves.  The risk of having a heart attack, stroke, cancer, or lung disease is greatly reduced.  After 1 year, the risk of coronary heart disease is cut in half.  After 5 years, the risk of stroke falls to the same as a nonsmoker.  After 10 years, the risk of lung cancer is cut in  half and the risk of other cancers decreases significantly.  After 15 years, the risk of coronary heart disease drops, usually to the level of a nonsmoker.  If you are pregnant, quitting smoking will improve your chances of having a healthy baby.  The people you live with, especially any children, will be healthier.  You will have extra money to spend on things other than cigarettes. QUESTIONS TO THINK ABOUT BEFORE ATTEMPTING TO QUIT You may want to talk about your answers with your health care provider.  Why do you want to quit?  If you tried to quit in the past, what helped and what did not?  What will be the most difficult situations for you after you quit? How will you plan to handle them?  Who can help you through the tough times? Your family? Friends? A health care provider?  What pleasures do you get from smoking? What ways can you still get pleasure if you quit? Here are some questions to ask your health care provider:  How can you help me to be successful at quitting?  What medicine do you think would be best for me and how should I take it?  What should I do if I need more help?  What is smoking withdrawal like? How can I get information on withdrawal? GET  READY  Set a quit date.  Change your environment by getting rid of all cigarettes, ashtrays, matches, and lighters in your home, car, or work. Do not let people smoke in your home.  Review your past attempts to quit. Think about what worked and what did not. GET SUPPORT AND ENCOURAGEMENT You have a better chance of being successful if you have help. You can get support in many ways.  Tell your family, friends, and coworkers that you are going to quit and need their support. Ask them not to smoke around you.  Get individual, group, or telephone counseling and support. Programs are available at [employer] and health centers. Call your local health department for information about programs in your area.  Spiritual beliefs and practices may help some smokers quit.  Download a "quit meter" on your computer to keep track of quit statistics, such as how long you have gone without smoking, cigarettes not smoked, and money saved.  Get a self-help book about quitting smoking and staying off tobacco. LEARN NEW SKILLS AND BEHAVIORS  Distract yourself from urges to smoke. Talk to someone, go for a walk, or occupy your time with a task.  Change your normal routine. Take a different route to work. Drink tea instead of coffee. Eat breakfast in a different place.  Reduce your stress. Take a hot bath, exercise, or read a book.  Plan something enjoyable to do every day. Reward yourself for not smoking.  Explore interactive web-based programs that specialize in helping you quit. GET MEDICINE AND USE IT CORRECTLY Medicines can help you stop smoking and decrease the urge to smoke. Combining medicine with the above behavioral methods and support can greatly increase your chances of successfully quitting smoking.  Nicotine replacement therapy helps deliver nicotine to your body without the negative effects and risks of smoking. Nicotine replacement therapy includes nicotine gum, lozenges, inhalers,  nasal sprays, and skin patches. Some may be available over-the-counter and others require a prescription.  Antidepressant medicine helps people abstain from smoking, but how this works is unknown. This medicine is available by prescription.  Nicotinic receptor partial agonist medicine simulates the effect of nicotine in  your brain. This medicine is available by prescription. Ask your health care provider for advice about which medicines to use and how to use them based on your health history. Your health care provider will tell you what side effects to look out for if you choose to be on a medicine or therapy. Carefully read the information on the package. Do not use any other product containing nicotine while using a nicotine replacement product.  RELAPSE OR DIFFICULT SITUATIONS Most relapses occur within the first 3 months after quitting. Do not be discouraged if you start smoking again. Remember, most people try several times before finally quitting. You may have symptoms of withdrawal because your body is used to nicotine. You may crave cigarettes, be irritable, feel very hungry, cough often, get headaches, or have difficulty concentrating. The withdrawal symptoms are only temporary. They are strongest when you first quit, but they will go away within 10-14 days. To reduce the chances of relapse, try to:  Avoid drinking alcohol. Drinking lowers your chances of successfully quitting.  Reduce the amount of caffeine you consume. Once you quit smoking, the amount of caffeine in your body increases and can give you symptoms, such as a rapid heartbeat, sweating, and anxiety.  Avoid smokers because they can make you want to smoke.  Do not let weight gain distract you. Many smokers will gain weight when they quit, usually less than 10 pounds. Eat a healthy diet and stay active. You can always lose the weight gained after you quit.  Find ways to improve your mood other than smoking. FOR MORE  INFORMATION  www.smokefree.gov  Document Released: 08/18/2001 Document Revised: 01/08/2014 Document Reviewed: 12/03/2011 [facility] Patient Information 2015 [place], [place]. This information is not intended to replace advice given to you by your health care provider. Make sure you discuss any questions you have with your health care provider.

## 2014-12-15 NOTE — ED Notes (Signed)
Patient c/o left sided intermittent "sharp" CP, non radiating, x2 weeks. Rates pain 8/10. Self medicated with oxycodone 10mg  1600. Denies SOB, N/V, lightheadedness, dizziness, weakness.

## 2014-12-15 NOTE — ED Notes (Signed)
Pt reported intermittent epigastic and lt lower chest pain for 2 weeks, denies radiation, (-) N/V and diaphoresis, and (+) SHOB . Pt denies any heavy lifting and trauma/injury. SR on monitor. Family at bedside.

## 2014-12-15 NOTE — ED Notes (Signed)
Attempted to start IV x2 venipuncture and unsuccessful. Pt tolerated well.

## 2014-12-15 NOTE — ED Notes (Signed)
Nurse starting IV and drawing labs. 

## 2014-12-15 NOTE — ED Notes (Addendum)
Awake. Verbally responsive. A/O x4. Resp even and unlabored. No audible adventitious breath sounds noted. ABC's intact. SR on monitor. Denies chest pain. Family at bedside.

## 2014-12-15 NOTE — ED Notes (Signed)
Delay in lab draw, pt enroute to exray 

## 2014-12-15 NOTE — ED Provider Notes (Addendum)
CSN: [id number]     Arrival date & time 12/15/14  2034 History   First MD Initiated Contact with Patient 12/15/14 2110     Chief Complaint  Patient presents with  . Chest Pain     (Consider location/radiation/quality/duration/timing/severity/associated sxs/prior Treatment) Patient is a 34 y.o. female presenting with chest pain. The history is provided by the patient.  Chest Pain Associated symptoms: no abdominal pain, no back pain, no cough, no fever, no headache, no palpitations, no shortness of breath and not vomiting   pt c/o episodes sharp chest pain for the past 2 weeks. Episodes last a couple seconds each. Occur at rest. Occasionally occurs if moves or turns a certain way. No change whether upright or supine. No relation to activity or exertion. No associated nv, diaphoresis or sob. No constant and/or pleuritic pain. No unusual doe or fatigue. At times feels like heartburn sensation. ?hx gerd. No chest wall injury or strain. No leg pain or swelling. No recent immobility, trauma, travel or surgery. Smoker. No cocaine use. No hx dvt or pe. No fam hx premature cad. No cough or uri c/o. No fever or chills.      Past Medical History  Diagnosis Date  . Acid reflux   . Menstrual bleeding problem   . Headache(784.0)   . Ectopic pregnancy     Had MTX  . Hypertension   . Asthma May 2013    info from [facility] states asthma but pt denies and said she had bronchitis, used an Hydrologist while sick and has now lost the inhaler.  Marland Kitchen Dysrhythmia     rare palpitations   Past Surgical History  Procedure Laterality Date  . Cesarean section    . Dilation and curettage of uterus    . Laparoscopy    . Cesarean section  03/28/2012    Procedure: CESAREAN SECTION;  Surgeon: Antionette Char, MD;  Location: [facility];  Service: Gynecology;  Laterality: N/A;  . Cesarean section N/A 04/20/2013    Procedure: CESAREAN SECTION;  Surgeon: Antionette Char, MD;  Location: [facility];  Service: Obstetrics;  Laterality:  N/A;  Repeat Cesarean Section    Family History  Problem Relation Age of Onset  . Anesthesia problems Neg Hx   . Other Neg Hx   . Hypertension Mother   . Diabetes Mother   . Diabetes Father   . Hypertension Brother    History  Substance Use Topics  . Smoking status: Current Every Day Smoker -- 1.00 packs/day for 14 years    Types: Cigarettes  . Smokeless tobacco: Never Used  . Alcohol Use: No   OB History    Gravida Para Term Preterm AB TAB SAB Ectopic Multiple Living   0 Review of Systems  Constitutional: Negative for fever and chills.  HENT: Negative for sore throat.   Eyes: Negative for redness.  Respiratory: Negative for cough and shortness of breath.   Cardiovascular: Positive for chest pain. Negative for palpitations and leg swelling.  Gastrointestinal: Negative for vomiting, abdominal pain and diarrhea.  Genitourinary: Negative for flank pain.  Musculoskeletal: Negative for back pain and neck pain.  Skin: Negative for rash.  Neurological: Negative for headaches.  Hematological: Does not bruise/bleed easily.  Psychiatric/Behavioral: Negative for confusion.      Allergies  Aspirin; Dilaudid; Ibuprofen; Oxycodone; Tramadol; and Vicodin  Home Medications   Prior to Admission medications   Medication Sig Start Date End Date  Taking? Authorizing Provider  acetaminophen (TYLENOL) 325 MG tablet Take 325 mg by mouth every 6 (six) hours as needed for pain.     Historical Provider, MD  amLODipine (NORVASC) 10 MG tablet Take 1 tablet (10 mg total) by mouth daily. 04/25/13   Brock Bad, MD  butalbital-acetaminophen-caffeine (FIORICET, ESGIC) [phone] MG per tablet Take 1 tablet by mouth 2 (two) times daily as needed for headache.    Historical Provider, MD  esomeprazole (NEXIUM) 40 MG capsule Take 40 mg by mouth 2 (two) times daily.     Historical Provider, MD  famotidine (PEPCID) 20 MG tablet Take 1 tablet (20 mg total) by mouth 2 (two) times  daily as needed for heartburn or indigestion. 03/12/14   Richardean Canal, MD  labetalol (NORMODYNE) 200 MG tablet Take 200 mg by mouth 2 (two) times daily.    Historical Provider, MD  oxyCODONE-acetaminophen (PERCOCET) 5-325 MG per tablet Take 1 tablet by mouth every 6 (six) hours as needed for pain. 06/02/13   Antionette Char, MD  sucralfate (CARAFATE) 1 G tablet Take 1 tablet (1 g total) by mouth 4 (four) times daily -  with meals and at bedtime. 03/12/14   Richardean Canal, MD  triamterene-hydrochlorothiazide (DYAZIDE) 50-25 MG per capsule Take 1 capsule by mouth every morning.    Historical Provider, MD   BP 145/85 mmHg  Pulse 72  Temp(Src) 98.8 F (37.1 C) (Oral)  Resp 16  SpO2 99%  LMP 12/05/2014 Physical Exam  Constitutional: She appears well-developed and well-nourished. No distress.  HENT:  Mouth/Throat: Oropharynx is clear and moist.  Eyes: Conjunctivae are normal. No scleral icterus.  Neck: Neck supple. No tracheal deviation present.  Cardiovascular: Normal rate, regular rhythm, normal heart sounds and intact distal pulses.   Pulmonary/Chest: Effort normal and breath sounds normal. No respiratory distress. She exhibits tenderness.  Abdominal: Soft. Normal appearance and bowel sounds are normal. She exhibits no distension. There is no tenderness.  Musculoskeletal: She exhibits no edema or tenderness.  Neurological: She is alert.  Skin: Skin is warm and dry. No rash noted. She is not diaphoretic.  Psychiatric: She has a normal mood and affect.  Nursing note and vitals reviewed.   ED Course  Procedures (including critical care time) Labs Review  Results for orders placed or performed during the hospital encounter of 12/15/14  CBC  Result Value Ref Range   WBC 11.2 (H) 4.0 - 10.5 K/uL   RBC 4.11 3.87 - 5.11 MIL/uL   Hemoglobin 13.4 12.0 - 15.0 g/dL   HCT 45.4 09.8 - 11.9 %   MCV 96.4 78.0 - 100.0 fL   MCH 32.6 26.0 - 34.0 pg   MCHC 33.8 30.0 - 36.0 g/dL   RDW 14.7 82.9 - 56.2 %    Platelets 154 150 - 400 K/uL  Basic metabolic panel  Result Value Ref Range   Sodium 136 135 - 145 mmol/L   Potassium 3.9 3.5 - 5.1 mmol/L   Chloride 104 96 - 112 mmol/L   CO2 24 19 - 32 mmol/L   Glucose, Bld 97 70 - 99 mg/dL   BUN 14 6 - 23 mg/dL   Creatinine, Ser 1.30 0.50 - 1.10 mg/dL   Calcium 9.3 8.4 - 86.5 mg/dL   GFR calc non Af Amer 77 (L) >90 mL/min   GFR calc Af Amer 89 (L) >90 mL/min   Anion gap 8 5 - 15  Pregnancy,  urine - (pre-menopausal females)  Result Value Ref  Range   Preg Test, Ur NEGATIVE NEGATIVE  I-stat troponin, ED ([facility])  Result Value Ref Range   Troponin i, poc 0.00 0.00 - 0.08 ng/mL   Comment 3           Dg Chest 2 View  12/15/2014   CLINICAL DATA:  Chest pain.  EXAM: CHEST  2 VIEW  COMPARISON:  March 12, 2014.  FINDINGS: The heart size and mediastinal contours are within normal limits. Both lungs are clear. No pneumothorax or pleural effusion is noted. The visualized skeletal structures are unremarkable.  IMPRESSION: No active cardiopulmonary disease.   Electronically Signed   By: Lupita RaiderJames  Green Jr, M.D.   On: 12/15/2014 21:48        EKG Interpretation   Date/Time:  Saturday December 15 2014 20:54:26 EDT Ventricular Rate:  76 PR Interval:  176 QRS Duration: 93 QT Interval:  396 QTC Calculation: 445 R Axis:   8 Text Interpretation:  Sinus rhythm Nonspecific T wave abnormality No  significant change since last tracing Confirmed by Denton LankSTEINL  MD, Caryn BeeKEVIN  ([id number]) on 12/15/2014 9:19:58 PM      MDM   Labs sent from triage.   Reviewed nursing notes and prior charts for additional history.   After 2 weeks symptoms, trop neg.  Symptoms (seconds duration, sharp, transient) do not appear c/w ACS.    Discussed smoking cessation w pt.    Recheck symptom free.   Pt currently appears stable for d/c.   At d/c pt requests refill of fioricet and norvasc 5 mg a day. No current headache.        Cathren LaineKevin Javonna Balli, MD 12/15/14 [phone]

## 2014-12-15 NOTE — ED Notes (Signed)
Awake. Verbally responsive. A/O x4. Resp even and unlabored. No audible adventitious breath sounds noted. ABC's intact.  

## 2015-03-19 ENCOUNTER — Encounter (HOSPITAL_COMMUNITY): Payer: Self-pay | Admitting: Emergency Medicine

## 2015-03-19 ENCOUNTER — Emergency Department (HOSPITAL_COMMUNITY)
Admission: EM | Admit: 2015-03-19 | Discharge: 2015-03-19 | Disposition: A | Discharge status: Home / Self Care | Payer: Medicaid Other | Attending: Emergency Medicine | Admitting: Emergency Medicine

## 2015-03-19 DIAGNOSIS — G44209 Tension-type headache, unspecified, not intractable: Secondary | ICD-10-CM | POA: Insufficient documentation

## 2015-03-19 DIAGNOSIS — Z720 Tobacco use: Secondary | ICD-10-CM | POA: Diagnosis not present

## 2015-03-19 DIAGNOSIS — Z8742 Personal history of other diseases of the female genital tract: Secondary | ICD-10-CM | POA: Insufficient documentation

## 2015-03-19 DIAGNOSIS — R51 Headache: Secondary | ICD-10-CM | POA: Diagnosis present

## 2015-03-19 DIAGNOSIS — I10 Essential (primary) hypertension: Secondary | ICD-10-CM | POA: Insufficient documentation

## 2015-03-19 DIAGNOSIS — J45909 Unspecified asthma, uncomplicated: Secondary | ICD-10-CM | POA: Insufficient documentation

## 2015-03-19 DIAGNOSIS — Z79899 Other long term (current) drug therapy: Secondary | ICD-10-CM | POA: Diagnosis not present

## 2015-03-19 DIAGNOSIS — K219 Gastro-esophageal reflux disease without esophagitis: Secondary | ICD-10-CM | POA: Diagnosis not present

## 2015-03-19 MED ORDER — DIPHENHYDRAMINE HCL 50 MG/ML IJ SOLN
25.0000 mg | Freq: Once | INTRAMUSCULAR | Status: AC
  Administered 2015-03-19: 25 mg via INTRAMUSCULAR
  Filled 2015-03-19: qty 1

## 2015-03-19 MED ORDER — METOCLOPRAMIDE HCL 5 MG/ML IJ SOLN
10.0000 mg | Freq: Once | INTRAMUSCULAR | Status: AC
  Administered 2015-03-19: 10 mg via INTRAMUSCULAR
  Filled 2015-03-19: qty 2

## 2015-03-19 NOTE — Discharge Instructions (Signed)

## 2015-03-19 NOTE — ED Notes (Signed)
Pt. Stated, I've had head pain for the last 3 days.

## 2015-03-19 NOTE — ED Provider Notes (Signed)
CSN: [id number]     Arrival date & time 03/19/15  1438 History  This chart was scribed for non-physician practitioner, Cheron Schaumann, PA-C working with Gerhard Munch, MD by Placido Sou, ED scribe. This patient was seen in room TR07C/TR07C and the patient's care was started at 5:54 PM.    Chief Complaint  Patient presents with  . Headache   The history is provided by the patient. No language interpreter was used.    HPI Comments: Sydney Griffin is a 34 y.o. female, with a history of HTN, headaches and chronic back pain, who presents to the Emergency Department complaining of a constant, moderate, headache with onset 3 days ago. She describes the pain as "throbbing and sharp" and notes it is focused on the top of her head. She notes taking Fioricet for her chronic headaches and further notes that this typically works but has not provided any relief of her current symptoms. Pt notes a history of chronic back pain and currently takes 10 mg oxycodone with her last dosage 3 hours ago. She notes currently visiting a pain clinic for this issue. Pt notes being unable to take ibuprofen, Vicodin, aspirin, dilaudid and tramadol. Pt denies having ever seen a neurologist. She denies any rashes or sores.    Past Medical History  Diagnosis Date  . Acid reflux   . Menstrual bleeding problem   . Headache(784.0)   . Ectopic pregnancy     Had MTX  . Hypertension   . Asthma May 2013    info from [facility] states asthma but pt denies and said she had bronchitis, used an Hydrologist while sick and has now lost the inhaler.  Marland Kitchen Dysrhythmia     rare palpitations   Past Surgical History  Procedure Laterality Date  . Cesarean section    . Dilation and curettage of uterus    . Laparoscopy    . Cesarean section  03/28/2012    Procedure: CESAREAN SECTION;  Surgeon: Antionette Char, MD;  Location: [facility];  Service: Gynecology;  Laterality: N/A;  . Cesarean section N/A 04/20/2013    Procedure: CESAREAN SECTION;   Surgeon: Antionette Char, MD;  Location: [facility];  Service: Obstetrics;  Laterality: N/A;  Repeat Cesarean Section    Family History  Problem Relation Age of Onset  . Anesthesia problems Neg Hx   . Other Neg Hx   . Hypertension Mother   . Diabetes Mother   . Diabetes Father   . Hypertension Brother    History  Substance Use Topics  . Smoking status: Current Every Day Smoker -- 1.00 packs/day for 14 years    Types: Cigarettes  . Smokeless tobacco: Never Used  . Alcohol Use: No   OB History    Gravida Para Term Preterm AB TAB SAB Ectopic Multiple Living   0 Review of Systems  Musculoskeletal: Positive for myalgias.  Skin: Negative for rash and wound.  Neurological: Positive for headaches.  All other systems reviewed and are negative.  Allergies  Aspirin; Dilaudid; Ibuprofen; Oxycodone; Tramadol; and Vicodin  Home Medications   Prior to Admission medications   Medication Sig Start Date End Date Taking? Authorizing Provider  acetaminophen (TYLENOL) 325 MG tablet Take 325 mg by mouth every 6 (six) hours as needed for headache (headache).     Historical Provider, MD  amLODipine (NORVASC) 10 MG tablet Take 1 tablet (10 mg total) by mouth daily. Patient not  taking: Reported on 12/15/2014 04/25/13   Brock Badharles A Harper, MD  amLODipine (NORVASC) 5 MG tablet Take 1 tablet (5 mg total) by mouth daily. 12/15/14   Cathren LaineKevin Steinl, MD  butalbital-acetaminophen-caffeine (FIORICET) [phone] MG per tablet Take 1-2 tablets by mouth every 6 (six) hours as needed for headache. No driving when taking. 12/15/14 12/15/15  Cathren LaineKevin Steinl, MD  butalbital-acetaminophen-caffeine (FIORICET, ESGIC) [phone] MG per tablet Take 1 tablet by mouth 2 (two) times daily as needed for headache (headache).     Historical Provider, MD  famotidine (PEPCID) 20 MG tablet Take 1 tablet (20 mg total) by mouth 2 (two) times daily as needed for heartburn or indigestion. Patient not taking: Reported on 12/15/2014  03/12/14   Richardean Canalavid H Yao, MD  labetalol (NORMODYNE) 200 MG tablet Take 200 mg by mouth 2 (two) times daily.    Historical Provider, MD  oxyCODONE-acetaminophen (PERCOCET) 5-325 MG per tablet Take 1 tablet by mouth every 6 (six) hours as needed for pain. Patient not taking: Reported on 12/15/2014 06/02/13   Antionette CharLisa Jackson-Moore, MD  sucralfate (CARAFATE) 1 G tablet Take 1 tablet (1 g total) by mouth 4 (four) times daily -  with meals and at bedtime. Patient not taking: Reported on 12/15/2014 03/12/14   Richardean Canalavid H Yao, MD  triamterene-hydrochlorothiazide (DYAZIDE) 50-25 MG per capsule Take 1 capsule by mouth every morning.    Historical Provider, MD   BP 127/79 mmHg  Pulse 87  Temp(Src) 98.2 F (36.8 C) (Oral)  Resp 17  Ht 5\' 9"  (1.753 m)  Wt 321 lb (145.605 kg)  BMI 47.38 kg/m2  SpO2 98%  LMP 02/27/2015 Physical Exam  Constitutional: She is oriented to person, place, and time. She appears well-developed and well-nourished. No distress.  HENT:  Head: Normocephalic and atraumatic.  Mouth/Throat: Oropharynx is clear and moist.  Eyes: Conjunctivae and EOM are normal. Pupils are equal, round, and reactive to light.  Neck: Normal range of motion. Neck supple. No tracheal deviation present.  Cardiovascular: Normal rate.   Pulmonary/Chest: Breath sounds normal. No respiratory distress.  Abdominal: Soft.  Musculoskeletal: Normal range of motion.  Neurological: She is alert and oriented to person, place, and time.  Skin: Skin is warm and dry.  Psychiatric: She has a normal mood and affect. Her behavior is normal.  Nursing note and vitals reviewed.   ED Course  Procedures  DIAGNOSTIC STUDIES: Oxygen Saturation is 98% on RA, normal by my interpretation.    COORDINATION OF CARE: 6:00 PM Discussed treatment plan with pt at bedside and pt agreed to plan.  Labs Review Labs Reviewed - No data to display  Imaging Review No results found.   EKG Interpretation None      MDM pt given reglan and  benadryl im.   Pt has no red flags.  Pt has chronic headaches.   She is in pain management Final diagnoses:  Tension-type headache, not intractable, unspecified chronicity pattern    I personally performed the services in this documentation, which was scribed in my presence.  The recorded information has been reviewed and considered.   Sydney Griffin.    Sydney AreasLeslie K Sofia, PA-C 03/19/15 2106  Gerhard Munchobert Lockwood, MD 03/19/15 [phone]

## 2015-05-17 ENCOUNTER — Telehealth: Payer: Self-pay | Admitting: *Deleted

## 2015-05-17 NOTE — Telephone Encounter (Signed)
Pt called to office stating she would like to know what's best to use OTC for yeast infection.  Return call to pt. Pt states that she got generic brand yeast medication, suppository type.  Pt advised that this would be fine to use to treat yeast.  Pt asked if it needed to be used at night as directed.  Pt made aware that it would be most effective if used at night or at her time of sleep.   Pt made aware that she may try medication at anytime she feels comfortable.  Pt again made aware of time to use to be most effective. Pt has appt on Wednesday 9/14.  Pt was advised to keep appt for evaluation if symptoms do not resolve with OTC treatment.  Pt states understanding.

## 2015-05-22 ENCOUNTER — Ambulatory Visit: Payer: Medicaid Other | Admitting: Obstetrics

## 2015-07-08 ENCOUNTER — Institutional Professional Consult (permissible substitution): Payer: Self-pay | Admitting: Obstetrics

## 2016-02-26 ENCOUNTER — Encounter: Payer: Self-pay | Admitting: Obstetrics

## 2016-02-26 ENCOUNTER — Ambulatory Visit (INDEPENDENT_AMBULATORY_CARE_PROVIDER_SITE_OTHER): Payer: Medicaid Other | Admitting: Obstetrics

## 2016-02-26 VITALS — BP 145/82 | HR 90 | Wt 314.6 lb

## 2016-02-26 DIAGNOSIS — Z01419 Encounter for gynecological examination (general) (routine) without abnormal findings: Secondary | ICD-10-CM

## 2016-02-26 DIAGNOSIS — Z0000 Encounter for general adult medical examination without abnormal findings: Secondary | ICD-10-CM

## 2016-02-26 DIAGNOSIS — Z3040 Encounter for surveillance of contraceptives, unspecified: Secondary | ICD-10-CM

## 2016-02-27 ENCOUNTER — Encounter: Payer: Self-pay | Admitting: Obstetrics

## 2016-02-27 NOTE — Progress Notes (Signed)
Subjective:        Sydney Griffin is a 35 y.o. female here for a routine exam.  Current complaints: None.    Personal health questionnaire:  Is patient [name], have a family history of breast and/or ovarian cancer: no Is there a family history of uterine cancer diagnosed at age < 2350, gastrointestinal cancer, urinary tract cancer, family member who is a Personnel officerLynch syndrome-associated carrier: no Is the patient overweight and hypertensive, family history of diabetes, personal history of gestational diabetes, preeclampsia or PCOS: no Is patient over 7355, have PCOS,  family history of premature CHD under age 33, diabetes, smoke, have hypertension or peripheral artery disease:  no At any time, has a partner hit, kicked or otherwise hurt or frightened you?: no Over the past 2 weeks, have you felt down, depressed or hopeless?: no Over the past 2 weeks, have you felt little interest or pleasure in doing things?:no   Gynecologic History Patient's last menstrual period was 02/15/2016. Contraception: Nexplanon Last Pap: 2014. Results were: abnormal ( ASCUS ) Last mammogram: n/a. Results were: n/a  Obstetric History OB History  Gravida Para Term Preterm AB SAB TAB Ectopic Multiple Living  4 3 3  0 1   1  3     # Outcome Date GA Lbr Len/2nd Weight Sex Delivery Anes PTL Lv  4 Term 04/20/13 8123w0d  7 lb 9 oz (3.43 kg) F CS-LTranv Spinal  Y  3 Term 03/28/12 5823w0d  5 lb 10 oz (2.55 kg) F CS-LTranv Spinal  Y  2 Term 03/08/03 4460w0d  7 lb 2 oz (3.232 kg) F Vag-Spont EPI  Y  1 Ectopic               Past Medical History  Diagnosis Date  . Acid reflux   . Menstrual bleeding problem   . Headache(784.0)   . Ectopic pregnancy     Had MTX  . Hypertension   . Asthma May 2013    [facility] asthma but pt denies and said she had bronchitis, used an Hydrologistinhlaer while sick and has now lost the inhaler.  Marland Kitchen. Dysrhythmia     rare palpitations    Past Surgical History  Procedure Laterality  Date  . Cesarean section    . Dilation and curettage of uterus    . Laparoscopy    . Cesarean section  03/28/2012    Procedure: CESAREAN SECTION;  Surgeon: Antionette CharLisa Jackson-Moore, MD;  Location: [facility];  Service: Gynecology;  Laterality: N/A;  . Cesarean section N/A 04/20/2013    Procedure: CESAREAN SECTION;  Surgeon: Antionette CharLisa Jackson-Moore, MD;  Location: [facility];  Service: Obstetrics;  Laterality: N/A;  Repeat Cesarean Section      Current outpatient prescriptions:  .  butalbital-acetaminophen-caffeine (FIORICET, ESGIC) 50-325-40 MG tablet, Take by mouth 2 (two) times daily as needed for headache., Disp: , Rfl:  .  labetalol (NORMODYNE) 200 MG tablet, Take 200 mg by mouth 2 (two) times daily., Disp: , Rfl:  .  oxyCODONE-acetaminophen (PERCOCET) 10-325 MG tablet, Take 1 tablet by mouth every 4 (four) hours as needed for pain., Disp: , Rfl:  .  triamterene-hydrochlorothiazide (DYAZIDE) 50-25 MG per capsule, Take 1 capsule by mouth every morning., Disp: , Rfl:  .  acetaminophen (TYLENOL) 325 MG tablet, Take 325 mg by mouth every 6 (six) hours as needed for headache (headache). , Disp: , Rfl:  .  amLODipine (NORVASC) 5 MG tablet, Take 1 tablet (5 mg total) by mouth daily. (  Patient not taking: Reported on 02/26/2016), Disp: 30 tablet, Rfl: 0 Allergies  Allergen Reactions  . Aspirin Shortness Of Breath  . Dilaudid [Hydromorphone Hcl] Other (See Comments)    Pt says medication causes her to shake uncontrollably.  . Ibuprofen     Gi upset  . Oxycodone     Headache  . Tramadol Nausea Only and Other (See Comments)    Causes headache.  . Vicodin [Hydrocodone-Acetaminophen] Other (See Comments)    Causes headache.    Social History  Substance Use Topics  . Smoking status: Current Every Day Smoker -- 1.00 packs/day for 14 years    Types: Cigarettes  . Smokeless tobacco: Never Used  . Alcohol Use: No    Family History  Problem Relation Age of Onset  . Anesthesia problems Neg Hx   . Other Neg Hx    . Hypertension Mother   . Diabetes Mother   . Diabetes Father   . Hypertension Brother       Review of Systems  Constitutional: negative for fatigue and weight loss Respiratory: negative for cough and wheezing Cardiovascular: negative for chest pain, fatigue and palpitations Gastrointestinal: negative for abdominal pain and change in bowel habits Musculoskeletal:negative for myalgias Neurological: negative for gait problems and tremors Behavioral/Psych: negative for abusive relationship, depression Endocrine: negative for temperature intolerance   Genitourinary:negative for abnormal menstrual periods, genital lesions, hot flashes, sexual problems and vaginal discharge Integument/breast: negative for breast lump, breast tenderness, nipple discharge and skin lesion(s)    Objective:       BP 145/82 mmHg  Pulse 90  Wt 314 lb 9.6 oz (142.702 kg)  LMP 02/15/2016 General:   alert  Skin:   no rash or abnormalities  Lungs:   clear to auscultation bilaterally  Heart:   regular rate and rhythm, S1, S2 normal, no murmur, click, rub or gallop  Breasts:   normal without suspicious masses, skin or nipple changes or axillary nodes  Abdomen:  normal findings: no organomegaly, soft, non-tender and no hernia  Pelvis:  External genitalia: normal general appearance Urinary system: urethral meatus normal and bladder without fullness, nontender Vaginal: normal without tenderness, induration or masses Cervix: normal appearance Adnexa: normal bimanual exam Uterus: anteverted and non-tender, normal size   Lab Review Urine pregnancy test Labs reviewed yes Radiologic studies reviewed no   Assessment:    Healthy female exam.    Contraceptive Surveillance.  Pleased with Nexplanon.   Plan:    Education reviewed: low fat, low cholesterol diet, safe sex/STD prevention, self breast exams and weight bearing exercise. Contraception: Nexplanon. Follow up in: 1 year.   Meds ordered this  encounter  Medications  . butalbital-acetaminophen-caffeine (FIORICET, ESGIC) 50-325-40 MG tablet    Sig: Take by mouth 2 (two) times daily as needed for headache.  . oxyCODONE-acetaminophen (PERCOCET) 10-325 MG tablet    Sig: Take 1 tablet by mouth every 4 (four) hours as needed for pain.   No orders of the defined types were placed in this encounter.

## 2016-02-29 ENCOUNTER — Other Ambulatory Visit: Payer: Self-pay | Admitting: Obstetrics

## 2016-02-29 DIAGNOSIS — B3731 Acute candidiasis of vulva and vagina: Secondary | ICD-10-CM

## 2016-02-29 DIAGNOSIS — B373 Candidiasis of vulva and vagina: Secondary | ICD-10-CM

## 2016-02-29 DIAGNOSIS — N760 Acute vaginitis: Principal | ICD-10-CM

## 2016-02-29 DIAGNOSIS — B9689 Other specified bacterial agents as the cause of diseases classified elsewhere: Secondary | ICD-10-CM

## 2016-02-29 LAB — NUSWAB VG+, CANDIDA 6SP
ATOPOBIUM VAGINAE: HIGH {score} — AB
BVAB 2: HIGH Score — AB
CANDIDA KRUSEI, NAA: NEGATIVE
Candida albicans, NAA: POSITIVE — AB
Candida glabrata, NAA: NEGATIVE
Candida lusitaniae, NAA: NEGATIVE
Candida parapsilosis, NAA: NEGATIVE
Candida tropicalis, NAA: NEGATIVE
Chlamydia trachomatis, NAA: NEGATIVE
MEGASPHAERA 1: HIGH {score} — AB
NEISSERIA GONORRHOEAE, NAA: NEGATIVE
TRICH VAG BY NAA: NEGATIVE

## 2016-02-29 MED ORDER — FLUCONAZOLE 150 MG PO TABS: 150.0000 mg | ORAL_TABLET | Freq: Once | ORAL | Status: DC

## 2016-02-29 MED ORDER — METRONIDAZOLE 500 MG PO TABS
500.0000 mg | ORAL_TABLET | Freq: Two times a day (BID) | ORAL | Status: DC
Start: 2016-02-29 — End: 2016-07-31

## 2016-03-02 ENCOUNTER — Other Ambulatory Visit: Payer: Self-pay | Admitting: Obstetrics

## 2016-03-02 LAB — PAP IG AND HPV HIGH-RISK
HPV, high-risk: POSITIVE — AB
PAP SMEAR COMMENT: 0

## 2016-03-04 ENCOUNTER — Telehealth (HOSPITAL_COMMUNITY): Payer: Self-pay | Admitting: *Deleted

## 2016-03-19 NOTE — Telephone Encounter (Signed)
See telephone note for this encounter.

## 2016-05-28 ENCOUNTER — Ambulatory Visit: Payer: Medicaid Other | Admitting: Obstetrics

## 2016-06-01 ENCOUNTER — Ambulatory Visit: Payer: Medicaid Other | Admitting: Obstetrics

## 2016-07-31 ENCOUNTER — Other Ambulatory Visit: Payer: Self-pay | Admitting: Obstetrics

## 2016-07-31 DIAGNOSIS — N760 Acute vaginitis: Principal | ICD-10-CM

## 2016-07-31 DIAGNOSIS — B9689 Other specified bacterial agents as the cause of diseases classified elsewhere: Secondary | ICD-10-CM

## 2016-08-26 ENCOUNTER — Ambulatory Visit (INDEPENDENT_AMBULATORY_CARE_PROVIDER_SITE_OTHER): Payer: Medicaid Other | Admitting: Obstetrics & Gynecology

## 2016-08-26 ENCOUNTER — Encounter: Payer: Self-pay | Admitting: Obstetrics & Gynecology

## 2016-08-26 VITALS — BP 132/84 | HR 90 | Wt 305.0 lb

## 2016-08-26 DIAGNOSIS — N939 Abnormal uterine and vaginal bleeding, unspecified: Secondary | ICD-10-CM | POA: Diagnosis not present

## 2016-08-26 DIAGNOSIS — N764 Abscess of vulva: Secondary | ICD-10-CM

## 2016-08-26 DIAGNOSIS — Z3202 Encounter for pregnancy test, result negative: Secondary | ICD-10-CM | POA: Diagnosis not present

## 2016-08-26 DIAGNOSIS — Z3046 Encounter for surveillance of implantable subdermal contraceptive: Secondary | ICD-10-CM | POA: Diagnosis not present

## 2016-08-26 LAB — POCT URINE PREGNANCY: Preg Test, Ur: NEGATIVE

## 2016-08-26 MED ORDER — SULFAMETHOXAZOLE-TRIMETHOPRIM 800-160 MG PO TABS: 1.0000 | ORAL_TABLET | Freq: Two times a day (BID) | ORAL | 0 refills | Status: DC

## 2016-08-26 NOTE — Patient Instructions (Addendum)
Skin Abscess A skin abscess is an infected area on or under your skin that contains pus and other material. An abscess can happen almost anywhere on your body. Some abscesses break open (rupture) on their own. Most continue to get worse unless they are treated. The infection can spread deeper into the body and into your blood, which can make you feel sick. Treatment usually involves draining the abscess. Follow these instructions at home: Abscess Care  If you have an abscess that has not drained, place a warm, clean, wet washcloth over the abscess several times a day. Do this as told by your doctor.  Follow instructions from your doctor about how to take care of your abscess. Make sure you:  Cover the abscess with a bandage (dressing).  Change your bandage or gauze as told by your doctor.  Wash your hands with soap and water before you change the bandage or gauze. If you cannot use soap and water, use hand sanitizer.  Check your abscess every day for signs that the infection is getting worse. Check for:  More redness, swelling, or pain.  More fluid or blood.  Warmth.  More pus or a bad smell. Medicines  Take over-the-counter and prescription medicines only as told by your doctor.  If you were prescribed an antibiotic medicine, take it as told by your doctor. Do not stop taking the antibiotic even if you start to feel better. General instructions  To avoid spreading the infection:  Do not share personal care items, towels, or hot tubs with others.  Avoid making skin-to-skin contact with other people.  Keep all follow-up visits as told by your doctor. This is important. Contact a doctor if:  You have more redness, swelling, or pain around your abscess.  You have more fluid or blood coming from your abscess.  Your abscess feels warm when you touch it.  You have more pus or a bad smell coming from your abscess.  You have a fever.  Your muscles ache.  You have  chills.  You feel sick. Get help right away if:  You have very bad (severe) pain.  You see red streaks on your skin spreading away from the abscess. This information is not intended to replace advice given to you by your health care provider. Make sure you discuss any questions you have with your health care provider. Document Released: 02/10/2008 Document Revised: 04/19/2016 Document Reviewed: 07/03/2015 Elsevier Interactive Patient Education  2017 [employer]  Intrauterine Device Information Introduction An intrauterine device (IUD) is a medical device that gets inserted into the uterus to prevent pregnancy. It is a small, T-shaped device that has one or two nylon strings hanging down from it. The strings hang out of the lower part of the uterus (cervix) to allow for future IUD removal. There are two types of IUDs available:  Copper IUD. This type of IUD has copper wire wrapped around it. A copper IUD may last up to 10 years.  Hormone IUD. This type of IUD is made of plastic and contains the hormone progestin (synthetic progesterone). A hormone IUD may last 3 to 5 years. IUDs are inserted through the vagina and placed into the uterus with a minor medical procedure. How does the IUD work? Copper in the copper IUD prevents pregnancy by making the uterus and fallopian tubes produce a fluid that kills sperm. Synthetic progesterone in hormonal IUD prevents pregnancy by:  Thickening cervical mucus to prevent sperm from entering the uterus.  Thinning the  uterine lining to prevent implantation of a fertilized egg.  Weakening or killing sperm that get into the uterus. What are the advantages of an IUD?  IUDs are highly effective, reversible, long-acting, and low-maintenance.  There are no estrogen-related side effects.  An IUD can be used when breastfeeding.  IUDs are not associated with weight gain.  Advantages of the copper IUD are that:  It works immediately after  insertion.  It does not interfere with your body's natural hormones.  It can be used for 10 years.  Advantages of the hormone IUD are that:  If it is inserted within 7 days of your period starting, it works immediately after insertion. If the hormone IUD is inserted at any other time in your cycle, you will need to use a backup method of birth control for 7 days after insertion.  It can make menstrual periods lighter.  It can decrease menstrual cramping.  It can be used for 3 or 5 years. What are the disadvantages of an IUD?  The hormone IUD may cause irregular menstrual bleeding for a period of time after insertion.  The copper IUD can make your menstrual flow heavier and more painful.  You may experience some pain during insertion, and cramping and vaginal bleeding after insertion. How is the IUD removed? Is the IUD right for me?  Your health care provider will make sure you are a good candidate for an IUD and will discuss side effects with you. This information is not intended to replace advice given to you by your health care provider. Make sure you discuss any questions you have with your health care provider. Document Released: 07/28/2004 Document Revised: 01/30/2016 Document Reviewed: 02/12/2013  2017 Elsevier

## 2016-08-26 NOTE — Progress Notes (Signed)
Patient reports she has been some spotting- she does have a monthly cycle with the Nexplanon. Patient wants to conserve her fertility- so she is not ready for permanent sterilization. Patient also has an area of concern on vagina.

## 2016-08-26 NOTE — Progress Notes (Signed)
Subjective:     Patient ID: Sydney Griffin, female   DOB: 04/04/1981, 35 y.o.   MRN: [id number] Cc: wants Nexplanon removed, vulvar swelling and pain [id number] Patient's last menstrual period was 08/15/2016 (exact date). Nexplanon for 39 months, requests removal. For a few days she has had vulvar pain and swelling after pulling a hair.  Allergies  Allergen Reactions  . Aspirin Shortness Of Breath  . Dilaudid [Hydromorphone Hcl] Other (See Comments)    Pt says medication causes her to shake uncontrollably.  . Ibuprofen     Gi upset  . Oxycodone     Headache  . Tramadol Nausea Only and Other (See Comments)    Causes headache.  . Vicodin [Hydrocodone-Acetaminophen] Other (See Comments)    Causes headache.   Current Outpatient Prescriptions on File Prior to Visit  Medication Sig Dispense Refill  . acetaminophen (TYLENOL) 325 MG tablet Take 325 mg by mouth every 6 (six) hours as needed for headache (headache).     . butalbital-acetaminophen-caffeine (FIORICET, ESGIC) 50-325-40 MG tablet Take by mouth 2 (two) times daily as needed for headache.    . labetalol (NORMODYNE) 200 MG tablet Take 200 mg by mouth 2 (two) times daily.    Marland Kitchen. oxyCODONE-acetaminophen (PERCOCET) 10-325 MG tablet Take 1 tablet by mouth every 4 (four) hours as needed for pain.    Marland Kitchen. triamterene-hydrochlorothiazide (DYAZIDE) 50-25 MG per capsule Take 1 capsule by mouth every morning.     No current facility-administered medications on file prior to visit.      Review of Systems  Constitutional: Negative.   Genitourinary: Positive for vaginal bleeding (spotting) and vaginal pain (left vulva).       Objective:   Physical Exam  Genitourinary:  Genitourinary Comments: Left vulva upper aspect 1.5X3 cm swelling c/w cutaneous abscess   Patient given informed consent for removal of her Nexplanon, time out was performed.  Signed copy in the chart.  Appropriate time out taken. Site identified.  Area prepped in usual  sterile fashon. One cc of 1% lidocaine was used to anesthetize the area at the distal end of the implant. A small stab incision was made right beside the implant on the distal portion.  The  rod was grasped using hemostats and removed without difficulty.  There was less than 3 cc blood loss. There were no complications.  A pressure bandage was applied to reduce any bruising.  The patient tolerated the procedure well and was given post procedure instructions. Complications: None  Instructions:  The patient was instructed to remove the dressing in 24 hours and that some bruising is to be expected.  She was advised to use over the counter analgesics as needed for any pain at the site.  She is to keep the area dry for 24 hours and to call if her hand or arm becomes cold, numb, or blue.    Assessment:     Nexplanon removal Vulvar abscess    Plan:     Bactrim DS 10 days Report if sx worsen, f/u in [facility] for emergency RTC 2 weeks for Mirena, recommend abstinence until f/u  Adam PhenixJames G Omayra Tulloch, MD 08/26/2016

## 2016-09-22 ENCOUNTER — Other Ambulatory Visit (HOSPITAL_COMMUNITY)
Admission: RE | Admit: 2016-09-22 | Discharge: 2016-09-22 | Disposition: A | Discharge status: Home / Self Care | Payer: Medicaid Other | Source: Ambulatory Visit | Attending: Obstetrics & Gynecology | Admitting: Obstetrics & Gynecology

## 2016-09-22 ENCOUNTER — Telehealth: Payer: Self-pay | Admitting: *Deleted

## 2016-09-22 ENCOUNTER — Ambulatory Visit (INDEPENDENT_AMBULATORY_CARE_PROVIDER_SITE_OTHER): Payer: Medicaid Other | Admitting: Obstetrics & Gynecology

## 2016-09-22 ENCOUNTER — Encounter: Payer: Self-pay | Admitting: Obstetrics & Gynecology

## 2016-09-22 DIAGNOSIS — N920 Excessive and frequent menstruation with regular cycle: Secondary | ICD-10-CM | POA: Insufficient documentation

## 2016-09-22 DIAGNOSIS — Z30015 Encounter for initial prescription of vaginal ring hormonal contraceptive: Secondary | ICD-10-CM | POA: Insufficient documentation

## 2016-09-22 MED ORDER — ETONOGESTREL-ETHINYL ESTRADIOL 0.12-0.015 MG/24HR VA RING: VAGINAL_RING | VAGINAL | 12 refills | Status: DC

## 2016-09-22 NOTE — Patient Instructions (Signed)
Ethinyl Estradiol; Etonogestrel vaginal ring What is this medicine? ETHINYL ESTRADIOL; ETONOGESTREL (ETH in il es tra DYE ole; et oh noe JES trel) vaginal ring is a flexible, vaginal ring used as a contraceptive (birth control method). This medicine combines two types of female hormones, an estrogen and a progestin. This ring is used to prevent ovulation and pregnancy. Each ring is effective for one month. This medicine may be used for other purposes; ask your health care provider or pharmacist if you have questions. COMMON BRAND NAME(S): NuvaRing What should I tell my health care provider before I take this medicine? They need to know if you have or ever had any of these conditions: -abnormal vaginal bleeding -blood vessel disease or blood clots -breast, cervical, endometrial, ovarian, liver, or uterine cancer -diabetes -gallbladder disease -heart disease or recent heart attack -high blood pressure -high cholesterol -kidney disease -liver disease -migraine headaches -stroke -systemic lupus erythematosus (SLE) -tobacco smoker -an unusual or allergic reaction to estrogens, progestins, other medicines, foods, dyes, or preservatives -pregnant or trying to get pregnant -breast-feeding How should I use this medicine? Insert the ring into your vagina as directed. Follow the directions on the prescription label. The ring will remain place for 3 weeks and is then removed for a 1-week break. A new ring is inserted 1 week after the last ring was removed, on the same day of the week. Check often to make sure the ring is still in place, especially before and after sexual intercourse. If the ring was out of the vagina for an unknown amount of time, you may not be protected from pregnancy. Perform a pregnancy test and call your doctor. Do not use more often than directed. A patient package insert for the product will be given with each prescription and refill. Read this sheet carefully each time. The  sheet may change frequently. Contact your pediatrician regarding the use of this medicine in children. Special care may be needed. This medicine has been used in female children who have started having menstrual periods. Overdosage: If you think you have taken too much of this medicine contact a poison control center or emergency room at once. NOTE: This medicine is only for you. Do not share this medicine with others. What if I miss a dose? You will need to replace your vaginal ring once a month as directed. If the ring should slip out, or if you leave it in longer or shorter than you should, contact your health care professional for advice. What may interact with this medicine? Do not take this medicine with the following medication: -dasabuvir; ombitasvir; paritaprevir; ritonavir -ombitasvir; paritaprevir; ritonavir This medicine may also interact with the following medications: -acetaminophen -antibiotics or medicines for infections, especially rifampin, rifabutin, rifapentine, and griseofulvin, and possibly penicillins or tetracyclines -aprepitant -ascorbic acid (vitamin C) -atorvastatin -barbiturate medicines, such as phenobarbital -bosentan -carbamazepine -caffeine -clofibrate -cyclosporine -dantrolene -doxercalciferol -felbamate -grapefruit juice -hydrocortisone -medicines for anxiety or sleeping problems, such as diazepam or temazepam -medicines for diabetes, including pioglitazone -modafinil -mycophenolate -nefazodone -oxcarbazepine -phenytoin -prednisolone -ritonavir or other medicines for HIV infection or AIDS -rosuvastatin -selegiline -soy isoflavones supplements -St. John's wort -tamoxifen or raloxifene -theophylline -thyroid hormones -topiramate -warfarin This list may not describe all possible interactions. Give your health care provider a list of all the medicines, herbs, non-prescription drugs, or dietary supplements you use. Also tell them if you smoke,  drink alcohol, or use illegal drugs. Some items may interact with your medicine. What should I watch for while using   this medicine? Visit your doctor or health care professional for regular checks on your progress. You will need a regular breast and pelvic exam and Pap smear while on this medicine. Use an additional method of contraception during the first cycle that you use this ring. Do not use a diaphragm or female condom, as the ring can interfere with these birth control methods and their proper placement. If you have any reason to think you are pregnant, stop using this medicine right away and contact your doctor or health care professional. If you are using this medicine for hormone related problems, it may take several cycles of use to see improvement in your condition. Smoking increases the risk of getting a blood clot or having a stroke while you are using hormonal birth control, especially if you are more than 35 years old. You are strongly advised not to smoke. This medicine can make your body retain fluid, making your fingers, hands, or ankles swell. Your blood pressure can go up. Contact your doctor or health care professional if you feel you are retaining fluid. This medicine can make you more sensitive to the sun. Keep out of the sun. If you cannot avoid being in the sun, wear protective clothing and use sunscreen. Do not use sun lamps or tanning beds/booths. If you wear contact lenses and notice visual changes, or if the lenses begin to feel uncomfortable, consult your eye care specialist. In some women, tenderness, swelling, or minor bleeding of the gums may occur. Notify your dentist if this happens. Brushing and flossing your teeth regularly may help limit this. See your dentist regularly and inform your dentist of the medicines you are taking. If you are going to have elective surgery, you may need to stop using this medicine before the surgery. Consult your health care professional  for advice. This medicine does not protect you against HIV infection (AIDS) or any other sexually transmitted diseases. What side effects may I notice from receiving this medicine? Side effects that you should report to your doctor or health care professional as soon as possible: -breast tissue changes or discharge -changes in vaginal bleeding during your period or between your periods -chest pain -coughing up blood -dizziness or fainting spells -headaches or migraines -leg, arm or groin pain -severe or sudden headaches -stomach pain (severe) -sudden shortness of breath -sudden loss of coordination, especially on one side of the body -speech problems -symptoms of vaginal infection like itching, irritation or unusual discharge -tenderness in the upper abdomen -vomiting -weakness or numbness in the arms or legs, especially on one side of the body -yellowing of the eyes or skin Side effects that usually do not require medical attention (report to your doctor or health care professional if they continue or are bothersome): -breakthrough bleeding and spotting that continues beyond the 3 initial cycles of pills -breast tenderness -mood changes, anxiety, depression, frustration, anger, or emotional outbursts -increased sensitivity to sun or ultraviolet light -nausea -skin rash, acne, or brown spots on the skin -weight gain (slight) This list may not describe all possible side effects. Call your doctor for medical advice about side effects. You may report side effects to FDA at 1-800-FDA-1088. Where should I keep my medicine? Keep out of the reach of children. Store at room temperature between 15 and 30 degrees C (59 and 86 degrees F) for up to 4 months. The product will expire after 4 months. Protect from light. Throw away any unused medicine after the expiration date. NOTE: This   sheet is a summary. It may not cover all possible information. If you have questions about this medicine, talk  to your doctor, pharmacist, or health care provider.  2017 Elsevier/Gold Standard (2016-05-01 17:00:31)  

## 2016-09-22 NOTE — Progress Notes (Signed)
Subjective:     Patient ID: Sydney Griffin, female   DOB: [birth year], 36 y.o.   MRN: [id number] Cc: wants birth control and cycle control  HPI [id number] Patient's last menstrual period was 09/08/2016. States she wants lighter periods and less pain, but does not want OCP or Mirena.  Allergies  Allergen Reactions  . Aspirin Shortness Of Breath  . Dilaudid [Hydromorphone Hcl] Other (See Comments)    Pt says medication causes her to shake uncontrollably.  . Ibuprofen     Gi upset  . Oxycodone     Headache  . Tramadol Nausea Only and Other (See Comments)    Causes headache.  . Vicodin [Hydrocodone-Acetaminophen] Other (See Comments)    Causes headache.   Current Outpatient Prescriptions on File Prior to Visit  Medication Sig Dispense Refill  . acetaminophen (TYLENOL) 325 MG tablet Take 325 mg by mouth every 6 (six) hours as needed for headache (headache).     . butalbital-acetaminophen-caffeine (FIORICET, ESGIC) 50-325-40 MG tablet Take by mouth 2 (two) times daily as needed for headache.    . labetalol (NORMODYNE) 200 MG tablet Take 200 mg by mouth 2 (two) times daily.    Marland Kitchen. oxyCODONE-acetaminophen (PERCOCET) 10-325 MG tablet Take 1 tablet by mouth every 4 (four) hours as needed for pain.    Marland Kitchen. sulfamethoxazole-trimethoprim (BACTRIM DS,SEPTRA DS) 800-160 MG tablet Take 1 tablet by mouth 2 (two) times daily. 20 tablet 0  . triamterene-hydrochlorothiazide (DYAZIDE) 50-25 MG per capsule Take 1 capsule by mouth every morning.     No current facility-administered medications on file prior to visit.      Review of Systems  Genitourinary: Positive for menstrual problem, pelvic pain (menstrual cramps) and vaginal discharge (suspects BV).       Objective:   Physical Exam  Constitutional: She is oriented to person, place, and time. She appears well-developed. No distress.  Pulmonary/Chest: Effort normal.  Neurological: She is alert and oriented to person, place, and time.  Psychiatric:  She has a normal mood and affect. Her behavior is normal.   Blood pressure 129/88, pulse 92, weight 300 lb (136.1 kg), last menstrual period 09/08/2016.     Assessment:     H/O menorrhagia, dysmenorrhea Discussed contraceptive methods and cycle control  Possible BV Plan:     Wants to try NuvaRing. Information and Rx sent RTC 3 months Patient obtained wet prep specimen  Adam PhenixJames G Leilyn Frayre, MD 09/22/2016

## 2016-09-22 NOTE — Telephone Encounter (Signed)
Pt called to office questioning if she wants to get IUD as scheduled today.  Pt states that she has had Lupron in the past and did well with no cycle. Pt also states that she thinks she may have bacterial infection.  Pt was advised on BV and ways to try to prevent. Pt was advised to keep appt as scheduled in order to discuss BC, Lupron and have wet prep for d/c. Pt will keep appt today.

## 2016-09-22 NOTE — Progress Notes (Signed)
Patient is here for an IUD-Mirena, however, she says she no longer wants it and would prefer Lupron.

## 2016-09-24 LAB — CERVICOVAGINAL ANCILLARY ONLY
BACTERIAL VAGINITIS: NEGATIVE
Candida vaginitis: NEGATIVE

## 2016-09-25 ENCOUNTER — Telehealth: Payer: Self-pay | Admitting: *Deleted

## 2016-09-25 NOTE — Telephone Encounter (Signed)
Pt called office for lab results from last visit.  Return call to pt. Pt made aware that results are negative.

## 2016-10-07 ENCOUNTER — Encounter: Payer: Self-pay | Admitting: Obstetrics & Gynecology

## 2016-10-20 ENCOUNTER — Emergency Department (HOSPITAL_COMMUNITY)
Admission: EM | Admit: 2016-10-20 | Discharge: 2016-10-20 | Disposition: A | Discharge status: Home / Self Care | Payer: Medicaid Other | Attending: Emergency Medicine | Admitting: Emergency Medicine

## 2016-10-20 ENCOUNTER — Encounter (HOSPITAL_COMMUNITY): Payer: Self-pay

## 2016-10-20 ENCOUNTER — Emergency Department (HOSPITAL_COMMUNITY): Payer: Medicaid Other

## 2016-10-20 DIAGNOSIS — I10 Essential (primary) hypertension: Secondary | ICD-10-CM | POA: Diagnosis not present

## 2016-10-20 DIAGNOSIS — M545 Low back pain: Secondary | ICD-10-CM | POA: Insufficient documentation

## 2016-10-20 DIAGNOSIS — R072 Precordial pain: Secondary | ICD-10-CM | POA: Diagnosis not present

## 2016-10-20 DIAGNOSIS — M25561 Pain in right knee: Secondary | ICD-10-CM | POA: Insufficient documentation

## 2016-10-20 DIAGNOSIS — J45909 Unspecified asthma, uncomplicated: Secondary | ICD-10-CM | POA: Insufficient documentation

## 2016-10-20 DIAGNOSIS — Y999 Unspecified external cause status: Secondary | ICD-10-CM | POA: Diagnosis not present

## 2016-10-20 DIAGNOSIS — M546 Pain in thoracic spine: Secondary | ICD-10-CM | POA: Diagnosis not present

## 2016-10-20 DIAGNOSIS — Y939 Activity, unspecified: Secondary | ICD-10-CM | POA: Diagnosis not present

## 2016-10-20 DIAGNOSIS — F17210 Nicotine dependence, cigarettes, uncomplicated: Secondary | ICD-10-CM | POA: Insufficient documentation

## 2016-10-20 DIAGNOSIS — Y92410 Unspecified street and highway as the place of occurrence of the external cause: Secondary | ICD-10-CM | POA: Insufficient documentation

## 2016-10-20 LAB — POC URINE PREG, ED: PREG TEST UR: NEGATIVE

## 2016-10-20 MED ORDER — METHOCARBAMOL 500 MG PO TABS: 500.0000 mg | ORAL_TABLET | Freq: Two times a day (BID) | ORAL | 0 refills | Status: DC

## 2016-10-20 MED ORDER — OXYCODONE-ACETAMINOPHEN 5-325 MG PO TABS
1.0000 | ORAL_TABLET | Freq: Once | ORAL | Status: AC
Start: 2016-10-20 — End: 2016-10-20
  Administered 2016-10-20: 1 via ORAL
  Filled 2016-10-20: qty 1

## 2016-10-20 MED ORDER — METHOCARBAMOL 500 MG PO TABS
1000.0000 mg | ORAL_TABLET | Freq: Once | ORAL | Status: AC
  Administered 2016-10-20: 1000 mg via ORAL
  Filled 2016-10-20: qty 2

## 2016-10-20 MED ORDER — LIDOCAINE 5 % EX PTCH: 1.0000 | MEDICATED_PATCH | CUTANEOUS | 0 refills | Status: DC
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
## 2016-10-20 NOTE — Discharge Instructions (Signed)
Expect your soreness to increase over the next 2-3 days. Take it easy, but do not lay around too much as this may make any stiffness worse.  Pain management: You may take Tylenol for pain due to your noted allergies to NSAIDS. You may alternatively take your home pain medications.  Muscle relaxer: Robaxin is a muscle relaxer and may help loosen stiff muscles. Do not take the Robaxin while driving or performing other dangerous activities.   Lidocaine patches: These are available via either prescription or over-the-counter. The over-the-counter option may be more economical one and are likely just as effective. There are multiple over-the-counter brands, such as Salonpas.  Exercises: Be sure to perform the attached exercises starting with three times a week and working up to performing them daily. This is an essential part of preventing long term problems.  Follow up with a primary care provider for any future management of these complaints.

## 2016-10-20 NOTE — ED Notes (Signed)
Pt ambulatory and independent at discharge.  Verbalized understanding of discharge instructions 

## 2016-10-22 ENCOUNTER — Telehealth: Payer: Self-pay

## 2016-10-22 DIAGNOSIS — N939 Abnormal uterine and vaginal bleeding, unspecified: Secondary | ICD-10-CM

## 2016-10-22 MED ORDER — MEDROXYPROGESTERONE ACETATE 10 MG PO TABS: 10.0000 mg | ORAL_TABLET | Freq: Every day | ORAL | 0 refills | Status: DC

## 2016-10-22 NOTE — Telephone Encounter (Signed)
Patient called in and stated that she has been bleeding for about 2 weeks, ever since she got her NuvaRing. Patient stated that she removed it and is now wanting a rx to make the bleeding stop. Sent rx with provider's approval.

## 2016-11-06 ENCOUNTER — Other Ambulatory Visit: Payer: Self-pay | Admitting: Obstetrics & Gynecology

## 2016-11-06 DIAGNOSIS — N764 Abscess of vulva: Secondary | ICD-10-CM

## 2016-11-17 NOTE — Telephone Encounter (Signed)
Patient is calling to check on her refill request- please advise

## 2016-11-18 ENCOUNTER — Other Ambulatory Visit: Payer: Self-pay | Admitting: *Deleted

## 2016-11-18 DIAGNOSIS — N760 Acute vaginitis: Principal | ICD-10-CM

## 2016-11-18 DIAGNOSIS — B9689 Other specified bacterial agents as the cause of diseases classified elsewhere: Secondary | ICD-10-CM

## 2016-11-18 MED ORDER — METRONIDAZOLE 0.75 % VA GEL: 1.0000 | Freq: Every day | VAGINAL | 0 refills | Status: DC

## 2016-11-18 NOTE — Telephone Encounter (Signed)
Call to patient to let her know that her Rx is filled- she states she does not need the Rx for abscess but she has BV symptoms and would like vaginal treatment for that. Per protocol told patient I could treat her this time but could not give refills on that medication and if she had reoccurrence she would need appointment . Notification sent to provider

## 2017-10-18 ENCOUNTER — Other Ambulatory Visit: Payer: Self-pay

## 2017-10-18 ENCOUNTER — Inpatient Hospital Stay (HOSPITAL_COMMUNITY)
Admission: AD | Admit: 2017-10-18 | Discharge: 2017-10-18 | Disposition: A | Discharge status: Home / Self Care | Payer: Medicaid Other | Source: Ambulatory Visit | Attending: Obstetrics and Gynecology | Admitting: Obstetrics and Gynecology

## 2017-10-18 ENCOUNTER — Encounter (HOSPITAL_COMMUNITY): Payer: Self-pay | Admitting: *Deleted

## 2017-10-18 ENCOUNTER — Inpatient Hospital Stay (HOSPITAL_COMMUNITY): Payer: Medicaid Other

## 2017-10-18 DIAGNOSIS — O209 Hemorrhage in early pregnancy, unspecified: Secondary | ICD-10-CM | POA: Diagnosis present

## 2017-10-18 DIAGNOSIS — Z3A01 Less than 8 weeks gestation of pregnancy: Secondary | ICD-10-CM | POA: Insufficient documentation

## 2017-10-18 DIAGNOSIS — F17210 Nicotine dependence, cigarettes, uncomplicated: Secondary | ICD-10-CM | POA: Diagnosis not present

## 2017-10-18 DIAGNOSIS — Z888 Allergy status to other drugs, medicaments and biological substances status: Secondary | ICD-10-CM | POA: Diagnosis not present

## 2017-10-18 DIAGNOSIS — O00101 Right tubal pregnancy without intrauterine pregnancy: Secondary | ICD-10-CM | POA: Diagnosis not present

## 2017-10-18 DIAGNOSIS — O99332 Smoking (tobacco) complicating pregnancy, second trimester: Secondary | ICD-10-CM | POA: Insufficient documentation

## 2017-10-18 DIAGNOSIS — Z886 Allergy status to analgesic agent status: Secondary | ICD-10-CM | POA: Insufficient documentation

## 2017-10-18 DIAGNOSIS — Z79899 Other long term (current) drug therapy: Secondary | ICD-10-CM | POA: Insufficient documentation

## 2017-10-18 DIAGNOSIS — Z885 Allergy status to narcotic agent status: Secondary | ICD-10-CM | POA: Diagnosis not present

## 2017-10-18 LAB — COMPREHENSIVE METABOLIC PANEL
ALK PHOS: 66 U/L (ref 38–126)
ALT: 33 U/L (ref 14–54)
AST: 30 U/L (ref 15–41)
Albumin: 3.5 g/dL (ref 3.5–5.0)
Anion gap: 8 (ref 5–15)
BILIRUBIN TOTAL: 0.7 mg/dL (ref 0.3–1.2)
BUN: 14 mg/dL (ref 6–20)
CALCIUM: 8.4 mg/dL — AB (ref 8.9–10.3)
CO2: 24 mmol/L (ref 22–32)
CREATININE: 0.96 mg/dL (ref 0.44–1.00)
Chloride: 104 mmol/L (ref 101–111)
GFR calc Af Amer: >60 mL/min (ref >60)
Glucose, Bld: 102 mg/dL — ABNORMAL HIGH (ref 65–99)
Potassium: 3.4 mmol/L — ABNORMAL LOW (ref 3.5–5.1)
Sodium: 136 mmol/L (ref 135–145)
TOTAL PROTEIN: 6.8 g/dL (ref 6.5–8.1)

## 2017-10-18 LAB — URINALYSIS, ROUTINE W REFLEX MICROSCOPIC
BILIRUBIN URINE: NEGATIVE
GLUCOSE, UA: NEGATIVE mg/dL
Hgb urine dipstick: NEGATIVE
Ketones, ur: NEGATIVE mg/dL
Leukocytes, UA: NEGATIVE
Nitrite: NEGATIVE
Protein, ur: NEGATIVE mg/dL
Specific Gravity, Urine: 1.023 (ref 1.005–1.030)
pH: 6 (ref 5.0–8.0)

## 2017-10-18 LAB — HCG, QUANTITATIVE, PREGNANCY: hCG, Beta Chain, Quant, S: 3330 m[IU]/mL — ABNORMAL HIGH (ref <5)

## 2017-10-18 LAB — CBC
HCT: 38.8 % (ref 36.0–46.0)
Hemoglobin: 13.5 g/dL (ref 12.0–15.0)
MCH: 32.8 pg (ref 26.0–34.0)
MCHC: 34.8 g/dL (ref 30.0–36.0)
MCV: 94.2 fL (ref 78.0–100.0)
PLATELETS: 257 10*3/uL (ref 150–400)
RBC: 4.12 MIL/uL (ref 3.87–5.11)
RDW: 12.9 % (ref 11.5–15.5)
WBC: 14.6 10*3/uL — ABNORMAL HIGH (ref 4.0–10.5)

## 2017-10-18 LAB — WET PREP, GENITAL
Clue Cells Wet Prep HPF POC: NONE SEEN
Sperm: NONE SEEN
Trich, Wet Prep: NONE SEEN
Yeast Wet Prep HPF POC: NONE SEEN

## 2017-10-18 LAB — POCT PREGNANCY, URINE
PREG TEST UR: POSITIVE — AB
Preg Test, Ur: POSITIVE — AB

## 2017-10-18 MED ORDER — METHOTREXATE INJECTION FOR WOMEN'S HOSPITAL
50.0000 mg/m2 | Freq: Once | INTRAMUSCULAR | Status: AC
Start: 2017-10-18 — End: 2017-10-18
  Administered 2017-10-18: 120 mg via INTRAMUSCULAR
  Filled 2017-10-18: qty 2.4

## 2017-10-18 NOTE — Discharge Instructions (Signed)
Ectopic Pregnancy °An ectopic pregnancy happens when a fertilized egg grows outside the uterus. A pregnancy cannot live outside of the uterus. This problem often happens in the fallopian tube. It is often caused by damage to the fallopian tube. °If this problem is found early, you may be treated with medicine. If your tube tears or bursts open (ruptures), you will bleed inside. This is an emergency. You will need surgery. Get help right away. °What are the signs or symptoms? °You may have normal pregnancy symptoms at first. These include: °· Missing your period. °· Feeling sick to your stomach (nauseous). °· Being tired. °· Having tender breasts. ° °Then, you may start to have symptoms that are not normal. These include: °· Pain with sex (intercourse). °· Bleeding from the vagina. This includes light bleeding (spotting). °· Belly (abdomen) or lower belly cramping or pain. This may be felt on one side. °· A fast heartbeat (pulse). °· Passing out (fainting) after going poop (bowel movement). ° °If your tube tears, you may have symptoms such as: °· Really bad pain in the belly or lower belly. This happens suddenly. °· Dizziness. °· Passing out. °· Shoulder pain. ° °Get help right away if: °You have any of these symptoms. This is an emergency. °This information is not intended to replace advice given to you by your health care provider. Make sure you discuss any questions you have with your health care provider. °Document Released: 11/20/2008 Document Revised: 01/30/2016 Document Reviewed: 04/05/2013 °Elsevier Interactive Patient Education © 2017 Else[employer] Inc. ° °

## 2017-10-18 NOTE — MAU Note (Signed)
Discharge instructions reviewed with patient, ectopic patient education instructions given to patient, pt. Verbalized understanding. E-signature pad not operating, pt. Signed hard copy, hard copy placed in chart.

## 2017-10-18 NOTE — MAU Provider Note (Signed)
History  CSN: [id number] Arrival date and time: 10/18/17 1340    Chief Complaint  Patient presents with  . Vaginal Bleeding    HPI: Sydney Griffin is a 37 y.o. [phone]  who presents to maternity admissions reporting spotty vaginal bleeding for 2 weeks.  The patient reports that usually she is a heavy bleeder but she had minimal bleeding at the onset of her period 2 weeks ago.  Since then she has continued to have spotting and very minor abdominal pain located in the RLQ and some minor nausea .  The patient took 3 pregnancy tests over the weekend which were all positive.  Because of the positive pregnancy tests and continued bleeding she came to the MAU worried about an ectopic pregnancy, which is part of her PMHx.   Denies contractions or leakage of fluid.  Also denies any abnormal vaginal discharge, fevers, chills, malaise, dysuria, hematuria, urinary frequency, vomiting, diarrhea, RUQ/epigastric pain, dizziness/lighreadhess, or headache.     OB History  Gravida Para Term Preterm AB Living  4 3 3  0 1 3  SAB TAB Ectopic Multiple Live Births      1   3    # Outcome Date GA Lbr Len/2nd Weight Sex Delivery Anes PTL Lv  4 Term 04/20/13 [id number]  3.43 kg (7 lb 9 oz) F CS-LTranv Spinal  LIV  3 Term 03/28/12 [redacted]w[redacted]d  2.55 kg (5 lb 10 oz) F CS-LTranv Spinal  LIV  2 Term 03/08/03 [redacted]w[redacted]d  3.232 kg (7 lb 2 oz) F CS-LTranv EPI  LIV  1 Ectopic              Past Medical History:  Diagnosis Date  . Acid reflux   . Asthma May 2013   info from [facility] states asthma but pt denies and said she had bronchitis, used an Hydrologist while sick and has now lost the inhaler.  Marland Kitchen Dysrhythmia    rare palpitations  . Ectopic pregnancy    Had MTX  . Headache(784.0)   . Hypertension   . Menstrual bleeding problem    Past Surgical History:  Procedure Laterality Date  . CESAREAN SECTION    . CESAREAN SECTION  03/28/2012   Procedure: CESAREAN SECTION;  Surgeon: Antionette Char, MD;  Location: [facility] ORS;  Service:  Gynecology;  Laterality: N/A;  . CESAREAN SECTION N/A 04/20/2013   Procedure: CESAREAN SECTION;  Surgeon: Antionette Char, MD;  Location: [facility] OR[facility];  Service: Obstetrics;  Laterality: N/A;  Repeat Cesarean Section   . DENTAL SURGERY    . DILATION AND CURETTAGE OF UTERUS    . LAPAROSCOPY     Family History  Problem Relation Age of Onset  . Hypertension Mother   . Diabetes Mother   . Diabetes Father   . Hypertension Brother   . Anesthesia problems Neg Hx   . Other Neg Hx    Social History   Socioeconomic History  . Marital status: Single    Spouse name: Not on file  . Number of children: Not on file  . Years of education: Not on file  . Highest education level: Not on file  Social Needs  . Financial resource strain: Not on file  . Food insecurity - worry: Not on file  . Food insecurity - inability: Not on file  . Transportation needs - medical: Not on file  . Transportation needs - non-medical: Not on file  Occupational History  . Not on file  Tobacco Use  . Smoking status: Current  Every Day Smoker    Packs/day: 1.00    Years: 14.00    Pack years: 14.00    Types: Cigarettes  . Smokeless tobacco: Never Used  Substance and Sexual Activity  . Alcohol use: No    Alcohol/week: 0.0 oz  . Drug use: No  . Sexual activity: Yes    Partners: Male  Other Topics Concern  . Not on file  Social History Narrative  . Not on file   Allergies  Allergen Reactions  . Aspirin Shortness Of Breath  . Dilaudid [Hydromorphone Hcl] Other (See Comments)    Pt says medication causes her to shake uncontrollably.  . Ibuprofen     Gi upset  . Tramadol Nausea Only and Other (See Comments)    Causes headache.  . Vicodin [Hydrocodone-Acetaminophen] Other (See Comments)    Causes headache.    Medications Prior to Admission  Medication Sig Dispense Refill Last Dose  . butalbital-acetaminophen-caffeine (FIORICET, ESGIC) 50-325-40 MG tablet Take by mouth 2 (two) times daily as needed for  headache.   10/17/2017 at Unknown time  . oxyCODONE-acetaminophen (PERCOCET) 10-325 MG tablet Take 1 tablet by mouth every 4 (four) hours as needed for pain.   10/18/2017 at Unknown time  . triamterene-hydrochlorothiazide (DYAZIDE) 50-25 MG per capsule Take 1 capsule by mouth every morning.   10/18/2017 at Unknown time  . etonogestrel-ethinyl estradiol (NUVARING) 0.12-0.015 MG/24HR vaginal ring Insert vaginally and leave in place for 3 consecutive weeks, then remove for 1 week. (Patient not taking: Reported on 10/18/2017) 1 each 12 Not Taking at Unknown time  . lidocaine (LIDODERM) 5 % Place 1 patch onto the skin daily. Remove & Discard patch within 12 hours or as directed by MD (Patient not taking: Reported on 10/18/2017) 30 patch 0 Not Taking at Unknown time  . medroxyPROGESTERone (PROVERA) 10 MG tablet Take 1 tablet (10 mg total) by mouth daily. (Patient not taking: Reported on 10/18/2017) 14 tablet 0 Not Taking at Unknown time  . methocarbamol (ROBAXIN) 500 MG tablet Take 1 tablet (500 mg total) by mouth 2 (two) times daily. (Patient not taking: Reported on 10/18/2017) 20 tablet 0 Not Taking at Unknown time  . metroNIDAZOLE (METROGEL VAGINAL) 0.75 % vaginal gel Place 1 Applicatorful vaginally at bedtime. (Patient not taking: Reported on 10/18/2017) 70 g 0 Not Taking at Unknown time  . sulfamethoxazole-trimethoprim (BACTRIM DS,SEPTRA DS) 800-160 MG tablet take 1 tablet by mouth twice a day (Patient not taking: Reported on 10/18/2017) 20 tablet 0 Not Taking at Unknown time    I have reviewed patient's Past Medical Hx, Surgical Hx, Family Hx, Social Hx, medications and allergies.   Review of Systems: Negative except for what is mentioned in HPI.  Physical Exam   Blood pressure 138/84, pulse 94, temperature 98.1 F (36.7 C), temperature source Oral, resp. rate 18, height 5\' 9"  (1.753 m), weight 114.9 kg (253 lb 4 oz).  Constitutional: Well-developed, well-nourished female in no acute distress.  HENT:  [place]/AT, normal oropharynx mucosa. MMM Eyes: normal conjunctivae, no scleral icterus Cardiovascular: normal rate, regular rhythm Respiratory: normal effort, lungs CTAB.  GI: Abd soft, non-tender GU: Neg CVAT. Pelvic: Normal vaginal mucosa without lesions. Cervix pink, visually closed, without lesion, scant white discharge, minor blood visualized during speculum entry, brown red discharge appreciated from the cervical os  MSK: Extremities nontender, no edema Neurologic: Alert and oriented x 4. Psych: Normal mood and affect Skin: warm and dry    MAU Course/MDM:   Nursing notes and VS reviewed.  Patient seen and examined, as noted above.  Labs ordered and were reviewed  US imaging to appreciate possible pregnancy ordered and reviewed  Treatment options reviewed and given  Patient discharge with return precautions  Results reviewed:  Results for orders placed or performed during the hospital encounter of 10/18/17  Wet prep, genital  Result Value Ref Range   Yeast Wet Prep HPF POC NONE SEEN NONE SEEN   Trich, Wet Prep NONE SEEN NONE SEEN   Clue Cells Wet Prep HPF POC NONE SEEN NONE SEEN   WBC, Wet Prep HPF POC MANY (A) NONE SEEN   Sperm NONE SEEN   Urinalysis, Routine w reflex microscopic  Result Value Ref Range   Color, Urine YELLOW YELLOW   APPearance CLEAR CLEAR   Specific Gravity, Urine 1.023 1.005 - 1.030   pH 6.0 5.0 - 8.0   Glucose, UA NEGATIVE NEGATIVE mg/dL   Hgb urine dipstick NEGATIVE NEGATIVE   Bilirubin Urine NEGATIVE NEGATIVE   Ketones, ur NEGATIVE NEGATIVE mg/dL   Protein, ur NEGATIVE NEGATIVE mg/dL   Nitrite NEGATIVE NEGATIVE   Leukocytes, UA NEGATIVE NEGATIVE  CBC  Result Value Ref Range   WBC 14.6 (H) 4.0 - 10.5 K/uL   RBC 4.12 3.87 - 5.11 MIL/uL   Hemoglobin 13.5 12.0 - 15.0 g/dL   HCT 16.1 09.6 - 04.5 %   MCV 94.2 78.0 - 100.0 fL   MCH 32.8 26.0 - 34.0 pg   MCHC 34.8 30.0 - 36.0 g/dL   RDW 40.9 81.1 - 91.4 %   Platelets 257 150 - 400 K/uL  hCG,  quantitative, pregnancy  Result Value Ref Range   hCG, Beta Chain, Quant, S 3,330 (H) <5 mIU/mL  Comprehensive metabolic panel  Result Value Ref Range   Sodium 136 135 - 145 mmol/L   Potassium 3.4 (L) 3.5 - 5.1 mmol/L   Chloride 104 101 - 111 mmol/L   CO2 24 22 - 32 mmol/L   Glucose, Bld 102 (H) 65 - 99 mg/dL   BUN 14 6 - 20 mg/dL   Creatinine, Ser 7.82 0.44 - 1.00 mg/dL   Calcium 8.4 (L) 8.9 - 10.3 mg/dL   Total Protein 6.8 6.5 - 8.1 g/dL   Albumin 3.5 3.5 - 5.0 g/dL   AST 30 15 - 41 U/L   ALT 33 14 - 54 U/L   Alkaline Phosphatase 66 38 - 126 U/L   Total Bilirubin 0.7 0.3 - 1.2 mg/dL   GFR calc non Af Amer >60 >60 mL/min   GFR calc Af Amer >60 >60 mL/min   Anion gap 8 5 - 15  Pregnancy, urine POC  Result Value Ref Range   Preg Test, Ur POSITIVE (A) NEGATIVE  Pregnancy, urine POC  Result Value Ref Range   Preg Test, Ur POSITIVE (A) NEGATIVE    Assessment and Plan  Assessment: 1. Right tubal pregnancy without intrauterine pregnancy   2. Vaginal bleeding in pregnancy, first trimester     Plan: --Administer methotrexate after review of CMP showed peroper renal and liver function --  Patient will return in 4 days to repeat bHCG quant and again on day 7 --Discharge home in stable condition.       Ames Coupe, Medical Student 10/18/2017 3:11 PM

## 2017-10-18 NOTE — MAU Provider Note (Signed)
Chief Complaint: Vaginal Bleeding   None     SUBJECTIVE HPI: Sydney Griffin is a 37 y.o. [id number] at [redacted]w[redacted]d by LMP who presents to maternity admissions reporting vaginal spotting x 2 weeks.  She did not have a period in January prior to this bleeding and usually has regular periods.  Then, this light bleeding started and has continued daily x 2 weeks. It is associated with mild lower abdominal pain mostly on the right side and nausea.  She has not tried any treatments for pain or nausea. There are no other associated symptoms. The patient took 3 pregnancy tests over the weekend which were all positive.  Because of the positive pregnancy tests and continued bleeding she came to the MAU worried about an ectopic pregnancy, which is part of her PMHx.     HPI  Past Medical History:  Diagnosis Date  . Acid reflux   . Asthma May 2013   info from [facility] states asthma but pt denies and said she had bronchitis, used an Hydrologist while sick and has now lost the inhaler.  Marland Kitchen Dysrhythmia    rare palpitations  . Ectopic pregnancy    Had MTX  . Headache(784.0)   . Hypertension   . Menstrual bleeding problem    Past Surgical History:  Procedure Laterality Date  . CESAREAN SECTION    . CESAREAN SECTION  03/28/2012   Procedure: CESAREAN SECTION;  Surgeon: Antionette Char, MD;  Location: [facility];  Service: Gynecology;  Laterality: N/A;  . CESAREAN SECTION N/A 04/20/2013   Procedure: CESAREAN SECTION;  Surgeon: Antionette Char, MD;  Location: [facility];  Service: Obstetrics;  Laterality: N/A;  Repeat Cesarean Section   . DENTAL SURGERY    . DILATION AND CURETTAGE OF UTERUS    . LAPAROSCOPY     Social History   Socioeconomic History  . Marital status: Single    Spouse name: Not on file  . Number of children: Not on file  . Years of education: Not on file  . Highest education level: Not on file  Social Needs  . Financial resource strain: Not on file  . Food insecurity - worry: Not on file  .  Food insecurity - inability: Not on file  . Transportation needs - medical: Not on file  . Transportation needs - non-medical: Not on file  Occupational History  . Not on file  Tobacco Use  . Smoking status: Current Every Day Smoker    Packs/day: 1.00    Years: 14.00    Pack years: 14.00    Types: Cigarettes  . Smokeless tobacco: Never Used  Substance and Sexual Activity  . Alcohol use: No    Alcohol/week: 0.0 oz  . Drug use: No  . Sexual activity: Yes    Partners: Male  Other Topics Concern  . Not on file  Social History Narrative  . Not on file   No current facility-administered medications on file prior to encounter.    Current Outpatient Medications on File Prior to Encounter  Medication Sig Dispense Refill  . butalbital-acetaminophen-caffeine (FIORICET, ESGIC) 50-325-40 MG tablet Take by mouth 2 (two) times daily as needed for headache.    . oxyCODONE-acetaminophen (PERCOCET) 10-325 MG tablet Take 1 tablet by mouth every 4 (four) hours as needed for pain.    Marland Kitchen triamterene-hydrochlorothiazide (DYAZIDE) 50-25 MG per capsule Take 1 capsule by mouth every morning.     Allergies  Allergen Reactions  . Aspirin Shortness Of Breath  . Dilaudid [Hydromorphone  Hcl] Other (See Comments)    Pt says medication causes her to shake uncontrollably.  . Ibuprofen     Gi upset  . Tramadol Nausea Only and Other (See Comments)    Causes headache.  . Vicodin [Hydrocodone-Acetaminophen] Other (See Comments)    Causes headache.    ROS:  Review of Systems  Constitutional: Negative for chills, fatigue and fever.  Respiratory: Negative for shortness of breath.   Cardiovascular: Negative for chest pain.  Gastrointestinal: Positive for nausea. Negative for constipation and vomiting.  Genitourinary: Positive for pelvic pain and vaginal bleeding. Negative for difficulty urinating, dysuria, flank pain, vaginal discharge and vaginal pain.  Neurological: Negative for dizziness and headaches.   Psychiatric/Behavioral: Negative.      I have reviewed patient's Past Medical Hx, Surgical Hx, Family Hx, Social Hx, medications and allergies.   Physical Exam   Patient Vitals for the past 24 hrs:  BP Temp Temp src Pulse Resp Height Weight  10/18/17 1744 129/83 - - 79 - - -  10/18/17 1404 138/84 98.1 F (36.7 C) Oral 94 18 5\' 9"  (1.753 m) 253 lb 4 oz (114.9 kg)  10/18/17 1358 - - - - - - 253 lb 4 oz (114.9 kg)   Constitutional: Well-developed, well-nourished female in no acute distress.  Cardiovascular: normal rate Respiratory: normal effort GI: Abd soft, non-tender. Pos BS x 4 MS: Extremities nontender, no edema, normal ROM Neurologic: Alert and oriented x 4.  GU: Neg CVAT.  PELVIC EXAM: Cervix pink, visually closed, without lesion, scant pink and brown bleeding noted, vaginal walls and external genitalia normal Bimanual exam: Cervix 0/long/high, firm, anterior, neg CMT, uterus nontender, nonenlarged, adnexa without tenderness, enlargement, or mass   LAB RESULTS Results for orders placed or performed during the hospital encounter of 10/18/17 (from the past 24 hour(s))  Urinalysis, Routine w reflex microscopic     Status: None   Collection Time: 10/18/17  1:55 PM  Result Value Ref Range   Color, Urine YELLOW YELLOW   APPearance CLEAR CLEAR   Specific Gravity, Urine 1.023 1.005 - 1.030   pH 6.0 5.0 - 8.0   Glucose, UA NEGATIVE NEGATIVE mg/dL   Hgb urine dipstick NEGATIVE NEGATIVE   Bilirubin Urine NEGATIVE NEGATIVE   Ketones, ur NEGATIVE NEGATIVE mg/dL   Protein, ur NEGATIVE NEGATIVE mg/dL   Nitrite NEGATIVE NEGATIVE   Leukocytes, UA NEGATIVE NEGATIVE  CBC     Status: Abnormal   Collection Time: 10/18/17  2:44 PM  Result Value Ref Range   WBC 14.6 (H) 4.0 - 10.5 K/uL   RBC 4.12 3.87 - 5.11 MIL/uL   Hemoglobin 13.5 12.0 - 15.0 g/dL   HCT 40.9 81.1 - 91.4 %   MCV 94.2 78.0 - 100.0 fL   MCH 32.8 26.0 - 34.0 pg   MCHC 34.8 30.0 - 36.0 g/dL   RDW 78.2 95.6 - 21.3 %    Platelets 257 150 - 400 K/uL  hCG, quantitative, pregnancy     Status: Abnormal   Collection Time: 10/18/17  2:44 PM  Result Value Ref Range   hCG, Beta Chain, Quant, S 3,330 (H) <5 mIU/mL  Comprehensive metabolic panel     Status: Abnormal   Collection Time: 10/18/17  2:44 PM  Result Value Ref Range   Sodium 136 135 - 145 mmol/L   Potassium 3.4 (L) 3.5 - 5.1 mmol/L   Chloride 104 101 - 111 mmol/L   CO2 24 22 - 32 mmol/L   Glucose, Bld 102 (H)  65 - 99 mg/dL   BUN 14 6 - 20 mg/dL   Creatinine, Ser 1.61 0.44 - 1.00 mg/dL   Calcium 8.4 (L) 8.9 - 10.3 mg/dL   Total Protein 6.8 6.5 - 8.1 g/dL   Albumin 3.5 3.5 - 5.0 g/dL   AST 30 15 - 41 U/L   ALT 33 14 - 54 U/L   Alkaline Phosphatase 66 38 - 126 U/L   Total Bilirubin 0.7 0.3 - 1.2 mg/dL   GFR calc non Af Amer >60 >60 mL/min   GFR calc Af Amer >60 >60 mL/min   Anion gap 8 5 - 15  Pregnancy, urine POC     Status: Abnormal   Collection Time: 10/18/17  2:53 PM  Result Value Ref Range   Preg Test, Ur POSITIVE (A) NEGATIVE  Wet prep, genital     Status: Abnormal   Collection Time: 10/18/17  3:40 PM  Result Value Ref Range   Yeast Wet Prep HPF POC NONE SEEN NONE SEEN   Trich, Wet Prep NONE SEEN NONE SEEN   Clue Cells Wet Prep HPF POC NONE SEEN NONE SEEN   WBC, Wet Prep HPF POC MANY (A) NONE SEEN   Sperm NONE SEEN   Pregnancy, urine POC     Status: Abnormal   Collection Time: 10/18/17  6:02 PM  Result Value Ref Range   Preg Test, Ur POSITIVE (A) NEGATIVE       IMAGING US Ob Comp Less 14 Wks  Result Date: 10/18/2017 CLINICAL DATA:  Vaginal bleeding for approximately 3 weeks. Unsure of LMP. Previous ectopic pregnancy. EXAM: OBSTETRIC <14 WK [place] AND TRANSVAGINAL OB US TECHNIQUE: Both transabdominal and transvaginal ultrasound examinations were performed for complete evaluation of the gestation as well as the maternal uterus, adnexal regions, and pelvic cul-de-sac. Transvaginal technique was performed to assess early pregnancy.  COMPARISON:  None. FINDINGS: Intrauterine gestational sac: None Maternal uterus/adnexae: No fibroids identified. Endometrial thickness measures 15 mm. Normal appearance of the left ovary. In the right adnexa, immediately adjacent to the right ovary, there is a mixed echogenicity mass with central cystic focus. This measures 1.9 x 1.3 x 1.2 cm, and is highly suspicious for ectopic pregnancy. No evidence of free fluid. IMPRESSION: No IUP visualized. 1.9 cm right adnexal mass adjacent to the ovary, highly suspicious for ectopic pregnancy. No free fluid identified. Critical Value/emergent results were called by telephone at the time of interpretation on 10/18/2017 at 4:40 pm to Dr. Misty Stanley LEFTWICH-KIRBY , who verbally acknowledged these results. Electronically Signed   By: Myles Rosenthal M.D.   On: 10/18/2017 16:45   US Ob Transvaginal  Result Date: 10/18/2017 CLINICAL DATA:  Vaginal bleeding for approximately 3 weeks. Unsure of LMP. Previous ectopic pregnancy. EXAM: OBSTETRIC <14 WK [place] AND TRANSVAGINAL OB US TECHNIQUE: Both transabdominal and transvaginal ultrasound examinations were performed for complete evaluation of the gestation as well as the maternal uterus, adnexal regions, and pelvic cul-de-sac. Transvaginal technique was performed to assess early pregnancy. COMPARISON:  None. FINDINGS: Intrauterine gestational sac: None Maternal uterus/adnexae: No fibroids identified. Endometrial thickness measures 15 mm. Normal appearance of the left ovary. In the right adnexa, immediately adjacent to the right ovary, there is a mixed echogenicity mass with central cystic focus. This measures 1.9 x 1.3 x 1.2 cm, and is highly suspicious for ectopic pregnancy. No evidence of free fluid. IMPRESSION: No IUP visualized. 1.9 cm right adnexal mass adjacent to the ovary, highly suspicious for ectopic pregnancy. No free fluid identified. Critical Value/emergent  results were called by telephone at the time of interpretation on  10/18/2017 at 4:40 pm to Dr. Misty StanleyLISA LEFTWICH-KIRBY , who verbally acknowledged these results. Electronically Signed   By: Myles RosenthalJohn  Stahl M.D.   On: 10/18/2017 16:45    MAU Management/MDM: Ordered labs and US and reviewed results.  Likely ectopic pregnancy on today's US.  Quant hcg >3000 with no IUP visualized.  Mass adjacent to R ovary suspicious for ectopic.  Consult Dr Jolayne Pantheronstant who reviewed images and results.  Pt reports an increase in pain in her abdomen while in MAU.  Discussed options with pt, including MTX now for likely ectopic and return to MAU for labs in 48 hours.  Waiting and returning for labwork includes the risk of ruptured ectopic and need for surgical management.  Pt selects MTX today.  CMP added to labwork and with normal liver and kidney function, MTX ordered and administered. Pt tolerated well.  D/C home. [facility] Day 7 in [facility]U.   Pt discharged with strict ectopic precautions.  ASSESSMENT 1. Right tubal pregnancy without intrauterine pregnancy   2. Vaginal bleeding in pregnancy, first trimester     PLAN Discharge home Allergies as of 10/18/2017      Reactions   Aspirin Shortness Of Breath   Dilaudid [hydromorphone Hcl] Other (See Comments)   Pt says medication causes her to shake uncontrollably.   Ibuprofen    Gi upset   Tramadol Nausea Only, Other (See Comments)   Causes headache.   Vicodin [hydrocodone-acetaminophen] Other (See Comments)   Causes headache.      Medication List    STOP taking these medications   etonogestrel-ethinyl estradiol 0.12-0.015 MG/24HR vaginal ring Commonly known as:  NUVARING   lidocaine 5 % Commonly known as:  LIDODERM   medroxyPROGESTERone 10 MG tablet Commonly known as:  PROVERA   methocarbamol 500 MG tablet Commonly known as:  ROBAXIN   metroNIDAZOLE 0.75 % vaginal gel Commonly known as:  METROGEL VAGINAL   sulfamethoxazole-trimethoprim 800-160 MG tablet Commonly known as:  BACTRIM DS,SEPTRA DS      TAKE these medications   butalbital-acetaminophen-caffeine 50-325-40 MG tablet Commonly known as:  FIORICET, ESGIC Take by mouth 2 (two) times daily as needed for headache.   oxyCODONE-acetaminophen 10-325 MG tablet Commonly known as:  PERCOCET Take 1 tablet by mouth every 4 (four) hours as needed for pain.   triamterene-hydrochlorothiazide 50-25 MG capsule Commonly known as:  DYAZIDE Take 1 capsule by mouth every morning.      Follow-up Information    [facility]   Specialty:  Obstetrics and Gynecology Why:  Return on Thursday, 10/21/17, at 8:30 am to the office for repeat labs. You will stay 1-2 hours so results can be given to you that day. Return to MAU with any emergencies. Contact information: [address] [place] [id number] [phone]          Sharen CounterLisa Leftwich-Kirby Certified Nurse-Midwife 10/18/2017  9:48 PM

## 2017-10-18 NOTE — MAU Note (Signed)
Pt. Here due to positive pregnancy test x3 at home.  Pt. Has been spotting for three weeks.

## 2017-10-19 LAB — HIV ANTIBODY (ROUTINE TESTING W REFLEX): HIV Screen 4th Generation wRfx: NONREACTIVE

## 2017-10-19 LAB — HERPES SIMPLEX VIRUS(HSV) DNA BY PCR
HSV 1 DNA: NEGATIVE
HSV 2 DNA: NEGATIVE

## 2017-10-19 LAB — GC/CHLAMYDIA PROBE AMP (~~LOC~~) NOT AT ARMC
CHLAMYDIA, DNA PROBE: NEGATIVE
Neisseria Gonorrhea: NEGATIVE

## 2017-10-21 ENCOUNTER — Ambulatory Visit: Payer: Medicaid Other | Admitting: General Practice

## 2017-10-21 DIAGNOSIS — O00109 Unspecified tubal pregnancy without intrauterine pregnancy: Secondary | ICD-10-CM

## 2017-10-21 LAB — HCG, QUANTITATIVE, PREGNANCY: HCG, BETA CHAIN, QUANT, S: 4462 m[IU]/mL — AB (ref <5)

## 2017-10-21 NOTE — Progress Notes (Signed)
Chart reviewed for nurse visit. Agree with plan of care.   Kooistra, Kathryn Lorraine, CNM 10/21/2017 12:29 PM   

## 2017-10-21 NOTE — Progress Notes (Signed)
Patient here for stat bhcg today for day #4 labs following MTX. Patient denies bleeding and reports only mild cramps. Discussed with patient we are monitoring her bhcg levels today and asked she wait in lobby for results/updated plan of care. Patient verbalized understanding to all & had no questions at this time.   Reviewed results with Luna KitchensKathryn Kooistra who finds slight rise in bcg levels- patient should plan to follow up on Sunday for day 7 labs.  Informed patient of results with plan to follow up levels on Sunday. Discussed importance of going to MAU that day and ectopic precautions given to patient. Patient verbalized understanding to all & had no questions at this time.

## 2017-10-24 ENCOUNTER — Inpatient Hospital Stay (HOSPITAL_COMMUNITY)
Admission: AD | Admit: 2017-10-24 | Discharge: 2017-10-24 | Disposition: A | Discharge status: Home / Self Care | Payer: Medicaid Other | Source: Ambulatory Visit | Attending: Family Medicine | Admitting: Family Medicine

## 2017-10-24 DIAGNOSIS — O9989 Other specified diseases and conditions complicating pregnancy, childbirth and the puerperium: Secondary | ICD-10-CM

## 2017-10-24 DIAGNOSIS — O00101 Right tubal pregnancy without intrauterine pregnancy: Secondary | ICD-10-CM | POA: Diagnosis present

## 2017-10-24 DIAGNOSIS — N9489 Other specified conditions associated with female genital organs and menstrual cycle: Secondary | ICD-10-CM

## 2017-10-24 DIAGNOSIS — N949 Unspecified condition associated with female genital organs and menstrual cycle: Secondary | ICD-10-CM

## 2017-10-24 LAB — HCG, QUANTITATIVE, PREGNANCY: HCG, BETA CHAIN, QUANT, S: 2846 m[IU]/mL — AB (ref <5)

## 2017-10-24 NOTE — MAU Provider Note (Signed)
Subjective:  Sydney Griffin is a 37 y.o. [phone] [id number] at 5768w4d who presents today for FU BHCG. She was seen on 10/18/17. Results from that day show right adnexal mass on US, and HCG 3,330. She denies vaginal bleeding. She denies abdominal or pelvic pain.    Objective:  Physical Exam  Nursing note and vitals reviewed. Constitutional: She is oriented to person, place, and time. She appears well-developed and well-nourished. No distress.  HENT:  Head: Normocephalic.  Cardiovascular: Normal rate.  Respiratory: Effort normal.  GI: Soft. There is no tenderness.  Neurological: She is alert and oriented to person, place, and time. Skin: Skin is warm and dry.  Psychiatric: She has a normal mood and affect.   Results for orders placed or performed during the hospital encounter of 10/24/17 (from the past 24 hour(s))  hCG, quantitative, pregnancy     Status: Abnormal   Collection Time: 10/24/17 12:24 PM  Result Value Ref Range   hCG, Beta Chain, Quant, S 2,846 (H) <5 mIU/mL     Prior Quants: Day 1 MTX: 3330 Day 4 MTX: 4462 Day 7 MTX: 2846 Greater than 15% drop in Hcg level. Patient without pain or bleeding today.   Assessment/Plan: Right adnexal mass HCG declining  FU in 1 weeks for: Hcg level in the WOC. Message sent for scheduling.  Ectopic precautions Pelvic rest No ibuprofen or prenatal vitamins Return to MAU if symptoms worsen  Sydney Griffin, Harolyn RutherfordJennifer I, NP 10/24/2017 1:53 PM

## 2017-10-24 NOTE — MAU Note (Signed)
Patient here for repeat HCG, denies abdominal pain, denies vaginal bleeding.

## 2017-10-24 NOTE — Discharge Instructions (Signed)
Ectopic Pregnancy °An ectopic pregnancy happens when a fertilized egg grows outside the uterus. A pregnancy cannot live outside of the uterus. This problem often happens in the fallopian tube. It is often caused by damage to the fallopian tube. °If this problem is found early, you may be treated with medicine. If your tube tears or bursts open (ruptures), you will bleed inside. This is an emergency. You will need surgery. Get help right away. °What are the signs or symptoms? °You may have normal pregnancy symptoms at first. These include: °· Missing your period. °· Feeling sick to your stomach (nauseous). °· Being tired. °· Having tender breasts. ° °Then, you may start to have symptoms that are not normal. These include: °· Pain with sex (intercourse). °· Bleeding from the vagina. This includes light bleeding (spotting). °· Belly (abdomen) or lower belly cramping or pain. This may be felt on one side. °· A fast heartbeat (pulse). °· Passing out (fainting) after going poop (bowel movement). ° °If your tube tears, you may have symptoms such as: °· Really bad pain in the belly or lower belly. This happens suddenly. °· Dizziness. °· Passing out. °· Shoulder pain. ° °Get help right away if: °You have any of these symptoms. This is an emergency. °This information is not intended to replace advice given to you by your health care provider. Make sure you discuss any questions you have with your health care provider. °Document Released: 11/20/2008 Document Revised: 01/30/2016 Document Reviewed: 04/05/2013 °Elsevier Interactive Patient Education © 2017 Else[employer] Inc. ° °

## 2017-11-01 ENCOUNTER — Other Ambulatory Visit: Payer: Self-pay

## 2017-11-24 ENCOUNTER — Other Ambulatory Visit: Payer: Self-pay

## 2017-11-24 ENCOUNTER — Inpatient Hospital Stay (HOSPITAL_COMMUNITY)
Admission: AD | Admit: 2017-11-24 | Discharge: 2017-11-24 | Disposition: A | Discharge status: Home / Self Care | Payer: Medicaid Other | Source: Ambulatory Visit | Attending: Obstetrics and Gynecology | Admitting: Obstetrics and Gynecology

## 2017-11-24 DIAGNOSIS — Z885 Allergy status to narcotic agent status: Secondary | ICD-10-CM | POA: Diagnosis not present

## 2017-11-24 DIAGNOSIS — O161 Unspecified maternal hypertension, first trimester: Secondary | ICD-10-CM | POA: Diagnosis not present

## 2017-11-24 DIAGNOSIS — Z8249 Family history of ischemic heart disease and other diseases of the circulatory system: Secondary | ICD-10-CM | POA: Insufficient documentation

## 2017-11-24 DIAGNOSIS — Z3A12 12 weeks gestation of pregnancy: Secondary | ICD-10-CM | POA: Diagnosis not present

## 2017-11-24 DIAGNOSIS — Z833 Family history of diabetes mellitus: Secondary | ICD-10-CM | POA: Insufficient documentation

## 2017-11-24 DIAGNOSIS — O99511 Diseases of the respiratory system complicating pregnancy, first trimester: Secondary | ICD-10-CM | POA: Diagnosis not present

## 2017-11-24 DIAGNOSIS — K219 Gastro-esophageal reflux disease without esophagitis: Secondary | ICD-10-CM | POA: Insufficient documentation

## 2017-11-24 DIAGNOSIS — Z886 Allergy status to analgesic agent status: Secondary | ICD-10-CM | POA: Diagnosis not present

## 2017-11-24 DIAGNOSIS — O34219 Maternal care for unspecified type scar from previous cesarean delivery: Secondary | ICD-10-CM | POA: Diagnosis not present

## 2017-11-24 DIAGNOSIS — O99611 Diseases of the digestive system complicating pregnancy, first trimester: Secondary | ICD-10-CM | POA: Diagnosis not present

## 2017-11-24 DIAGNOSIS — O99331 Smoking (tobacco) complicating pregnancy, first trimester: Secondary | ICD-10-CM | POA: Insufficient documentation

## 2017-11-24 DIAGNOSIS — J45909 Unspecified asthma, uncomplicated: Secondary | ICD-10-CM | POA: Insufficient documentation

## 2017-11-24 DIAGNOSIS — O009 Unspecified ectopic pregnancy without intrauterine pregnancy: Secondary | ICD-10-CM

## 2017-11-24 DIAGNOSIS — O26891 Other specified pregnancy related conditions, first trimester: Secondary | ICD-10-CM | POA: Diagnosis present

## 2017-11-24 DIAGNOSIS — F17210 Nicotine dependence, cigarettes, uncomplicated: Secondary | ICD-10-CM | POA: Diagnosis not present

## 2017-11-24 DIAGNOSIS — Z888 Allergy status to other drugs, medicaments and biological substances status: Secondary | ICD-10-CM | POA: Diagnosis not present

## 2017-11-24 LAB — HCG, QUANTITATIVE, PREGNANCY: HCG, BETA CHAIN, QUANT, S: 125 m[IU]/mL — AB (ref <5)

## 2017-11-24 NOTE — MAU Provider Note (Signed)
History     CSN: [id number]  Arrival date and time: 11/24/17 [id number]   First Provider Initiated Contact with Patient 11/24/17 1205      Chief Complaint  Patient presents with  . Abdominal Pain   HPI [name] is a 37 y.o. [phone] at 3429w0d who presents to MAU today with complaint of recent cramping and bleeding. The patient was given MTX for presumed ectopic pregnancy 10/18/16. She was followed with appropriate decline in hCG until day 7 and then did not follow-up as directed for weekly hCG levels. She states that since then she has had 2 episodes of heavier bleeding, but denies bleeding or pain today. She has not had fever. She took a pregnancy test today that was positive, but had never had a negative UPT or hCG to confirm completion of the previous pregnancy.   OB History    Gravida Para Term Preterm AB Living   5 3 3  0 1 3   SAB TAB Ectopic Multiple Live Births       1   3      Past Medical History:  Diagnosis Date  . Acid reflux   . Asthma May 2013   [facility] but pt denies and said she had bronchitis, used an Hydrologistinhlaer while sick and has now lost the inhaler.  Marland Kitchen. Dysrhythmia    rare palpitations  . Ectopic pregnancy    Had MTX  . Headache(784.0)   . Hypertension   . Menstrual bleeding problem     Past Surgical History:  Procedure Laterality Date  . CESAREAN SECTION    . CESAREAN SECTION  03/28/2012   Procedure: CESAREAN SECTION;  Surgeon: Antionette CharLisa Jackson-Moore, MD;  Location: [facility];  Service: Gynecology;  Laterality: N/A;  . CESAREAN SECTION N/A 04/20/2013   Procedure: CESAREAN SECTION;  Surgeon: Antionette CharLisa Jackson-Moore, MD;  Location: [facility];  Service: Obstetrics;  Laterality: N/A;  Repeat Cesarean Section   . DENTAL SURGERY    . DILATION AND CURETTAGE OF UTERUS    . LAPAROSCOPY      Family History  Problem Relation Age of Onset  . Hypertension Mother   . Diabetes Mother   . Diabetes Father   . Hypertension Brother   . Anesthesia problems  Neg Hx   . Other Neg Hx     Social History   Tobacco Use  . Smoking status: Current Every Day Smoker    Packs/day: 1.00    Years: 14.00    Pack years: 14.00    Types: Cigarettes  . Smokeless tobacco: Never Used  Substance Use Topics  . Alcohol use: No    Alcohol/week: 0.0 oz  . Drug use: No    Allergies:  Allergies  Allergen Reactions  . Aspirin Shortness Of Breath  . Dilaudid [Hydromorphone Hcl] Other (See Comments)    Pt says medication causes her to shake uncontrollably.  . Ibuprofen     Gi upset  . Tramadol Nausea Only and Other (See Comments)    Causes headache.  . Vicodin [Hydrocodone-Acetaminophen] Other (See Comments)    Causes headache.    No medications prior to admission.    Review of Systems  Constitutional: Negative for fever.  Gastrointestinal: Negative for abdominal pain, constipation, diarrhea, nausea and vomiting.  Genitourinary: Negative for vaginal bleeding and vaginal discharge.   Physical Exam   Blood pressure 126/68, pulse 85, temperature 98.6 F (37 C), temperature source Oral, resp. rate 17, weight 260 lb 4 oz (  118 kg), last menstrual period 09/01/2017, SpO2 98 %.  Physical Exam  Nursing note and vitals reviewed. Constitutional: She is oriented to person, place, and time. She appears well-developed and well-nourished. No distress.  HENT:  Head: Normocephalic and atraumatic.  Cardiovascular: Normal rate.  Respiratory: Effort normal.  GI: Soft. She exhibits no distension and no mass. There is no tenderness. There is no rebound and no guarding.  Neurological: She is alert and oriented to person, place, and time.  Skin: Skin is warm and dry. No erythema.  Psychiatric: She has a normal mood and affect.     Results for orders placed or performed during the hospital encounter of 11/24/17 (from the past 24 hour(s))  hCG, quantitative, pregnancy     Status: Abnormal   Collection Time: 11/24/17 10:23 AM  Result Value Ref Range   hCG, Beta  Chain, Quant, S 125 (H) <5 mIU/mL     MAU Course  Procedures None  MDM HCG today is very low and patient is asymptomatic. Cannot define if this is remaining from recent previous pregnancy or a new pregnancy as patient has been sexually active since getting MTX. Since she is asymptomatic today we will follow-up with repeat hCG on Friday at [facility] at 9:00 am. Strict ectopic precautions given.   Assessment and Plan  A:  Positive pregnancy test Recent ectopic pregnancy possibly unresolved vs new early pregnancy   P: Discharge home Bleeding/ectopic precautions discussed Patient advised to follow-up with [facility] on Friday at 9:00 am for repeat STAT labs Patient may return to MAU as needed or if her condition were to change or worsen   Vonzella Nipple, PA-C 11/24/2017, 1:16 PM

## 2017-11-24 NOTE — Discharge Instructions (Signed)
Methotrexate Treatment for an Ectopic Pregnancy, Care After °Refer to this sheet in the next few weeks. These instructions provide you with information on caring for yourself after your procedure. Your health care provider may also give you more specific instructions. Your treatment has been planned according to current medical practices, but problems sometimes occur. Call your health care provider if you have any problems or questions after your procedure. °What can I expect after the procedure? °You may have some abdominal cramping, vaginal bleeding, and fatigue in the first few days after taking methotrexate. Some other possible side effects of methotrexate include: °· Nausea. °· Vomiting. °· Diarrhea. °· Mouth sores. °· Swelling or irritation of the lining of your lungs (pneumonitis). °· Liver damage. °· Hair loss. ° °Follow these instructions at home: °After you have received the methotrexate medicine, you need to be careful of your activities and watch your condition for several weeks. It may take 1 week before your hormone levels return to normal. °Activity °· Do not have sexual intercourse until your health care provider says it is safe to do so. °· You may resume your usual diet. °· Limit strenuous activity. °· Do not drink alcohol. °General instructions °· Do not take aspirin, ibuprofen, or naproxen (nonsteroidal anti-inflammatory drugs [NSAIDs]). °· Do not take folic acid, prenatal vitamins, or other vitamins that contain folic acid. °· Avoid traveling too far away from your health care provider. °· Keep all follow-up visits as told by your health care provider. This is important. °Contact a health care provider if: °· You cannot control your nausea and vomiting. °· You cannot control your diarrhea. °· You have sores in your mouth and want treatment. °· You need pain medicine for your abdominal pain. °· You have a rash. °· You are having a reaction to the medicine. °Get help right away if: °· You have  increasing abdominal or pelvic pain. °· You notice increased bleeding. °· You feel light-headed, or you faint. °· You have shortness of breath. °· Your heart rate increases. °· You have a cough. °· You have chills. °· You have a fever. °This information is not intended to replace advice given to you by your health care provider. Make sure you discuss any questions you have with your health care provider. °Document Released: 08/13/2011 Document Revised: 01/30/2016 Document Reviewed: 06/12/2013 °Elsevier Interactive Patient Education © 2017 [employer] ° °

## 2017-11-24 NOTE — MAU Note (Addendum)
Ectopic preg, received Methotrexate, last seen 2/17(day 7 HCG 2846). Bleeding stopped, then bled like a cycle 2 wks later. Had +HPT 3/17 and 3/18.  Sat, thought her period was coming on, had dark brown d/c for 2 days then it stopped.

## 2017-11-25 ENCOUNTER — Telehealth: Payer: Self-pay | Admitting: Family Medicine

## 2017-11-25 NOTE — Telephone Encounter (Signed)
Patient called in to say she could not make her Stat Lab Appt on Friday 11-26-2017 that was given to her while she was in [place], she said she have another appt that she have to attend, I spoke to carrie Hilliman Banker(RN) she said if the patient can't come she will need to return to MAU for Follow up, I spoke to the patient and she understood that's where she need to go.

## 2017-11-27 ENCOUNTER — Encounter (HOSPITAL_COMMUNITY): Payer: Self-pay

## 2017-11-27 ENCOUNTER — Inpatient Hospital Stay (HOSPITAL_COMMUNITY)
Admission: AD | Admit: 2017-11-27 | Discharge: 2017-11-28 | Disposition: A | Discharge status: Home / Self Care | Payer: Medicaid Other | Source: Ambulatory Visit | Attending: Obstetrics and Gynecology | Admitting: Obstetrics and Gynecology

## 2017-11-27 ENCOUNTER — Other Ambulatory Visit: Payer: Self-pay

## 2017-11-27 DIAGNOSIS — Z79899 Other long term (current) drug therapy: Secondary | ICD-10-CM | POA: Diagnosis not present

## 2017-11-27 DIAGNOSIS — Z79891 Long term (current) use of opiate analgesic: Secondary | ICD-10-CM | POA: Diagnosis not present

## 2017-11-27 DIAGNOSIS — Z3A12 12 weeks gestation of pregnancy: Secondary | ICD-10-CM | POA: Diagnosis not present

## 2017-11-27 DIAGNOSIS — O99511 Diseases of the respiratory system complicating pregnancy, first trimester: Secondary | ICD-10-CM | POA: Insufficient documentation

## 2017-11-27 DIAGNOSIS — Z886 Allergy status to analgesic agent status: Secondary | ICD-10-CM | POA: Insufficient documentation

## 2017-11-27 DIAGNOSIS — F17210 Nicotine dependence, cigarettes, uncomplicated: Secondary | ICD-10-CM | POA: Insufficient documentation

## 2017-11-27 DIAGNOSIS — O99331 Smoking (tobacco) complicating pregnancy, first trimester: Secondary | ICD-10-CM | POA: Diagnosis not present

## 2017-11-27 DIAGNOSIS — Z9889 Other specified postprocedural states: Secondary | ICD-10-CM | POA: Insufficient documentation

## 2017-11-27 DIAGNOSIS — O219 Vomiting of pregnancy, unspecified: Secondary | ICD-10-CM | POA: Diagnosis not present

## 2017-11-27 DIAGNOSIS — O99611 Diseases of the digestive system complicating pregnancy, first trimester: Secondary | ICD-10-CM | POA: Insufficient documentation

## 2017-11-27 DIAGNOSIS — Z888 Allergy status to other drugs, medicaments and biological substances status: Secondary | ICD-10-CM | POA: Insufficient documentation

## 2017-11-27 DIAGNOSIS — Z885 Allergy status to narcotic agent status: Secondary | ICD-10-CM | POA: Insufficient documentation

## 2017-11-27 DIAGNOSIS — J45909 Unspecified asthma, uncomplicated: Secondary | ICD-10-CM | POA: Insufficient documentation

## 2017-11-27 DIAGNOSIS — O009 Unspecified ectopic pregnancy without intrauterine pregnancy: Secondary | ICD-10-CM | POA: Diagnosis present

## 2017-11-27 DIAGNOSIS — O161 Unspecified maternal hypertension, first trimester: Secondary | ICD-10-CM | POA: Insufficient documentation

## 2017-11-27 LAB — URINALYSIS, ROUTINE W REFLEX MICROSCOPIC
Bilirubin Urine: NEGATIVE
Glucose, UA: NEGATIVE mg/dL
Hgb urine dipstick: NEGATIVE
Ketones, ur: NEGATIVE mg/dL
Leukocytes, UA: NEGATIVE
Nitrite: NEGATIVE
Protein, ur: NEGATIVE mg/dL
Specific Gravity, Urine: 1.029 (ref 1.005–1.030)
pH: 6 (ref 5.0–8.0)

## 2017-11-27 NOTE — MAU Provider Note (Signed)
Chief Complaint: Abdominal Pain   First Provider Initiated Contact with Patient 11/27/17 2342      SUBJECTIVE HPI: Sydney Griffin is a 37 y.o. [id number] at [redacted]w[redacted]d by LMP who presents to maternity admissions reporting abdominal pain. She reports the abdominal pain started prior to arrival to MAU on 3/20- reported abdominal cramping. She reports continued abdominal cramping, no change in pain thought the cramping was "her about to start period but has not had any bleeding". Rates abdominal pain 2/10. She was given MTX on 10/18/16, followed up appropriately until day 7. She reports IC 3 weeks ago but none since then. Was suppose to be seen in office for repeat HCG yesterday but canceled her appointment due to "having another appointment that she needed to go to".  She reports nausea and vomiting earlier today after taking Percocet for abdominal pain- no complaint of N/V currently, "ready to eat cake I bought".   Past Medical History:  Diagnosis Date  . Acid reflux   . Asthma May 2013   info from [facility] states asthma but pt denies and said she had bronchitis, used an Hydrologist while sick and has now lost the inhaler.  Marland Kitchen Dysrhythmia    rare palpitations  . Ectopic pregnancy    Had MTX  . Headache(784.0)   . Hypertension   . Menstrual bleeding problem    Past Surgical History:  Procedure Laterality Date  . CESAREAN SECTION    . CESAREAN SECTION  03/28/2012   Procedure: CESAREAN SECTION;  Surgeon: Antionette Char, MD;  Location: [facility];  Service: Gynecology;  Laterality: N/A;  . CESAREAN SECTION N/A 04/20/2013   Procedure: CESAREAN SECTION;  Surgeon: Antionette Char, MD;  Location: [facility] ORS;  Service: Obstetrics;  Laterality: N/A;  Repeat Cesarean Section   . DENTAL SURGERY    . DILATION AND CURETTAGE OF UTERUS    . LAPAROSCOPY     Social History   Socioeconomic History  . Marital status: Single    Spouse name: Not on file  . Number of children: Not on file  . Years of education: Not on  file  . Highest education level: Not on file  Occupational History  . Not on file  Social Needs  . Financial resource strain: Not on file  . Food insecurity:    Worry: Not on file    Inability: Not on file  . Transportation needs:    Medical: Not on file    Non-medical: Not on file  Tobacco Use  . Smoking status: Current Every Day Smoker    Packs/day: 1.00    Years: 14.00    Pack years: 14.00    Types: Cigarettes  . Smokeless tobacco: Never Used  Substance and Sexual Activity  . Alcohol use: No    Alcohol/week: 0.0 oz  . Drug use: No  . Sexual activity: Yes    Partners: Male  Lifestyle  . Physical activity:    Days per week: Not on file    Minutes per session: Not on file  . Stress: Not on file  Relationships  . Social connections:    Talks on phone: Not on file    Gets together: Not on file    Attends religious service: Not on file    Active member of club or organization: Not on file    Attends meetings of clubs or organizations: Not on file    Relationship status: Not on file  . Intimate partner violence:    Fear of current  or ex partner: Not on file    Emotionally abused: Not on file    Physically abused: Not on file    Forced sexual activity: Not on file  Other Topics Concern  . Not on file  Social History Narrative  . Not on file   No current facility-administered medications on file prior to encounter.    Current Outpatient Medications on File Prior to Encounter  Medication Sig Dispense Refill  . oxyCODONE-acetaminophen (PERCOCET) 10-325 MG tablet Take 1 tablet by mouth every 4 (four) hours as needed for pain.    Marland Kitchen triamterene-hydrochlorothiazide (DYAZIDE) 50-25 MG per capsule Take 1 capsule by mouth every morning.     Allergies  Allergen Reactions  . Aspirin Shortness Of Breath  . Dilaudid [Hydromorphone Hcl] Other (See Comments)    Pt says medication causes her to shake uncontrollably.  . Ibuprofen     Gi upset  . Tramadol Nausea Only and Other  (See Comments)    Causes headache.  . Vicodin [Hydrocodone-Acetaminophen] Other (See Comments)    Causes headache.    ROS:  Review of Systems  Constitutional: Negative.   Respiratory: Negative.   Cardiovascular: Negative.   Gastrointestinal: Positive for abdominal pain, nausea and vomiting. Negative for constipation and diarrhea.  Genitourinary: Negative.   Musculoskeletal: Negative.   Neurological: Negative.   Psychiatric/Behavioral: Negative.    I have reviewed patient's Past Medical Hx, Surgical Hx, Family Hx, Social Hx, medications and allergies.   Physical Exam   Patient Vitals for the past 24 hrs:  BP Temp Temp src Pulse Resp SpO2 Height Weight  11/28/17 0058 120/67 - - 75 18 99 % - -  11/28/17 0057 120/67 - - 73 18 99 % - -  11/27/17 2257 (!) 141/86 98.1 F (36.7 C) Oral 94 18 97 % 5\' 9"  (1.753 m) 265 lb (120.2 kg)   Constitutional: Well-developed, well-nourished female in no acute distress.  Cardiovascular: normal rate Respiratory: normal effort GI: Abd soft, non-tender. Pos BS x 4 MS: Extremities nontender, no edema, normal ROM Neurologic: Alert and oriented x 4.  GU: Neg CVAT.  Bimanual exam: Cervix 0/long/high, firm, anterior, neg CMT, uterus nontender, nonenlarged, adnexa without tenderness, enlargement, or mass  LAB RESULTS Results for orders placed or performed during the hospital encounter of 11/27/17 (from the past 24 hour(s))  Urinalysis, Routine w reflex microscopic     Status: None   Collection Time: 11/27/17 11:15 PM  Result Value Ref Range   Color, Urine YELLOW YELLOW   APPearance CLEAR CLEAR   Specific Gravity, Urine 1.029 1.005 - 1.030   pH 6.0 5.0 - 8.0   Glucose, UA NEGATIVE NEGATIVE mg/dL   Hgb urine dipstick NEGATIVE NEGATIVE   Bilirubin Urine NEGATIVE NEGATIVE   Ketones, ur NEGATIVE NEGATIVE mg/dL   Protein, ur NEGATIVE NEGATIVE mg/dL   Nitrite NEGATIVE NEGATIVE   Leukocytes, UA NEGATIVE NEGATIVE  hCG, quantitative, pregnancy      Status: Abnormal   Collection Time: 11/27/17 11:28 PM  Result Value Ref Range   hCG, Beta Chain, Quant, S 117 (H) <5 mIU/mL    MAU Management/MDM: Orders Placed This Encounter  Procedures  . Urinalysis, Routine w reflex microscopic  . hCG, quantitative, pregnancy   UA- negative  HCG- levels have dropped but not significantly   Consult Dr Jolayne Panther with assessment and management- recommends follow up in the clinic for weekly HCG until negative. Pt discharged with strict ectopic precautions. Pt stable prior to discharge.   ASSESSMENT 1. Ectopic  pregnancy without intrauterine pregnancy, unspecified location     PLAN Discharge home Follow up as scheduled in the office in 1 week  Return to MAU as needed for increased abdominal pain and/or vaginal bleeding    Allergies as of 11/28/2017      Reactions   Aspirin Shortness Of Breath   Dilaudid [hydromorphone Hcl] Other (See Comments)   Pt says medication causes her to shake uncontrollably.   Ibuprofen    Gi upset   Tramadol Nausea Only, Other (See Comments)   Causes headache.   Vicodin [hydrocodone-acetaminophen] Other (See Comments)   Causes headache.      Medication List    STOP taking these medications   oxyCODONE-acetaminophen 10-325 MG tablet Commonly known as:  PERCOCET     TAKE these medications   triamterene-hydrochlorothiazide 50-25 MG capsule Commonly known as:  DYAZIDE Take 1 capsule by mouth every morning.      Steward DroneVeronica Celsa Nordahl  Certified Nurse-Midwife 11/28/2017  1:29 AM

## 2017-11-27 NOTE — MAU Note (Signed)
Pt. Complains of abdominal pain since 11/26/17.  Denies vaginal bleeding. Pt. Took oxycodone today around 5pm with minimal relief. Pt. Rates pain 7/10. Pt. Was supposed to have labs drawn yesterday, but was unable to keep appt. For previous ectopic pregnancy. Unsure if this is new pregnancy or previous ectopic. Pt. Having some nausea and vomiting today.  Pt. Vomited twice today.

## 2017-11-28 DIAGNOSIS — O009 Unspecified ectopic pregnancy without intrauterine pregnancy: Secondary | ICD-10-CM

## 2017-11-28 LAB — HCG, QUANTITATIVE, PREGNANCY: hCG, Beta Chain, Quant, S: 117 m[IU]/mL — ABNORMAL HIGH (ref <5)

## 2017-11-28 NOTE — Discharge Instructions (Signed)
Ectopic Pregnancy °An ectopic pregnancy happens when a fertilized egg grows outside the uterus. A pregnancy cannot live outside of the uterus. This problem often happens in the fallopian tube. It is often caused by damage to the fallopian tube. °If this problem is found early, you may be treated with medicine. If your tube tears or bursts open (ruptures), you will bleed inside. This is an emergency. You will need surgery. Get help right away. °What are the signs or symptoms? °You may have normal pregnancy symptoms at first. These include: °· Missing your period. °· Feeling sick to your stomach (nauseous). °· Being tired. °· Having tender breasts. ° °Then, you may start to have symptoms that are not normal. These include: °· Pain with sex (intercourse). °· Bleeding from the vagina. This includes light bleeding (spotting). °· Belly (abdomen) or lower belly cramping or pain. This may be felt on one side. °· A fast heartbeat (pulse). °· Passing out (fainting) after going poop (bowel movement). ° °If your tube tears, you may have symptoms such as: °· Really bad pain in the belly or lower belly. This happens suddenly. °· Dizziness. °· Passing out. °· Shoulder pain. ° °Get help right away if: °You have any of these symptoms. This is an emergency. °This information is not intended to replace advice given to you by your health care provider. Make sure you discuss any questions you have with your health care provider. °Document Released: 11/20/2008 Document Revised: 01/30/2016 Document Reviewed: 04/05/2013 °Elsevier Interactive Patient Education © 2017 [employer] ° °Methotrexate Treatment for an Ectopic Pregnancy, Care After °Refer to this sheet in the next few weeks. These instructions provide you with information on caring for yourself after your procedure. Your health care provider may also give you more specific instructions. Your treatment has been planned according to current medical practices, but problems sometimes  occur. Call your health care provider if you have any problems or questions after your procedure. °What can I expect after the procedure? °You may have some abdominal cramping, vaginal bleeding, and fatigue in the first few days after taking methotrexate. Some other possible side effects of methotrexate include: °· Nausea. °· Vomiting. °· Diarrhea. °· Mouth sores. °· Swelling or irritation of the lining of your lungs (pneumonitis). °· Liver damage. °· Hair loss. ° °Follow these instructions at home: °After you have received the methotrexate medicine, you need to be careful of your activities and watch your condition for several weeks. It may take 1 week before your hormone levels return to normal. °Activity °· Do not have sexual intercourse until your health care provider says it is safe to do so. °· You may resume your usual diet. °· Limit strenuous activity. °· Do not drink alcohol. °General instructions °· Do not take aspirin, ibuprofen, or naproxen (nonsteroidal anti-inflammatory drugs [NSAIDs]). °· Do not take folic acid, prenatal vitamins, or other vitamins that contain folic acid. °· Avoid traveling too far away from your health care provider. °· Keep all follow-up visits as told by your health care provider. This is important. °Contact a health care provider if: °· You cannot control your nausea and vomiting. °· You cannot control your diarrhea. °· You have sores in your mouth and want treatment. °· You need pain medicine for your abdominal pain. °· You have a rash. °· You are having a reaction to the medicine. °Get help right away if: °· You have increasing abdominal or pelvic pain. °· You notice increased bleeding. °· You feel light-headed, or   you faint. °· You have shortness of breath. °· Your heart rate increases. °· You have a cough. °· You have chills. °· You have a fever. °This information is not intended to replace advice given to you by your health care provider. Make sure you discuss any questions  you have with your health care provider. °Document Released: 08/13/2011 Document Revised: 01/30/2016 Document Reviewed: 06/12/2013 °Elsevier Interactive Patient Education © 2017 Elsevier Inc. ° °

## 2017-12-10 ENCOUNTER — Telehealth: Payer: Self-pay | Admitting: Family Medicine

## 2017-12-10 NOTE — Telephone Encounter (Signed)
Called patient about coming in to get a STAT BHCG. Patient stated she would got to the emergency room sometime this weekend. Informed her if she did not go, to give us a call on Monday so we could bring her in to get a blood draw. Patient voiced her understanding.

## 2018-02-17 ENCOUNTER — Ambulatory Visit: Payer: Self-pay | Admitting: Obstetrics

## 2018-05-31 ENCOUNTER — Other Ambulatory Visit: Payer: Self-pay

## 2018-05-31 MED ORDER — METRONIDAZOLE 0.75 % VA GEL: 1.0000 | Freq: Every day | VAGINAL | 1 refills | Status: DC

## 2018-05-31 NOTE — Progress Notes (Signed)
Pt c/o "fishy vaginal odor." She requests the gel v/s the pill. [facility]. Pt needs an annual. She agrees to schedule an appt ASAP to prevent denial of future medication requests.

## 2018-06-02 ENCOUNTER — Telehealth: Payer: Self-pay

## 2018-06-02 NOTE — Telephone Encounter (Signed)
Returned call, pt had questions about recurrent bv. Pt states that she soaks in the bath with shampoo, body wash, etc for about 4 hours every day that she can.  Advised patient that this is leading to her recurrent infections. Pt agreed to make changes.

## 2018-08-31 ENCOUNTER — Encounter (HOSPITAL_COMMUNITY): Payer: Self-pay
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
## 2019-03-01 ENCOUNTER — Other Ambulatory Visit: Payer: Self-pay

## 2019-03-01 ENCOUNTER — Inpatient Hospital Stay (HOSPITAL_COMMUNITY): Payer: Medicaid Other

## 2019-03-01 ENCOUNTER — Encounter (HOSPITAL_COMMUNITY): Payer: Self-pay | Admitting: *Deleted

## 2019-03-01 ENCOUNTER — Inpatient Hospital Stay (HOSPITAL_COMMUNITY)
Admission: AD | Admit: 2019-03-01 | Discharge: 2019-03-01 | Disposition: A | Discharge status: Home / Self Care | Payer: Medicaid Other | Attending: Obstetrics and Gynecology | Admitting: Obstetrics and Gynecology

## 2019-03-01 DIAGNOSIS — Z888 Allergy status to other drugs, medicaments and biological substances status: Secondary | ICD-10-CM | POA: Diagnosis not present

## 2019-03-01 DIAGNOSIS — O161 Unspecified maternal hypertension, first trimester: Secondary | ICD-10-CM | POA: Diagnosis not present

## 2019-03-01 DIAGNOSIS — Z833 Family history of diabetes mellitus: Secondary | ICD-10-CM | POA: Insufficient documentation

## 2019-03-01 DIAGNOSIS — O9989 Other specified diseases and conditions complicating pregnancy, childbirth and the puerperium: Secondary | ICD-10-CM | POA: Insufficient documentation

## 2019-03-01 DIAGNOSIS — Z886 Allergy status to analgesic agent status: Secondary | ICD-10-CM | POA: Diagnosis not present

## 2019-03-01 DIAGNOSIS — O34219 Maternal care for unspecified type scar from previous cesarean delivery: Secondary | ICD-10-CM | POA: Diagnosis not present

## 2019-03-01 DIAGNOSIS — Z885 Allergy status to narcotic agent status: Secondary | ICD-10-CM | POA: Diagnosis not present

## 2019-03-01 DIAGNOSIS — Z3A01 Less than 8 weeks gestation of pregnancy: Secondary | ICD-10-CM | POA: Diagnosis not present

## 2019-03-01 DIAGNOSIS — Z8249 Family history of ischemic heart disease and other diseases of the circulatory system: Secondary | ICD-10-CM | POA: Diagnosis not present

## 2019-03-01 DIAGNOSIS — F17210 Nicotine dependence, cigarettes, uncomplicated: Secondary | ICD-10-CM | POA: Insufficient documentation

## 2019-03-01 DIAGNOSIS — O26899 Other specified pregnancy related conditions, unspecified trimester: Secondary | ICD-10-CM

## 2019-03-01 DIAGNOSIS — R1030 Lower abdominal pain, unspecified: Secondary | ICD-10-CM | POA: Diagnosis not present

## 2019-03-01 DIAGNOSIS — O3680X0 Pregnancy with inconclusive fetal viability, not applicable or unspecified: Secondary | ICD-10-CM | POA: Insufficient documentation

## 2019-03-01 LAB — CBC
HCT: 38.5 % (ref 36.0–46.0)
Hemoglobin: 12.7 g/dL (ref 12.0–15.0)
MCH: 31.4 pg (ref 26.0–34.0)
MCHC: 33 g/dL (ref 30.0–36.0)
MCV: 95.3 fL (ref 80.0–100.0)
Platelets: 245 10*3/uL (ref 150–400)
RBC: 4.04 MIL/uL (ref 3.87–5.11)
RDW: 13.1 % (ref 11.5–15.5)
WBC: 10.6 10*3/uL — ABNORMAL HIGH (ref 4.0–10.5)
nRBC: 0 % (ref 0.0–0.2)

## 2019-03-01 LAB — COMPREHENSIVE METABOLIC PANEL
ALT: 14 U/L (ref 0–44)
AST: 16 U/L (ref 15–41)
Albumin: 3.3 g/dL — ABNORMAL LOW (ref 3.5–5.0)
Alkaline Phosphatase: 61 U/L (ref 38–126)
Anion gap: 8 (ref 5–15)
BUN: 5 mg/dL — ABNORMAL LOW (ref 6–20)
CO2: 23 mmol/L (ref 22–32)
Calcium: 8.7 mg/dL — ABNORMAL LOW (ref 8.9–10.3)
Chloride: 107 mmol/L (ref 98–111)
Creatinine, Ser: 1.06 mg/dL — ABNORMAL HIGH (ref 0.44–1.00)
GFR calc Af Amer: >60 mL/min (ref >60)
GFR calc non Af Amer: >60 mL/min (ref >60)
Glucose, Bld: 105 mg/dL — ABNORMAL HIGH (ref 70–99)
Potassium: 3.7 mmol/L (ref 3.5–5.1)
Sodium: 138 mmol/L (ref 135–145)
Total Bilirubin: 0.4 mg/dL (ref 0.3–1.2)
Total Protein: 6 g/dL — ABNORMAL LOW (ref 6.5–8.1)

## 2019-03-01 LAB — WET PREP, GENITAL
Clue Cells Wet Prep HPF POC: NONE SEEN
Sperm: NONE SEEN
Trich, Wet Prep: NONE SEEN
Yeast Wet Prep HPF POC: NONE SEEN

## 2019-03-01 LAB — URINALYSIS, ROUTINE W REFLEX MICROSCOPIC
Bilirubin Urine: NEGATIVE
Glucose, UA: NEGATIVE mg/dL
Hgb urine dipstick: NEGATIVE
Ketones, ur: NEGATIVE mg/dL
Leukocytes,Ua: NEGATIVE
Nitrite: NEGATIVE
Protein, ur: NEGATIVE mg/dL
Specific Gravity, Urine: 1.012 (ref 1.005–1.030)
pH: 6 (ref 5.0–8.0)

## 2019-03-01 LAB — HCG, QUANTITATIVE, PREGNANCY: hCG, Beta Chain, Quant, S: 951 m[IU]/mL — ABNORMAL HIGH (ref <5)

## 2019-03-01 LAB — POCT PREGNANCY, URINE: Preg Test, Ur: POSITIVE — AB

## 2019-03-01 MED ORDER — LABETALOL HCL 200 MG PO TABS: 200.0000 mg | ORAL_TABLET | Freq: Two times a day (BID) | ORAL | 1 refills | Status: DC

## 2019-03-01 NOTE — MAU Provider Note (Signed)
Patient Sydney SearingKalaliah N Griffin 38 y.o. [id number] At 5311w3d here with abdominal pain. She has a history of ectopic pregnancy (2019 and 2011, treated with methotrexate both times.) She denies bleeding, vaginal discharge, dysuria, NV, HA, chills, SOB or fever.   She is a chronic hypertensive; she takes Dyazide and subutex.   She is here because she is concerned that she is having another ectopic and wants to make sure that the pregnancy is in not in her tubes.  History     CSN: [id number]  Arrival date and time: 03/01/19 1100   First Provider Initiated Contact with Patient 03/01/19 1139      Chief Complaint  Patient presents with  . Possible Pregnancy  . Abdominal Pain   Possible Pregnancy This is a new problem. The current episode started today. The problem occurs intermittently (" I haven't really paid attention to when I felt it"). Associated symptoms include abdominal pain. Pertinent negatives include no nausea or vomiting. Nothing aggravates the symptoms. She has tried nothing for the symptoms.  Abdominal Pain Pertinent negatives include no nausea or vomiting.    OB History    Gravida  6   Para  3   Term  3   Preterm  0   AB  2   Living  3     SAB      TAB      Ectopic  2   Multiple      Live Births  3           Past Medical History:  Diagnosis Date  . Acid reflux   . [facility] May 2013   [facility] but pt denies and said she had bronchitis, used an Hydrologistinhlaer while sick and has now lost the inhaler.  Marland Kitchen. Dysrhythmia    rare palpitations  . Ectopic pregnancy    Had MTX  . Headache(784.0)   . Hypertension   . Menstrual bleeding problem     Past Surgical History:  Procedure Laterality Date  . CESAREAN SECTION    . CESAREAN SECTION  03/28/2012   Procedure: CESAREAN SECTION;  Surgeon: Antionette CharLisa Jackson-Moore, MD;  Location: [facility];  Service: Gynecology;  Laterality: N/A;  . CESAREAN SECTION N/A 04/20/2013   Procedure: CESAREAN SECTION;  Surgeon: Antionette CharLisa  Jackson-Moore, MD;  Location: [facility];  Service: Obstetrics;  Laterality: N/A;  Repeat Cesarean Section   . DENTAL SURGERY    . DILATION AND CURETTAGE OF UTERUS    . LAPAROSCOPY      Family History  Problem Relation Age of Onset  . Hypertension Mother   . Diabetes Mother   . Diabetes Father   . Hypertension Brother   . Anesthesia problems Neg Hx   . Other Neg Hx     Social History   Tobacco Use  . Smoking status: Current Every Day Smoker    Packs/day: 1.00    Years: 14.00    Pack years: 14.00    Types: Cigarettes  . Smokeless tobacco: Never Used  Substance Use Topics  . Alcohol use: No    Alcohol/week: 0.0 standard drinks  . Drug use: No    Allergies:  Allergies  Allergen Reactions  . Aspirin Shortness Of Breath  . Dilaudid [Hydromorphone Hcl] Other (See Comments)    Pt says medication causes her to shake uncontrollably.  . Ibuprofen     Gi upset  . Tramadol Nausea Only and Other (See Comments)    Causes headache.  . Vicodin [  Hydrocodone-Acetaminophen] Other (See Comments)    Causes headache.    Medications Prior to Admission  Medication Sig Dispense Refill Last Dose  . buprenorphine-naloxone (SUBOXONE) 8-2 mg SUBL SL tablet Place 1 tablet under the tongue daily.   03/01/2019 at Unknown time  . triamterene-hydrochlorothiazide (DYAZIDE) 50-25 MG per capsule Take 1 capsule by mouth every morning.   03/01/2019 at Unknown time  . metroNIDAZOLE (METROGEL) 0.75 % vaginal gel Place 1 Applicatorful vaginally at bedtime. Apply one applicatorful to vagina at bedtime for 5 days 70 g 1     Review of Systems  Constitutional: Negative.   HENT: Negative.   Respiratory: Negative.   Cardiovascular: Negative.   Gastrointestinal: Positive for abdominal pain. Negative for nausea and vomiting.  Genitourinary: Negative.   Neurological: Negative.    Physical Exam   Blood pressure 133/75, pulse 89, temperature 98.3 F (36.8 C), resp. rate 16, height 5\' 9"  (1.753 m), weight 126.1  kg, last menstrual period 01/29/2019, unknown if currently breastfeeding.  Physical Exam  Constitutional: She is oriented to person, place, and time. She appears well-developed.  HENT:  Head: Normocephalic.  Neck: Normal range of motion.  Respiratory: Effort normal.  GI: Soft.  Genitourinary:    No vaginal discharge.     Genitourinary Comments: NEFG; no CMT, suprapubic or adnexal tenderness.    Musculoskeletal: Normal range of motion.  Neurological: She is alert and oriented to person, place, and time.  Skin: Skin is warm and dry.  Psychiatric: She has a normal mood and affect.    MAU Course  Procedures  MDM -complete ectopic work up done -US shows gestational sac but no yolk sac; sac measures  4 weeks 6 days; subchorionic hemorrhage noted. Technically pregnancy of unknown anatomic location.  -Beta is 951 -Wet prep normal Assessment and Plan   1. Pregnancy of unknown anatomic location   2. Abdominal pain affecting pregnancy    2. Patient stable for discharge with plan to return for [facility] for 2 hours on Friday morning for stat bchg.   3. Reviewed strict ectopic precautions reviewed; patient verbalized understanding.   Mervyn Skeeters Kessie Croston 03/01/2019, 1:48 PM

## 2019-03-01 NOTE — MAU Note (Signed)
.   Sydney Griffin is a 38 y.o. at [redacted]w[redacted]d here in MAU reporting: lower abdominal pain for a couple of days. 3 positive pregnancy test. Pt denies any vaginal bleeding or abnormal discharge LMP: *01/29/19  Onset of complaint: couple of days Pain score:6 Vitals:   03/01/19 1119  BP: (!) 141/79  Pulse: 89  Resp: 16  Temp: 98.3 F (36.8 C)      Lab orders placed from triage: UA/UPT

## 2019-03-02 ENCOUNTER — Telehealth: Payer: Self-pay | Admitting: Family Medicine

## 2019-03-02 LAB — GC/CHLAMYDIA PROBE AMP (~~LOC~~) NOT AT ARMC
Chlamydia: NEGATIVE
Neisseria Gonorrhea: NEGATIVE

## 2019-03-02 NOTE — Telephone Encounter (Signed)
Patient was called and instructed about her visit for 03/03/2019. Patient was screened for COVID-19 symptoms and denied any symptoms. Instructed to use the provided hand sanitizer when entering building. Patient was instructed about wearing a mask for their entire visit, and no visitors.  °

## 2019-03-03 ENCOUNTER — Other Ambulatory Visit: Payer: Self-pay

## 2019-03-03 ENCOUNTER — Ambulatory Visit (INDEPENDENT_AMBULATORY_CARE_PROVIDER_SITE_OTHER): Payer: Medicaid Other

## 2019-03-03 VITALS — BP 176/111 | HR 81

## 2019-03-03 DIAGNOSIS — O3680X0 Pregnancy with inconclusive fetal viability, not applicable or unspecified: Secondary | ICD-10-CM

## 2019-03-03 LAB — BETA HCG QUANT (REF LAB): hCG Quant: 1644 m[IU]/mL

## 2019-03-03 NOTE — Progress Notes (Signed)
Patient seen and assessed by nursing staff.  Agree with documentation and plan.  

## 2019-03-03 NOTE — Progress Notes (Signed)
Pt here today for STAT Beta Lab.  Pt reports mild menstrual like cramping with no bleeding.  Pt advised that it will take approximately two weeks for results in which I will call her.  Pt verbalized understanding.   Received results from [facility] for STAT Beta Lab resulting 1644.  Notified Dr. Kennon Rounds who recommended that pt have a f/u US in a week.  Notified pt providers recommendation.  Pt states that she will be able to attend the [place] on July 8th @ 1500.  I also informed pt to continue to monitor for intense pain and bleeding saturating a pad and if it happens to please go to MAU.  Pt verbalized understanding.    Mel Almond, RN 03/03/19

## 2019-03-15 ENCOUNTER — Ambulatory Visit (HOSPITAL_COMMUNITY)
Admission: RE | Admit: 2019-03-15 | Discharge: 2019-03-15 | Disposition: A | Discharge status: Home / Self Care | Payer: Medicaid Other | Source: Ambulatory Visit | Attending: Family Medicine | Admitting: Family Medicine

## 2019-03-15 ENCOUNTER — Other Ambulatory Visit: Payer: Self-pay

## 2019-03-15 ENCOUNTER — Encounter: Payer: Self-pay | Admitting: Family Medicine

## 2019-03-15 ENCOUNTER — Ambulatory Visit (INDEPENDENT_AMBULATORY_CARE_PROVIDER_SITE_OTHER): Payer: Medicaid Other

## 2019-03-15 DIAGNOSIS — Z712 Person consulting for explanation of examination or test findings: Secondary | ICD-10-CM

## 2019-03-15 DIAGNOSIS — O3680X0 Pregnancy with inconclusive fetal viability, not applicable or unspecified: Secondary | ICD-10-CM | POA: Insufficient documentation

## 2019-03-15 NOTE — Progress Notes (Signed)
Patient seen and assessed by nursing staff during this encounter. I have reviewed the chart and agree with the documentation and plan.  Verita Schneiders, MD 03/15/2019 7:21 PM

## 2019-03-15 NOTE — Progress Notes (Signed)
Pt here today for OB US results.  Pt informed that she has a viable pregnancy with 114 FHR, EDD 11/06/19, and that she is 6w 2d today.  Pt given [place] pics.  Proof of pregnancy letter provided by the front office.  Pt stated that she would be getting prenatal care from [facility].    Mel Almond, RN 03/15/19

## 2019-04-11 ENCOUNTER — Ambulatory Visit (INDEPENDENT_AMBULATORY_CARE_PROVIDER_SITE_OTHER): Payer: Medicaid Other

## 2019-04-11 DIAGNOSIS — O10919 Unspecified pre-existing hypertension complicating pregnancy, unspecified trimester: Secondary | ICD-10-CM | POA: Insufficient documentation

## 2019-04-11 DIAGNOSIS — F1120 Opioid dependence, uncomplicated: Secondary | ICD-10-CM

## 2019-04-11 DIAGNOSIS — O99330 Smoking (tobacco) complicating pregnancy, unspecified trimester: Secondary | ICD-10-CM | POA: Insufficient documentation

## 2019-04-11 DIAGNOSIS — O99331 Smoking (tobacco) complicating pregnancy, first trimester: Secondary | ICD-10-CM

## 2019-04-11 DIAGNOSIS — O09529 Supervision of elderly multigravida, unspecified trimester: Secondary | ICD-10-CM

## 2019-04-11 MED ORDER — VITAFOL GUMMIES 3.33-0.333-34.8 MG PO CHEW: 3.0000 | CHEWABLE_TABLET | Freq: Every day | ORAL | 11 refills | Status: DC

## 2019-04-11 MED ORDER — BLOOD PRESSURE KIT: PACK | 0 refills | Status: DC

## 2019-04-11 NOTE — Progress Notes (Signed)
Agree with A & P. 

## 2019-04-11 NOTE — Progress Notes (Signed)
  Virtual Visit via Telephone Note  I connected with Sydney Griffin on 04/11/19 at  1:15 PM EDT by telephone and verified that I am speaking with the correct person using two identifiers.  Location: Patient: Sydney Griffin Provider: Malcolm Metro, CMA    I discussed the limitations, risks, security and privacy concerns of performing an evaluation and management service by telephone and the availability of in person appointments. I also discussed with the patient that there may be a patient responsible charge related to this service. The patient expressed understanding and agreed to proceed.   History of Present Illness: PRENATAL INTAKE SUMMARY  Ms. Lowe presents today New OB Nurse Interview.  OB History    Gravida  6   Para  3   Term  3   Preterm  0   AB  2   Living  3     SAB      TAB      Ectopic  2   Multiple      Live Births  3          I have reviewed the patient's medical, obstetrical, social, and family histories, medications, and available lab results.  SUBJECTIVE She has no unusual complaints. Pt requests an ultrasound.   Observations/Objective: Initial nurse interview for history  EDD: 11/05/2019 GA: [redacted]w[redacted]d GP: [id number]  FHT: non face to face interview  GENERAL APPEARANCE: alert, oriented to person, place and time, non face to face interview   Assessment and Plan: High risk pregnancy  [facility]- [facility] Prenatal gummies Rx sent to pharmacy Take BP meds as instructed  Smoking cessation education given   U/S at provider's discretion; not routinely done during NOB visits  BP cuff explained and ordered today  Babyscripts explained in detail   Exam and lab work to be completed during provider visit   Follow Up Instructions:   I discussed the assessment and treatment plan with the patient. The patient was provided an opportunity to ask questions and all were answered. The patient agreed with the plan and demonstrated an understanding  of the instructions.   The patient was advised to call back or seek an in-person evaluation if the symptoms worsen or if the condition fails to improve as anticipated.  I provided 25 minutes of non-face-to-face time during this encounter.   Maryruth Eve, CMA

## 2019-04-11 NOTE — Patient Instructions (Addendum)
Smoking During Pregnancy  Smoking during pregnancy is unhealthy for you and your baby. Smoke from cigarettes, pipes, and cigars contains many chemicals that can cause cancer (carcinogens). Cigarettes also contain a stimulant drug (nicotine). When you smoke, harmful substances that you breathe in enter your bloodstream and can be passed on to your baby. This can affect your baby's development. If you are planning to become pregnant or have recently become pregnant, talk with your health care provider about quitting smoking. How does smoking affect me? Smoking increases your risk for many long-term (chronic) diseases. These diseases include cancer, lung diseases, and heart disease. Smoking during pregnancy increases your risk of:  Losing the pregnancy (miscarriage or stillbirth).  Giving birth too early (premature birth).  Pregnancy outside of the uterus (tubal pregnancy).  Having problems with the organ that provides the baby nourishment and oxygen (placenta), including: ? Attachment of the placenta over the opening of the uterus (placenta previa). ? Detachment of the placenta before the baby's birth (placental abruption).  Having your water break before labor begins (premature rupture of membranes). How does smoking affect my baby? Before Birth Smoking during pregnancy:  Decreases blood flow and oxygen to your baby.  Increases your baby's risk of birth defects, such as heart defects.  Increases your baby's heart rate.  Slows your baby's growth in the uterus (intrauterine growth retardation). After Birth Babies born to women who smoked during pregnancy may:  Have symptoms of nicotine withdrawal.  Need to stay in the hospital for special care.  May be too small at birth.  Have a high risk of: ? Serious health problems or lifelong disabilities. ? Sudden infant death syndrome (SIDS). ? Becoming obese. ? Developing behavior or learning problems. What can happen if changes are  not made? When babies are born with a birth defect or illness, they often need to stay in the hospital longer before going home. Hospital stays may also be longer if you had any complications during labor or delivery. Longer hospital stays and more treatments result in higher costs for health care. Many health issues among babies born to mothers who smoke can have a lifelong impact. This may include the long-term need for certain medicines, therapies, or other treatments. What are the benefits of not smoking during pregnancy? You have a much better chance of having a healthy pregnancy and a healthy baby if you do not smoke while you are pregnant. Not smoking also means that you will have a better chance of living a long and healthy life, and your baby will have a better chance of growing into a healthy child and adult. What actions can be taken? Quitting smoking can be difficult. Ask your health care provider for help to stop smoking. You may also consider:  Counseling to help you quit smoking (smoking cessation counseling).  Psychotherapy.  Acupuncture.  Hypnosis.  Telephone [employer] hotlines. If these methods do not help you, talk with your health care provider about other options. Do not take smoking cessation medicines or nicotine supplements unless your health care provider tells you to. Where to find more information Learn more about smoking during pregnancy and quitting smoking from:  March of Dimes: www.marchofdimes.org/pregnancy/smoking-during-pregnancy.aspx  U.S. Department of Health and Human Services: women.smokefree.gov  American Cancer Society: www.cancer.org  American Heart Association: www.heart.org  National Cancer Institute: www.cancer.gov For help to quit smoking:  National smoking cessation telephone hotline: 1-800-QUIT NOW 617-241-5438(970-160-7417) Contact a health care provider if:  You are struggling to quit smoking.  You  are a smoker and you become pregnant or plan to become  pregnant.  You start smoking again after giving birth. Summary  Tobacco smoke contains harmful substances that can affect a baby's health and development.  Smoking increases the risk for serious problems, such as miscarriage, birth defects, or premature birth.  If you need help to quit smoking, ask your health care provider. This information is not intended to replace advice given to you by your health care provider. Make sure you discuss any questions you have with your health care provider. Document Released: 01/05/2005 Document Revised: 08/06/2017 Document Reviewed: 06/12/2016 Elsevier Patient Education  2020 [employer].  First Trimester of Pregnancy  The first trimester of pregnancy is from week 1 until the end of week 13 (months 1 through 3). During this time, your baby will begin to develop inside you. At 6-8 weeks, the eyes and face are formed, and the heartbeat can be seen on ultrasound. At the end of 12 weeks, all the baby's organs are formed. Prenatal care is all the medical care you receive before the birth of your baby. Make sure you get good prenatal care and follow all of your doctor's instructions. Follow these instructions at home: Medicines  Take over-the-counter and prescription medicines only as told by your doctor. Some medicines are safe and some medicines are not safe during pregnancy.  Take a prenatal vitamin that contains at least 600 micrograms (mcg) of folic acid.  If you have trouble pooping (constipation), take medicine that will make your stool soft (stool softener) if your doctor approves. Eating and drinking   Eat regular, healthy meals.  Your doctor will tell you the amount of weight gain that is right for you.  Avoid raw meat and uncooked cheese.  If you feel sick to your stomach (nauseous) or throw up (vomit): ? Eat 4 or 5 small meals a day instead of 3 large meals. ? Try eating a few soda crackers. ? Drink liquids between meals instead of  during meals.  To prevent constipation: ? Eat foods that are high in fiber, like fresh fruits and vegetables, whole grains, and beans. ? Drink enough fluids to keep your pee (urine) clear or pale yellow. Activity  Exercise only as told by your doctor. Stop exercising if you have cramps or pain in your lower belly (abdomen) or low back.  Do not exercise if it is too hot, too humid, or if you are in a place of great height (high altitude).  Try to avoid standing for long periods of time. Move your legs often if you must stand in one place for a long time.  Avoid heavy lifting.  Wear low-heeled shoes. Sit and stand up straight.  You can have sex unless your doctor tells you not to. Relieving pain and discomfort  Wear a good support bra if your breasts are sore.  Take warm water baths (sitz baths) to soothe pain or discomfort caused by hemorrhoids. Use hemorrhoid cream if your doctor says it is okay.  Rest with your legs raised if you have leg cramps or low back pain.  If you have puffy, bulging veins (varicose veins) in your legs: ? Wear support hose or compression stockings as told by your doctor. ? Raise (elevate) your feet for 15 minutes, 3-4 times a day. ? Limit salt in your food. Prenatal care  Schedule your prenatal visits by the twelfth week of pregnancy.  Write down your questions. Take them to your prenatal visits.  Keep all your prenatal visits as told by your doctor. This is important. Safety  Wear your seat belt at all times when driving.  Make a list of emergency phone numbers. The list should include numbers for family, friends, the hospital, and police and fire departments. General instructions  Ask your doctor for a referral to a local prenatal class. Begin classes no later than at the start of month 6 of your pregnancy.  Ask for help if you need counseling or if you need help with nutrition. Your doctor can give you advice or tell you where to go for help.   Do not use hot tubs, steam rooms, or saunas.  Do not douche or use tampons or scented sanitary pads.  Do not cross your legs for long periods of time.  Avoid all herbs and alcohol. Avoid drugs that are not approved by your doctor.  Do not use any tobacco products, including cigarettes, chewing tobacco, and electronic cigarettes. If you need help quitting, ask your doctor. You may get counseling or other support to help you quit.  Avoid cat litter boxes and soil used by cats. These carry germs that can cause birth defects in the baby and can cause a loss of your baby (miscarriage) or stillbirth.  Visit your dentist. At home, brush your teeth with a soft toothbrush. Be gentle when you floss. Contact a doctor if:  You are dizzy.  You have mild cramps or pressure in your lower belly.  You have a nagging pain in your belly area.  You continue to feel sick to your stomach, you throw up, or you have watery poop (diarrhea).  You have a bad smelling fluid coming from your vagina.  You have pain when you pee (urinate).  You have increased puffiness (swelling) in your face, hands, legs, or ankles. Get help right away if:  You have a fever.  You are leaking fluid from your vagina.  You have spotting or bleeding from your vagina.  You have very bad belly cramping or pain.  You gain or lose weight rapidly.  You throw up blood. It may look like coffee grounds.  You are around people who have [place] measles, fifth disease, or chickenpox.  You have a very bad headache.  You have shortness of breath.  You have any kind of trauma, such as from a fall or a car accident. Summary  The first trimester of pregnancy is from week 1 until the end of week 13 (months 1 through 3).  To take care of yourself and your unborn baby, you will need to eat healthy meals, take medicines only if your doctor tells you to do so, and do activities that are safe for you and your baby.  Keep all  follow-up visits as told by your doctor. This is important as your doctor will have to ensure that your baby is healthy and growing well. This information is not intended to replace advice given to you by your health care provider. Make sure you discuss any questions you have with your health care provider. Document Released: 02/10/2008 Document Revised: 12/15/2018 Document Reviewed: 09/01/2016 Elsevier Patient Education  2020 Elsevier Inc.  Hypertension During Pregnancy Hypertension is also called high blood pressure. High blood pressure means that the force of your blood moving in your body is too strong. It can cause problems for you and your baby. Different types of high blood pressure can happen during pregnancy. The types are:  High blood pressure before you  got pregnant. This is called chronic hypertension.  This can continue during your pregnancy. Your doctor will want to keep checking your blood pressure. You may need medicine to keep your blood pressure under control while you are pregnant. You will need follow-up visits after you have your baby.  High blood pressure that goes up during pregnancy when it was normal before. This is called gestational hypertension. It will usually get better after you have your baby, but your doctor will need to watch your blood pressure to make sure that it is getting better.  Very high blood pressure during pregnancy. This is called preeclampsia. Very high blood pressure is an emergency that needs to be checked and treated right away.  You may develop very high blood pressure after giving birth. This is called postpartum preeclampsia. This usually occurs within 48 hours after childbirth but may occur up to 6 weeks after giving birth. This is rare. How does this affect me? If you have high blood pressure during pregnancy, you have a higher chance of developing high blood pressure:  As you get older.  If you get pregnant again. In some cases, high blood  pressure during pregnancy can cause:  Stroke.  Heart attack.  Damage to the kidneys, lungs, or liver.  Preeclampsia.  Jerky movements you cannot control (convulsions or seizures).  Problems with the placenta. How does this affect my baby? Your baby may:  Be born early.  Not weigh as much as he or she should.  Not handle labor well, leading to a c-section birth. What are the risks?  Having high blood pressure during a past pregnancy.  Being overweight.  Being 58 years old or older.  Being pregnant for the first time.  Being pregnant with more than one baby.  Becoming pregnant using fertility methods, such as IVF.  Having other problems, such as diabetes, or kidney disease.  Having family members who have high blood pressure. What can I do to lower my risk?   Keep a healthy weight.  Eat a healthy diet.  Follow what your doctor tells you about treating any medical problems that you had before becoming pregnant. It is very important to go to all of your doctor visits. Your doctor will check your blood pressure and make sure that your pregnancy is progressing as it should. Treatment should start early if a problem is found. How is this treated? Treatment for high blood pressure during pregnancy can differ depending on the type of high blood pressure you have and how serious it is.  You may need to take blood pressure medicine.  If you have been taking medicine for your blood pressure, you may need to change the medicine during pregnancy if it is not safe for your baby.  If your doctor thinks that you could get very high blood pressure, he or she may tell you to take a low-dose aspirin during your pregnancy.  If you have very high blood pressure, you may need to stay in the hospital so you and your baby can be watched closely. You may also need to take medicine to lower your blood pressure. This medicine may be given by mouth or through an IV tube.  In some cases,  if your condition gets worse, you may need to have your baby early. Follow these instructions at home: Eating and drinking   Drink enough fluid to keep your pee (urine) pale yellow.  Avoid caffeine. Lifestyle  Do not use any products that contain  nicotine or tobacco, such as cigarettes, e-cigarettes, and chewing tobacco. If you need help quitting, ask your doctor.  Do not use alcohol or drugs.  Avoid stress.  Rest and get plenty of sleep.  Regular exercise can help. Ask your doctor what kinds of exercise are best for you. General instructions  Take over-the-counter and prescription medicines only as told by your doctor.  Keep all prenatal and follow-up visits as told by your doctor. This is important. Contact a doctor if:  You have symptoms that your doctor told you to watch for, such as: ? Headaches. ? Nausea. ? Vomiting. ? Belly (abdominal) pain. ? Dizziness. ? Light-headedness. Get help right away if:  You have: ? Very bad belly pain that does not get better with treatment. ? A very bad headache that does not get better. ? Vomiting that does not get better. ? Sudden, fast weight gain. ? Sudden swelling in your hands, ankles, or face. ? Bleeding from your vagina. ? Blood in your pee. ? Blurry vision. ? Double vision. ? Shortness of breath. ? Chest pain. ? Weakness on one side of your body. ? Trouble talking.  Your baby is not moving as much as usual. Summary  High blood pressure is also called hypertension.  High blood pressure means that the force of your blood moving in your body is too strong.  High blood pressure can cause problems for you and your baby.  Keep all follow-up visits as told by your doctor. This is important. This information is not intended to replace advice given to you by your health care provider. Make sure you discuss any questions you have with your health care provider. Document Released: 09/26/2010 Document Revised: 12/15/2018  Document Reviewed: 09/20/2018 Elsevier Patient Education  2020 [employer]

## 2019-04-18 ENCOUNTER — Ambulatory Visit (INDEPENDENT_AMBULATORY_CARE_PROVIDER_SITE_OTHER): Payer: Medicaid Other | Admitting: Obstetrics and Gynecology

## 2019-04-18 ENCOUNTER — Other Ambulatory Visit: Payer: Self-pay

## 2019-04-18 ENCOUNTER — Other Ambulatory Visit (HOSPITAL_COMMUNITY)
Admission: RE | Admit: 2019-04-18 | Discharge: 2019-04-18 | Disposition: A | Discharge status: Home / Self Care | Payer: Medicaid Other | Source: Ambulatory Visit | Attending: Obstetrics and Gynecology | Admitting: Obstetrics and Gynecology

## 2019-04-18 ENCOUNTER — Encounter: Payer: Self-pay | Admitting: Obstetrics and Gynecology

## 2019-04-18 DIAGNOSIS — O09521 Supervision of elderly multigravida, first trimester: Secondary | ICD-10-CM

## 2019-04-18 DIAGNOSIS — O09529 Supervision of elderly multigravida, unspecified trimester: Secondary | ICD-10-CM

## 2019-04-18 DIAGNOSIS — O34219 Maternal care for unspecified type scar from previous cesarean delivery: Secondary | ICD-10-CM | POA: Diagnosis not present

## 2019-04-18 DIAGNOSIS — Z3A11 11 weeks gestation of pregnancy: Secondary | ICD-10-CM

## 2019-04-18 DIAGNOSIS — O10919 Unspecified pre-existing hypertension complicating pregnancy, unspecified trimester: Secondary | ICD-10-CM

## 2019-04-18 DIAGNOSIS — O10911 Unspecified pre-existing hypertension complicating pregnancy, first trimester: Secondary | ICD-10-CM

## 2019-04-18 NOTE — Progress Notes (Signed)
Subjective:    Sydney Griffin is a [id number] being seen today for her first obstetrical visit.  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 Patient does intend to breast feed. Pregnancy history fully reviewed.  Patient reports no complaints.  There were no vitals filed for this visit.  HISTORY: OB History  Gravida Para Term Preterm AB Living  6 3 3  0 2 3  SAB TAB Ectopic Multiple Live Births      2   3    # Outcome Date GA Lbr Len/2nd Weight Sex Delivery Anes PTL Lv  6 Current           5 Term 04/20/13 251w0d  7 lb 9 oz (3.43 kg) F CS-LTranv Spinal  LIV  4 Term 03/28/12 3933w0d  5 lb 10 oz (2.55 kg) F CS-LTranv Spinal  LIV  3 Term 03/08/03 2672w0d  7 lb 2 oz (3.232 kg) F CS-LTranv EPI  LIV  2 Ectopic           1 Ectopic            Past Medical History:  Diagnosis Date  . Acid reflux   . Asthma May 2013   [facility] but pt denies and said she had bronchitis, used an Hydrologistinhlaer while sick and has now lost the inhaler.  Marland Kitchen. Dysrhythmia    rare palpitations  . Ectopic pregnancy    Had MTX  . Headache(784.0)   . Hypertension   . Menstrual bleeding problem    Past Surgical History:  Procedure Laterality Date  . CESAREAN SECTION    . CESAREAN SECTION  03/28/2012   Procedure: CESAREAN SECTION;  Surgeon: Antionette CharLisa Jackson-Moore, MD;  Location: [facility];  Service: Gynecology;  Laterality: N/A;  . CESAREAN SECTION N/A 04/20/2013   Procedure: CESAREAN SECTION;  Surgeon: Antionette CharLisa Jackson-Moore, MD;  Location: [facility];  Service: Obstetrics;  Laterality: N/A;  Repeat Cesarean Section   . DENTAL SURGERY    . DILATION AND CURETTAGE OF UTERUS    . LAPAROSCOPY     Family History  Problem Relation Age of Onset  . Hypertension Mother   . Diabetes Mother   . Diabetes Father   . Hyperlipidemia Father   . Hypertension Brother   . Anesthesia problems Neg  Hx   . Other Neg Hx      Exam    Uterus:   12-weeks  Pelvic Exam:    Perineum: Normal Perineum   Vulva: normal   Vagina:  normal mucosa, normal discharge   pH:    Cervix: nulliparous appearance   Adnexa: normal adnexa and no mass, fullness, tenderness   Bony Pelvis: gynecoid  System: Breast:  normal appearance, no masses or tenderness   Skin: normal coloration and turgor, no rashes    Neurologic: oriented, no focal deficits   Extremities: normal strength, tone, and muscle mass   HEENT extra ocular movement intact   Mouth/Teeth mucous membranes moist, pharynx normal without lesions and dental hygiene good   Neck supple and no masses   Cardiovascular: regular rate and rhythm   Respiratory:  appears well, vitals normal, no respiratory distress, acyanotic, normal RR, chest clear, no wheezing, crepitations, rhonchi, normal symmetric air entry   Abdomen: soft, non-tender; bowel sounds normal; no masses,  no organomegaly   Urinary:      Pt informed that the ultrasound  is considered a limited OB ultrasound and is not intended to be a complete ultrasound exam.  Patient also informed that the ultrasound is not being completed with the intent of assessing for fetal or placental anomalies or any pelvic abnormalities.  Explained that the purpose of today's ultrasound is to assess for  viability.  Patient acknowledges the purpose of the exam and the limitations of the study.     Assessment:    Pregnancy: [id number] Patient Active Problem List   Diagnosis Date Noted  . Encounter for supervision of high risk multigravida of advanced maternal age, antepartum 04/11/2019  . Chronic hypertension during pregnancy 04/11/2019  . Tobacco use complicating pregnancy 94/17/4081  . Suboxone maintenance treatment complicating pregnancy, antepartum ([place]) 04/11/2019  . Menorrhagia with regular cycle 09/22/2016  . Carpal tunnel syndrome 02/22/2013  . Tobacco abuse 02/15/2013  . Morbid obesity ([place])  03/27/2012        Plan:     Initial labs drawn. Prenatal vitamins. Problem list reviewed and updated. Genetic Screening discussed : Panorama ordered.  Ultrasound discussed; fetal survey: ordered. Continue labetalol A1c and baseline labs ordered Patient has an allergy to ASA and declined prescriptions Patient understands that she will be scheduled for another c-section  Follow up in 4 weeks. 50% of 30 min visit spent on counseling and coordination of care.     Timesha Cervantez 04/18/2019

## 2019-04-18 NOTE — Progress Notes (Signed)
Pt is here for new OB appt. LMP 01/29/19 approx. [place] on 03/15/19 confirms EDD 11/06/19.

## 2019-04-19 LAB — OBSTETRIC PANEL, INCLUDING HIV
Antibody Screen: NEGATIVE
Basophils Absolute: 0 10*3/uL (ref 0.0–0.2)
Basos: 0 %
EOS (ABSOLUTE): 0.2 10*3/uL (ref 0.0–0.4)
Eos: 2 %
HIV Screen 4th Generation wRfx: NONREACTIVE
Hematocrit: 36.2 % (ref 34.0–46.6)
Hemoglobin: 12 g/dL (ref 11.1–15.9)
Hepatitis B Surface Ag: NEGATIVE
Immature Grans (Abs): 0 10*3/uL (ref 0.0–0.1)
Immature Granulocytes: 0 %
Lymphocytes Absolute: 2.4 10*3/uL (ref 0.7–3.1)
Lymphs: 22 %
MCH: 31.7 pg (ref 26.6–33.0)
MCHC: 33.1 g/dL (ref 31.5–35.7)
MCV: 96 fL (ref 79–97)
Monocytes Absolute: 0.8 10*3/uL (ref 0.1–0.9)
Monocytes: 8 %
Neutrophils Absolute: 7.3 10*3/uL — ABNORMAL HIGH (ref 1.4–7.0)
Neutrophils: 68 %
Platelets: 250 10*3/uL (ref 150–450)
RBC: 3.78 x10E6/uL (ref 3.77–5.28)
RDW: 12.3 % (ref 11.7–15.4)
RPR Ser Ql: NONREACTIVE
Rh Factor: POSITIVE
Rubella Antibodies, IGG: 1.85 index (ref >0.99)
WBC: 10.9 10*3/uL — ABNORMAL HIGH (ref 3.4–10.8)

## 2019-04-19 LAB — HEMOGLOBIN A1C
Est. average glucose Bld gHb Est-mCnc: 97 mg/dL
Hgb A1c MFr Bld: 5 % (ref 4.8–5.6)

## 2019-04-19 LAB — PROTEIN / CREATININE RATIO, URINE
Creatinine, Urine: 158.8 mg/dL
Protein, Ur: 14.6 mg/dL
Protein/Creat Ratio: 92 mg/g{creat} (ref 0–200)

## 2019-04-20 LAB — GC/CHLAMYDIA PROBE AMP (~~LOC~~) NOT AT ARMC
Chlamydia: NEGATIVE
Neisseria Gonorrhea: NEGATIVE

## 2019-04-20 LAB — CULTURE, OB URINE

## 2019-04-20 LAB — URINE CULTURE, OB REFLEX

## 2019-04-20 NOTE — Addendum Note (Signed)
Addended by: Mora Bellman on: 04/20/2019 09:16 AM   Modules accepted: Orders

## 2019-04-21 ENCOUNTER — Other Ambulatory Visit: Payer: Self-pay

## 2019-04-21 ENCOUNTER — Other Ambulatory Visit: Payer: Medicaid Other

## 2019-04-21 LAB — CYTOLOGY - PAP
Diagnosis: NEGATIVE
HPV: NOT DETECTED

## 2019-04-22 LAB — COMPREHENSIVE METABOLIC PANEL
ALT: 20 IU/L (ref 0–32)
AST: 16 IU/L (ref 0–40)
Albumin/Globulin Ratio: 1.6 (ref 1.2–2.2)
Albumin: 3.7 g/dL — ABNORMAL LOW (ref 3.8–4.8)
Alkaline Phosphatase: 70 IU/L (ref 39–117)
BUN/Creatinine Ratio: 11 (ref 9–23)
BUN: 9 mg/dL (ref 6–20)
Bilirubin Total: 0.3 mg/dL (ref 0.0–1.2)
CO2: 21 mmol/L (ref 20–29)
Calcium: 8.9 mg/dL (ref 8.7–10.2)
Chloride: 104 mmol/L (ref 96–106)
Creatinine, Ser: 0.83 mg/dL (ref 0.57–1.00)
GFR calc Af Amer: 106 mL/min/{1.73_m2} (ref >59)
GFR calc non Af Amer: 92 mL/min/{1.73_m2} (ref >59)
Globulin, Total: 2.3 g/dL (ref 1.5–4.5)
Glucose: 104 mg/dL — ABNORMAL HIGH (ref 65–99)
Potassium: 3.9 mmol/L (ref 3.5–5.2)
Sodium: 137 mmol/L (ref 134–144)
Total Protein: 6 g/dL (ref 6.0–8.5)

## 2019-05-02 ENCOUNTER — Telehealth: Payer: Self-pay | Admitting: *Deleted

## 2019-05-02 ENCOUNTER — Other Ambulatory Visit: Payer: Self-pay | Admitting: *Deleted

## 2019-05-02 MED ORDER — DOXYLAMINE-PYRIDOXINE 10-10 MG PO TBEC: DELAYED_RELEASE_TABLET | ORAL | 2 refills | Status: DC

## 2019-05-02 NOTE — Progress Notes (Signed)
Diclegis Rx sent in for nausea.

## 2019-05-02 NOTE — Telephone Encounter (Signed)
Pt called to office with questions about BP meds. Pt states she doesn't think her BP medicine is working.  Pt states she is currently taking Labetalol as prescribed.  Pt states she has also taken her "fluid" pill(Dyazide) couple of times due to HA and swelling.  Pt denies any visual changes. Pt would like to know if there should be any changes to Rx or what else she should do. Pt advised to continue Labetalol as given, pt thinks it may be causing HA's. Pt currently does not have a BP cuff at home and one has been ordered for her through office.  Pt made aware message to be sent to provider today for further recommendations and to expect return call.  Pt also request Rx for Nausea, Diclegis to be sent today- pt made aware.     Please advise on BP meds / need for BP check in office.

## 2019-05-03 ENCOUNTER — Encounter (HOSPITAL_COMMUNITY): Payer: Self-pay

## 2019-05-03 ENCOUNTER — Inpatient Hospital Stay (HOSPITAL_COMMUNITY): Payer: Medicaid Other

## 2019-05-03 ENCOUNTER — Other Ambulatory Visit: Payer: Self-pay

## 2019-05-03 ENCOUNTER — Inpatient Hospital Stay (HOSPITAL_COMMUNITY)
Admission: AD | Admit: 2019-05-03 | Discharge: 2019-05-03 | Disposition: A | Discharge status: Home / Self Care | Payer: Medicaid Other | Attending: Obstetrics and Gynecology | Admitting: Obstetrics and Gynecology

## 2019-05-03 DIAGNOSIS — Z833 Family history of diabetes mellitus: Secondary | ICD-10-CM | POA: Insufficient documentation

## 2019-05-03 DIAGNOSIS — J45909 Unspecified asthma, uncomplicated: Secondary | ICD-10-CM | POA: Diagnosis not present

## 2019-05-03 DIAGNOSIS — Z886 Allergy status to analgesic agent status: Secondary | ICD-10-CM | POA: Diagnosis not present

## 2019-05-03 DIAGNOSIS — O2342 Unspecified infection of urinary tract in pregnancy, second trimester: Secondary | ICD-10-CM

## 2019-05-03 DIAGNOSIS — O99511 Diseases of the respiratory system complicating pregnancy, first trimester: Secondary | ICD-10-CM | POA: Diagnosis not present

## 2019-05-03 DIAGNOSIS — O26892 Other specified pregnancy related conditions, second trimester: Secondary | ICD-10-CM

## 2019-05-03 DIAGNOSIS — F17210 Nicotine dependence, cigarettes, uncomplicated: Secondary | ICD-10-CM | POA: Diagnosis not present

## 2019-05-03 DIAGNOSIS — Z885 Allergy status to narcotic agent status: Secondary | ICD-10-CM | POA: Diagnosis not present

## 2019-05-03 DIAGNOSIS — O26899 Other specified pregnancy related conditions, unspecified trimester: Secondary | ICD-10-CM

## 2019-05-03 DIAGNOSIS — O99331 Smoking (tobacco) complicating pregnancy, first trimester: Secondary | ICD-10-CM | POA: Insufficient documentation

## 2019-05-03 DIAGNOSIS — R109 Unspecified abdominal pain: Secondary | ICD-10-CM

## 2019-05-03 DIAGNOSIS — Z8249 Family history of ischemic heart disease and other diseases of the circulatory system: Secondary | ICD-10-CM | POA: Diagnosis not present

## 2019-05-03 DIAGNOSIS — Z888 Allergy status to other drugs, medicaments and biological substances status: Secondary | ICD-10-CM | POA: Insufficient documentation

## 2019-05-03 DIAGNOSIS — O9989 Other specified diseases and conditions complicating pregnancy, childbirth and the puerperium: Secondary | ICD-10-CM | POA: Diagnosis not present

## 2019-05-03 DIAGNOSIS — O34219 Maternal care for unspecified type scar from previous cesarean delivery: Secondary | ICD-10-CM | POA: Diagnosis not present

## 2019-05-03 DIAGNOSIS — Z3A13 13 weeks gestation of pregnancy: Secondary | ICD-10-CM | POA: Insufficient documentation

## 2019-05-03 DIAGNOSIS — O2340 Unspecified infection of urinary tract in pregnancy, unspecified trimester: Secondary | ICD-10-CM

## 2019-05-03 DIAGNOSIS — O161 Unspecified maternal hypertension, first trimester: Secondary | ICD-10-CM | POA: Diagnosis not present

## 2019-05-03 LAB — COMPREHENSIVE METABOLIC PANEL
ALT: 13 U/L (ref 0–44)
AST: 15 U/L (ref 15–41)
Albumin: 2.9 g/dL — ABNORMAL LOW (ref 3.5–5.0)
Alkaline Phosphatase: 66 U/L (ref 38–126)
Anion gap: 10 (ref 5–15)
BUN: 9 mg/dL (ref 6–20)
CO2: 24 mmol/L (ref 22–32)
Calcium: 9 mg/dL (ref 8.9–10.3)
Chloride: 105 mmol/L (ref 98–111)
Creatinine, Ser: 0.77 mg/dL (ref 0.44–1.00)
GFR calc Af Amer: >60 mL/min (ref >60)
GFR calc non Af Amer: >60 mL/min (ref >60)
Glucose, Bld: 99 mg/dL (ref 70–99)
Potassium: 3.9 mmol/L (ref 3.5–5.1)
Sodium: 139 mmol/L (ref 135–145)
Total Bilirubin: 0.6 mg/dL (ref 0.3–1.2)
Total Protein: 5.9 g/dL — ABNORMAL LOW (ref 6.5–8.1)

## 2019-05-03 LAB — URINALYSIS, ROUTINE W REFLEX MICROSCOPIC
Glucose, UA: NEGATIVE mg/dL
Hgb urine dipstick: NEGATIVE
Ketones, ur: 5 mg/dL — AB
Leukocytes,Ua: NEGATIVE
Nitrite: NEGATIVE
Protein, ur: 30 mg/dL — AB
Specific Gravity, Urine: 1.043 — ABNORMAL HIGH (ref 1.005–1.030)
pH: 5 (ref 5.0–8.0)

## 2019-05-03 LAB — PROTEIN / CREATININE RATIO, URINE
Creatinine, Urine: 568.27 mg/dL
Protein Creatinine Ratio: 0.04 mg/mg{Cre} (ref 0.00–0.15)
Total Protein, Urine: 25 mg/dL

## 2019-05-03 LAB — CBC
HCT: 35.2 % — ABNORMAL LOW (ref 36.0–46.0)
Hemoglobin: 12 g/dL (ref 12.0–15.0)
MCH: 32.4 pg (ref 26.0–34.0)
MCHC: 34.1 g/dL (ref 30.0–36.0)
MCV: 95.1 fL (ref 80.0–100.0)
Platelets: 246 10*3/uL (ref 150–400)
RBC: 3.7 MIL/uL — ABNORMAL LOW (ref 3.87–5.11)
RDW: 12.8 % (ref 11.5–15.5)
WBC: 12.8 10*3/uL — ABNORMAL HIGH (ref 4.0–10.5)
nRBC: 0 % (ref 0.0–0.2)

## 2019-05-03 MED ORDER — CEPHALEXIN 500 MG PO CAPS: 500.0000 mg | ORAL_CAPSULE | Freq: Four times a day (QID) | ORAL | 0 refills | Status: AC

## 2019-05-03 NOTE — Discharge Instructions (Signed)
Pregnancy and Urinary Tract Infection  A urinary tract infection (UTI) is an infection of any part of the urinary tract. This includes the kidneys, the tubes that connect your kidneys to your bladder (ureters), the bladder, and the tube that carries urine out of your body (urethra). These organs make, store, and get rid of urine in the body. Your health care provider may use other names to describe the infection. An upper UTI affects the ureters and kidneys (pyelonephritis). A lower UTI affects the bladder (cystitis) and urethra (urethritis).  Abdominal Pain During Pregnancy  Belly (abdominal) pain is common during pregnancy. There are many possible causes. Most of the time, it is not a serious problem. Other times, it can be a sign that something is wrong with the pregnancy. Always tell your doctor if you have belly pain. Follow these instructions at home:  Do not have sex or put anything in your vagina until your pain goes away completely.  Get plenty of rest until your pain gets better.  Drink enough fluid to keep your pee (urine) pale yellow.  Take over-the-counter and prescription medicines only as told by your doctor.  Keep all follow-up visits as told by your doctor. This is important. Contact a doctor if:  Your pain continues or gets worse after resting.  You have lower belly pain that: ? Comes and goes at regular times. ? Spreads to your back. ? Feels like menstrual cramps.  You have pain or burning when you pee (urinate). Get help right away if:  You have a fever or chills.  You have vaginal bleeding.  You are leaking fluid from your vagina.  You are passing tissue from your vagina.  You throw up (vomit) for more than 24 hours.  You have watery poop (diarrhea) for more than 24 hours.  Your baby is moving less than usual.  You feel very weak or faint.  You have shortness of breath.  You have very bad pain in your upper belly. Summary  Belly (abdominal) pain  is common during pregnancy. There are many possible causes.  If you have belly pain during pregnancy, tell your doctor right away.  Keep all follow-up visits as told by your doctor. This is important. This information is not intended to replace advice given to you by your health care provider. Make sure you discuss any questions you have with your health care provider. Document Released: 08/12/2009 Document Revised: 12/12/2018 Document Reviewed: 11/26/2016 Elsevier Patient Education  2020 [employer]  Most urinary tract infections are caused by bacteria in your genital area, around the entrance to your urinary tract (urethra). These bacteria grow and cause irritation and inflammation of your urinary tract. You are more likely to develop a UTI during pregnancy because the physical and hormonal changes your body goes through can make it easier for bacteria to get into your urinary tract. Your growing baby also puts pressure on your bladder and can affect urine flow. It is important to recognize and treat UTIs in pregnancy because of the risk of serious complications for both you and your baby. How does this affect me? Symptoms of a UTI include:  Needing to urinate right away (urgently).  Frequent urination or passing small amounts of urine frequently.  Pain or burning with urination.  Blood in the urine.  Urine that smells bad or unusual.  Trouble urinating.  Cloudy urine.  Pain in the abdomen or lower back.  Vaginal discharge. You may also have:  Vomiting or a  decreased appetite.  Confusion.  Irritability or tiredness.  A fever.  Diarrhea. How does this affect my baby? An untreated UTI during pregnancy could lead to a kidney infection or a systemic infection, which can cause health problems that could affect your baby. Possible complications of an untreated UTI include:  Giving birth to your baby before 37 weeks of pregnancy (premature).  Having a baby with a low birth  weight.  Developing high blood pressure during pregnancy (preeclampsia).  Having a low hemoglobin level (anemia). What can I do to lower my risk? To prevent a UTI:  Go to the bathroom as soon as you feel the need. Do not hold urine for long periods of time.  Always wipe from front to back, especially after a bowel movement. Use each tissue one time when you wipe.  Empty your bladder after sex.  Keep your genital area dry.  Drink 6-10 glasses of water each day.  Do not douche or use deodorant sprays. How is this treated? Treatment for this condition may include:  Antibiotic medicines that are safe to take during pregnancy.  Other medicines to treat less common causes of UTI. Follow these instructions at home:  If you were prescribed an antibiotic medicine, take it as told by your health care provider. Do not stop using the antibiotic even if you start to feel better.  Keep all follow-up visits as told by your health care provider. This is important. Contact a health care provider if:  Your symptoms do not improve or they get worse.  You have abnormal vaginal discharge. Get help right away if you:  Have a fever.  Have nausea and vomiting.  Have back or side pain.  Feel contractions in your uterus.  Have lower belly pain.  Have a gush of fluid from your vagina.  Have blood in your urine. Summary  A urinary tract infection (UTI) is an infection of any part of the urinary tract, which includes the kidneys, ureters, bladder, and urethra.  Most urinary tract infections are caused by bacteria in your genital area, around the entrance to your urinary tract (urethra).  You are more likely to develop a UTI during pregnancy.  If you were prescribed an antibiotic medicine, take it as told by your health care provider. Do not stop using the antibiotic even if you start to feel better. This information is not intended to replace advice given to you by your health care  provider. Make sure you discuss any questions you have with your health care provider. Document Released: 12/19/2010 Document Revised: 12/16/2018 Document Reviewed: 07/28/2018 Elsevier Patient Education  2020 [employer].

## 2019-05-03 NOTE — MAU Note (Signed)
Called lab about patient's CBC. Tech told me that the machine was down for maintenance.  She said she'd run it ASAP.

## 2019-05-03 NOTE — MAU Note (Addendum)
Pt presents to [facility] with sudden onset stomach pain which started yesterday at noon. Denies bleeding or LOF.   The pain is coming and going. She feels decreased fetal movement.  Has nausea but it is not new this pregnancy.

## 2019-05-03 NOTE — MAU Provider Note (Signed)
History     CSN: [id number]  Arrival date and time: 05/03/19 1020   First Provider Initiated Contact with Patient 05/03/19 1134      Chief Complaint  Patient presents with  . Abdominal Pain   Sydney Griffin is a 38 yo [id number] at 13.3 EGA, who is presenting for evaluation of abdominal pain. She says the onset was yesterday afternoon, and describes them as intermittent and crampy in nature. Tylenol has not helped. They are located in her lower quadrants, especially over her bladder. There is no radiation of pain. She does endorse relief of her abdominal pain with urination, but denies dysuria, hematuria, vaginal discharge, frequency, urgency, or flank pain. She states that she does not drink a lot of water, she prefers lemonade or iced tea, but does not consume a lot of liquid because she of the "water pill" she is on for her hypertension.  She is a chronic hypertensive; she was on Dyazide, but was switched to labetalol. She has taken two doses of HCTZ-triamterene in the last week to "get water off," the last was on Friday. She says she gets headaches when her blood pressure gets too high, and she doesn't think the labetalol works as well. She denies leg swelling.   Her first prenatal appointment was last week, and all STI testing was negative. She has had no new sexual partners since then. Pap was normal at that time.   OB History    Gravida  6   Para  3   Term  3   Preterm  0   AB  2   Living  3     SAB      TAB      Ectopic  2   Multiple      Live Births  3           Past Medical History:  Diagnosis Date  . Acid reflux   . Asthma May 2013   info from [facility] states asthma but pt denies and said she had bronchitis, used an Occupational psychologist while sick and has now lost the inhaler.  Marland Kitchen Dysrhythmia    rare palpitations  . Ectopic pregnancy    Had MTX  . Headache(784.0)   . Hypertension   . Menstrual bleeding problem     Past Surgical History:  Procedure Laterality Date   . CESAREAN SECTION    . CESAREAN SECTION  03/28/2012   Procedure: CESAREAN SECTION;  Surgeon: Lahoma Crocker, MD;  Location: [facility];  Service: Gynecology;  Laterality: N/A;  . CESAREAN SECTION N/A 04/20/2013   Procedure: CESAREAN SECTION;  Surgeon: Lahoma Crocker, MD;  Location: [facility];  Service: Obstetrics;  Laterality: N/A;  Repeat Cesarean Section   . DENTAL SURGERY    . DILATION AND CURETTAGE OF UTERUS    . LAPAROSCOPY      Family History  Problem Relation Age of Onset  . Hypertension Mother   . Diabetes Mother   . Diabetes Father   . Hyperlipidemia Father   . Hypertension Brother   . Anesthesia problems Neg Hx   . Other Neg Hx     Social History   Tobacco Use  . Smoking status: Current Every Day Smoker    Packs/day: 1.00    Years: 14.00    Pack years: 14.00    Types: Cigarettes  . Smokeless tobacco: Never Used  Substance Use Topics  . Alcohol use: No    Alcohol/week: 0.0 standard drinks  . Drug use:  No    Allergies:  Allergies  Allergen Reactions  . Aspirin Shortness Of Breath  . Dilaudid [Hydromorphone Hcl] Other (See Comments)    Pt says medication causes her to shake uncontrollably.  . Ibuprofen     Gi upset  . Tramadol Nausea Only and Other (See Comments)    Causes headache.  . Vicodin [Hydrocodone-Acetaminophen] Other (See Comments)    Causes headache.    Medications Prior to Admission  Medication Sig Dispense Refill Last Dose  . buprenorphine-naloxone (SUBOXONE) 8-2 mg SUBL SL tablet Place 1 tablet under the tongue daily.   05/03/2019 at Unknown time  . Prenatal Vit-Fe Phos-FA-Omega (VITAFOL GUMMIES) 3.33-0.333-34.8 MG CHEW Chew 3 tablets by mouth daily. 90 tablet 11 05/03/2019 at Unknown time  . triamterene-hydrochlorothiazide (DYAZIDE) 50-25 MG per capsule Take 1 capsule by mouth every morning.   Past Week at Unknown time  . Blood Pressure KIT Monitor blood pressure (Patient not taking: Reported on 04/18/2019) 1 kit 0   .  Doxylamine-Pyridoxine (DICLEGIS) 10-10 MG TBEC 1 tab in AM, 1 tab mid afternoon 2 tabs at bedtime. Max dose 4 tabs daily. 100 tablet 2   . labetalol (NORMODYNE) 200 MG tablet Take 1 tablet (200 mg total) by mouth 2 (two) times daily. (Patient not taking: Reported on 03/03/2019) 180 tablet 1  at not taking    Review of Systems  Constitutional: Negative.   HENT: Negative.   Eyes: Negative.   Respiratory: Negative.   Cardiovascular: Negative.   Gastrointestinal: Positive for abdominal pain (crampy and intermittent). Negative for abdominal distention, blood in stool, constipation, diarrhea, nausea, rectal pain and vomiting.  Endocrine: Negative.   Genitourinary: Negative.   Musculoskeletal: Negative.   Skin: Negative.   Allergic/Immunologic: Negative.   Neurological: Negative.   Hematological: Negative.   Psychiatric/Behavioral: Negative.    Physical Exam   Pulse 83, temperature 98.3 F (36.8 C), temperature source Oral, resp. rate 16, height '5\' 9"'$  (1.753 m), weight 129 kg, last menstrual period 01/29/2019, SpO2 99 %, unknown if currently breastfeeding.  Physical Exam  Nursing note and vitals reviewed. Constitutional: She is oriented to person, place, and time. She appears well-developed and well-nourished.  HENT:  Head: Normocephalic and atraumatic.  Eyes: Pupils are equal, round, and reactive to light. Conjunctivae and EOM are normal.  Neck: Normal range of motion. Neck supple.  Cardiovascular: Normal rate and regular rhythm.  Respiratory: Effort normal and breath sounds normal.  GI: Soft. She exhibits no distension and no mass. There is no abdominal tenderness. There is no rebound and no guarding.  Musculoskeletal: Normal range of motion.  Neurological: She is alert and oriented to person, place, and time.  Skin: Skin is warm and dry.  Psychiatric: She has a normal mood and affect. Her behavior is normal.   Results for orders placed or performed during the hospital encounter of  05/03/19 (from the past 24 hour(s))  Urinalysis, Routine w reflex microscopic     Status: Abnormal   Collection Time: 05/03/19 11:18 AM  Result Value Ref Range   Color, Urine AMBER (A) YELLOW   APPearance CLEAR CLEAR   Specific Gravity, Urine 1.043 (H) 1.005 - 1.030   pH 5.0 5.0 - 8.0   Glucose, UA NEGATIVE NEGATIVE mg/dL   Hgb urine dipstick NEGATIVE NEGATIVE   Bilirubin Urine SMALL (A) NEGATIVE   Ketones, ur 5 (A) NEGATIVE mg/dL   Protein, ur 30 (A) NEGATIVE mg/dL   Nitrite NEGATIVE NEGATIVE   Leukocytes,Ua NEGATIVE NEGATIVE   RBC /  HPF 6-10 0 - 5 RBC/hpf   WBC, UA 0-5 0 - 5 WBC/hpf   Bacteria, UA RARE (A) NONE SEEN   Squamous Epithelial / LPF 6-10 0 - 5   Mucus PRESENT   Protein / creatinine ratio, urine     Status: None   Collection Time: 05/03/19 11:18 AM  Result Value Ref Range   Creatinine, Urine 568.27 mg/dL   Total Protein, Urine 25 mg/dL   Protein Creatinine Ratio 0.04 0.00 - 0.15 mg/mg[Cre]  CBC     Status: Abnormal   Collection Time: 05/03/19 12:19 PM  Result Value Ref Range   WBC 12.8 (H) 4.0 - 10.5 K/uL   RBC 3.70 (L) 3.87 - 5.11 MIL/uL   Hemoglobin 12.0 12.0 - 15.0 g/dL   HCT 35.2 (L) 36.0 - 46.0 %   MCV 95.1 80.0 - 100.0 fL   MCH 32.4 26.0 - 34.0 pg   MCHC 34.1 30.0 - 36.0 g/dL   RDW 12.8 11.5 - 15.5 %   Platelets 246 150 - 400 K/uL   nRBC 0.0 0.0 - 0.2 %  Comprehensive metabolic panel     Status: Abnormal   Collection Time: 05/03/19 12:19 PM  Result Value Ref Range   Sodium 139 135 - 145 mmol/L   Potassium 3.9 3.5 - 5.1 mmol/L   Chloride 105 98 - 111 mmol/L   CO2 24 22 - 32 mmol/L   Glucose, Bld 99 70 - 99 mg/dL   BUN 9 6 - 20 mg/dL   Creatinine, Ser 0.77 0.44 - 1.00 mg/dL   Calcium 9.0 8.9 - 10.3 mg/dL   Total Protein 5.9 (L) 6.5 - 8.1 g/dL   Albumin 2.9 (L) 3.5 - 5.0 g/dL   AST 15 15 - 41 U/L   ALT 13 0 - 44 U/L   Alkaline Phosphatase 66 38 - 126 U/L   Total Bilirubin 0.6 0.3 - 1.2 mg/dL   GFR calc non Af Amer >60 >60 mL/min   GFR calc Af  Amer >60 >60 mL/min   Anion gap 10 5 - 15   US Ob Less Than 14 Weeks With Ob Transvaginal  Result Date: 05/03/2019 CLINICAL DATA:  Abdominal pain in pregnancy. EXAM: OBSTETRIC <14 WK [place] AND TRANSVAGINAL OB US TECHNIQUE: Both transabdominal and transvaginal ultrasound examinations were performed for complete evaluation of the gestation as well as the maternal uterus, adnexal regions, and pelvic cul-de-sac. Transvaginal technique was performed to assess early pregnancy. COMPARISON:  Ultrasound of March 15, 2019. FINDINGS: Intrauterine gestational sac: Single visualized. Yolk sac:  Visualized. Embryo:  Visualized. Cardiac Activity: Visualized. Heart Rate: 145 bpm CRL: 80.8 mm 14 w 0 d [place] EDC: November 01, 2019. Subchorionic hemorrhage:  None visualized. Maternal uterus/adnexae: No free fluid is noted. Corpus luteum cyst seen in left ovary. Normal right ovary. IMPRESSION: Single live intrauterine gestation of 14 weeks 0 days. Electronically Signed   By: Marijo Conception M.D.   On: 05/03/2019 14:47    MAU Course  Procedures  MDM CBC to r/o infection, CMP, UA and UC [place] to assess fetal status  Reassessment: After drinking a pitcher of water, the patient's cramping had improved. Assessment and Plan  38 yo [id number] at 40w3dpresenting for abdominal cramping. Urinalysis suspicious for infection, so culture is sent and we will treat with Keflex.  She was encouraged to drink when she is thirsty, and to call her doctor's office if there is any problems with her blood pressure.  Return precautions given.  Follow up as scheduled on 05/16/19  Belva Koziel L Jayce Boyko 05/03/2019, 11:55 AM

## 2019-05-05 LAB — CULTURE, OB URINE: Culture: 80000 — AB

## 2019-05-16 ENCOUNTER — Other Ambulatory Visit: Payer: Self-pay

## 2019-05-16 ENCOUNTER — Telehealth (INDEPENDENT_AMBULATORY_CARE_PROVIDER_SITE_OTHER): Payer: Medicaid Other | Admitting: Obstetrics and Gynecology

## 2019-05-16 VITALS — BP 128/85 | HR 73

## 2019-05-16 DIAGNOSIS — O09522 Supervision of elderly multigravida, second trimester: Secondary | ICD-10-CM

## 2019-05-16 DIAGNOSIS — Z3A15 15 weeks gestation of pregnancy: Secondary | ICD-10-CM

## 2019-05-16 DIAGNOSIS — O10912 Unspecified pre-existing hypertension complicating pregnancy, second trimester: Secondary | ICD-10-CM

## 2019-05-16 DIAGNOSIS — O09529 Supervision of elderly multigravida, unspecified trimester: Secondary | ICD-10-CM

## 2019-05-16 DIAGNOSIS — O10919 Unspecified pre-existing hypertension complicating pregnancy, unspecified trimester: Secondary | ICD-10-CM

## 2019-05-16 DIAGNOSIS — O99322 Drug use complicating pregnancy, second trimester: Secondary | ICD-10-CM

## 2019-05-16 DIAGNOSIS — F1120 Opioid dependence, uncomplicated: Secondary | ICD-10-CM

## 2019-05-16 DIAGNOSIS — O34219 Maternal care for unspecified type scar from previous cesarean delivery: Secondary | ICD-10-CM

## 2019-05-16 DIAGNOSIS — O99332 Smoking (tobacco) complicating pregnancy, second trimester: Secondary | ICD-10-CM

## 2019-05-16 DIAGNOSIS — O99320 Drug use complicating pregnancy, unspecified trimester: Secondary | ICD-10-CM

## 2019-05-16 NOTE — Progress Notes (Signed)
I connected with  Sydney Griffin on 05/16/19 by a video enabled telemedicine application and verified that I am speaking with the correct person using two identifiers.   MyChart ROB.  Reports no problems today.

## 2019-05-16 NOTE — Progress Notes (Signed)
TELEHEALTH OBSTETRICS PRENATAL VIRTUAL VIDEO VISIT ENCOUNTER NOTE  [facility]   I connected with Sydney Griffin on 05/16/19 at  1:30 PM EDT by MyChart Video Encounter at home and verified that I am speaking with the correct person using two identifiers.   I discussed the limitations, risks, security and privacy concerns of performing an evaluation and management service virtually and the availability of in person appointments. I also discussed with the patient that there may be a patient responsible charge related to this service. The patient expressed understanding and agreed to proceed. Subjective:  Sydney Griffin is a 38 y.o. [phone] at 7775w6d being seen today for ongoing prenatal care.  She is currently monitored for the following issues for this high-risk pregnancy and has Previous cesarean delivery, antepartum; Morbid obesity (HCC); Tobacco abuse; Carpal tunnel syndrome; Menorrhagia with regular cycle; Encounter for supervision of high risk multigravida of advanced maternal age, antepartum; Chronic hypertension during pregnancy; Tobacco use complicating pregnancy; and Suboxone maintenance treatment complicating pregnancy, antepartum (HCC) on their problem list.  Patient reports no complaints.  Contractions: Not present. Vag. Bleeding: None.  Movement: Present. Denies any leaking of fluid.   The following portions of the patient's history were reviewed and updated as appropriate: allergies, current medications, past family history, past medical history, past social history, past surgical history and problem list.   Objective:   Vitals:   05/16/19 1124  BP: 128/85  Pulse: 73    Fetal Status:     Movement: Present     General:  Alert, oriented and cooperative. Patient is in no acute distress.  Respiratory: Normal respiratory effort, no problems with respiration noted  Mental Status: Normal mood and affect. Normal behavior. Normal  judgment and thought content.  Rest of physical exam deferred due to type of encounter  Imaging: [place] Ob Less Than 14 Weeks With Ob Transvaginal  Result Date: 05/03/2019 CLINICAL DATA:  Abdominal pain in pregnancy. EXAM: OBSTETRIC <14 WK US AND TRANSVAGINAL OB US TECHNIQUE: Both transabdominal and transvaginal ultrasound examinations were performed for complete evaluation of the gestation as well as the maternal uterus, adnexal regions, and pelvic cul-de-sac. Transvaginal technique was performed to assess early pregnancy. COMPARISON:  Ultrasound of March 15, 2019. FINDINGS: Intrauterine gestational sac: Single visualized. Yolk sac:  Visualized. Embryo:  Visualized. Cardiac Activity: Visualized. Heart Rate: 145 bpm CRL: 80.8 mm 14 w 0 d US EDC: November 01, 2019. Subchorionic hemorrhage:  None visualized. Maternal uterus/adnexae: No free fluid is noted. Corpus luteum cyst seen in left ovary. Normal right ovary. IMPRESSION: Single live intrauterine gestation of 14 weeks 0 days. Electronically Signed   By: Lupita RaiderJames  Green Jr M.D.   On: 05/03/2019 14:47    Assessment and Plan:  Pregnancy: [id number] at 10775w6d 1. Encounter for supervision of high risk multigravida of advanced maternal age, antepartum Patient is doing well without complaints Anatomy ultrasound scheduled 10/7 with AFP  2. Chronic hypertension during pregnancy Patient has not been taking labetalol. BP normal today. Will continue to monitor Patient with allergy to ASA  3. Previous cesarean delivery, antepartum   4. Suboxone maintenance treatment complicating pregnancy, antepartum (HCC) Stable. Patient is being followed in a suboxone clinic  5. Pregnancy complicated by tobacco use in second trimester   Preterm labor symptoms and general obstetric precautions including but not limited to vaginal bleeding, contractions, leaking of fluid and fetal movement were reviewed in detail with the patient. I discussed the assessment and treatment  plan  with the patient. The patient was provided an opportunity to ask questions and all were answered. The patient agreed with the plan and demonstrated an understanding of the instructions. The patient was advised to call back or seek an in-person office evaluation/go to [facility] at [facility] for any urgent or concerning symptoms. Please refer to After Visit Summary for other counseling recommendations.   I provided 15 minutes of face-to-face time during this encounter.  No follow-ups on file.  Future Appointments  Date Time Provider [facility]  06/14/2019  2:00 PM [place] [facility]  06/14/2019  2:00 PM [place] [facility]    Mora Bellman, MD [facility] for [facility], [place]

## 2019-05-17 DIAGNOSIS — F329 Major depressive disorder, single episode, unspecified: Secondary | ICD-10-CM | POA: Diagnosis not present

## 2019-05-29 DIAGNOSIS — F329 Major depressive disorder, single episode, unspecified: Secondary | ICD-10-CM | POA: Diagnosis not present

## 2019-05-29 DIAGNOSIS — Z5181 Encounter for therapeutic drug level monitoring: Secondary | ICD-10-CM | POA: Diagnosis not present

## 2019-06-12 ENCOUNTER — Other Ambulatory Visit: Payer: Self-pay | Admitting: *Deleted

## 2019-06-12 DIAGNOSIS — O99330 Smoking (tobacco) complicating pregnancy, unspecified trimester: Secondary | ICD-10-CM

## 2019-06-12 DIAGNOSIS — Z720 Tobacco use: Secondary | ICD-10-CM

## 2019-06-12 DIAGNOSIS — F1120 Opioid dependence, uncomplicated: Secondary | ICD-10-CM

## 2019-06-12 DIAGNOSIS — O09529 Supervision of elderly multigravida, unspecified trimester: Secondary | ICD-10-CM

## 2019-06-12 DIAGNOSIS — O10919 Unspecified pre-existing hypertension complicating pregnancy, unspecified trimester: Secondary | ICD-10-CM

## 2019-06-13 ENCOUNTER — Encounter: Payer: Self-pay | Admitting: Obstetrics and Gynecology

## 2019-06-13 ENCOUNTER — Telehealth (INDEPENDENT_AMBULATORY_CARE_PROVIDER_SITE_OTHER): Payer: Medicaid Other | Admitting: Obstetrics and Gynecology

## 2019-06-13 DIAGNOSIS — O10919 Unspecified pre-existing hypertension complicating pregnancy, unspecified trimester: Secondary | ICD-10-CM

## 2019-06-13 DIAGNOSIS — O34219 Maternal care for unspecified type scar from previous cesarean delivery: Secondary | ICD-10-CM

## 2019-06-13 DIAGNOSIS — F1120 Opioid dependence, uncomplicated: Secondary | ICD-10-CM

## 2019-06-13 DIAGNOSIS — O09529 Supervision of elderly multigravida, unspecified trimester: Secondary | ICD-10-CM | POA: Insufficient documentation

## 2019-06-13 DIAGNOSIS — O99322 Drug use complicating pregnancy, second trimester: Secondary | ICD-10-CM

## 2019-06-13 DIAGNOSIS — O09522 Supervision of elderly multigravida, second trimester: Secondary | ICD-10-CM

## 2019-06-13 DIAGNOSIS — O10912 Unspecified pre-existing hypertension complicating pregnancy, second trimester: Secondary | ICD-10-CM

## 2019-06-13 DIAGNOSIS — Z3A19 19 weeks gestation of pregnancy: Secondary | ICD-10-CM

## 2019-06-13 DIAGNOSIS — O99320 Drug use complicating pregnancy, unspecified trimester: Secondary | ICD-10-CM

## 2019-06-13 NOTE — Progress Notes (Signed)
[place] VIRTUAL VIDEO VISIT ENCOUNTER NOTE  Provider location: [facility] [place] [facility] [place]   I connected with Marcene Duos on 06/13/19 at  1:30 PM EDT by MyChart Video Encounter at home and verified that I am speaking with the correct person using two identifiers.   I discussed the limitations, risks, security and privacy concerns of performing an evaluation and management service virtually and the availability of in person appointments. I also discussed with the patient that there may be a patient responsible charge related to this service. The patient expressed understanding and agreed to proceed. Subjective:  Sydney Griffin is a 38 y.o. [phone] at [redacted]w[redacted]d being seen today for ongoing prenatal care.  She is currently monitored for the following issues for this high-risk pregnancy and has Previous cesarean delivery, antepartum; Morbid obesity ([place]); Tobacco abuse; Carpal tunnel syndrome; Encounter for supervision of high risk multigravida of advanced maternal age, antepartum; Chronic hypertension during pregnancy; Tobacco use complicating pregnancy; Suboxone maintenance treatment complicating pregnancy, antepartum ([place]); and AMA (advanced maternal age) multigravida 35+ on their problem list.  Patient reports some N/V but not taking meds. .  Contractions: Not present. Vag. Bleeding: None.  Movement: Present. Denies any leaking of fluid.   The following portions of the patient's history were reviewed and updated as appropriate: allergies, current medications, past family history, past medical history, past social history, past surgical history and problem list.   Objective:  There were no vitals filed for this visit.  Fetal Status: Fetal Heart Rate (bpm): p   Movement: Present     General:  Alert, oriented and cooperative. Patient is in no acute distress.  Respiratory: Normal respiratory effort, no problems with respiration noted  Mental Status: Normal  mood and affect. Normal behavior. Normal judgment and thought content.  Rest of physical exam deferred due to type of encounter  Imaging: No results found.  Assessment and Plan:  Pregnancy: [id number] at [redacted]w[redacted]d 1. Encounter for supervision of high risk multigravida of advanced maternal age, antepartum Stable Anatomy scan and AFP tomorrow Instructed to use her Diclegis  2. Chronic hypertension during pregnancy Stable Not taking Labetalol. Reports BP's normal at home  3. Previous cesarean delivery, antepartum Will need repeat at 39 weeks  4. Suboxone maintenance treatment complicating pregnancy, antepartum (HCC) Stable  5. Multigravida of advanced maternal age in second trimester AFP with anatomy scan   Preterm labor symptoms and general obstetric precautions including but not limited to vaginal bleeding, contractions, leaking of fluid and fetal movement were reviewed in detail with the patient. I discussed the assessment and treatment plan with the patient. The patient was provided an opportunity to ask questions and all were answered. The patient agreed with the plan and demonstrated an understanding of the instructions. The patient was advised to call back or seek an in-person office evaluation/go to MAU at [facility] for any urgent or concerning symptoms. Please refer to After Visit Summary for other counseling recommendations.   I provided 8 minutes of face-to-face time during this encounter.  Return in about 4 weeks (around 07/11/2019) for OB visit,face to face.  Future Appointments  Date Time Provider [place]  06/13/2019  1:30 PM Chancy Milroy, MD [place] None  06/14/2019  1:30 PM [facility]OC[facility] LAB [place] WOC  06/14/2019  2:00 PM [place] NURSE [place] MFC-US  06/14/2019  2:00 PM [place], [place] for [employer], [facility]  [facility]

## 2019-06-13 NOTE — Progress Notes (Signed)
Virtual Visit via Telephone Note  I connected with Sydney Griffin on 06/13/19 at  1:30 PM EDT by telephone and verified that I am speaking with the correct person using two identifiers.  Pt c/o nausea and vomiting causing decreased appetite.  Anatomy scheduled for tomorrow.  Unable to check BP, she's not at home.

## 2019-06-14 ENCOUNTER — Other Ambulatory Visit: Payer: Medicaid Other

## 2019-06-14 ENCOUNTER — Other Ambulatory Visit (HOSPITAL_COMMUNITY): Payer: Self-pay | Admitting: *Deleted

## 2019-06-14 ENCOUNTER — Ambulatory Visit (HOSPITAL_COMMUNITY): Payer: Medicaid Other | Admitting: *Deleted

## 2019-06-14 ENCOUNTER — Encounter (HOSPITAL_COMMUNITY): Payer: Self-pay

## 2019-06-14 ENCOUNTER — Ambulatory Visit (HOSPITAL_COMMUNITY)
Admission: RE | Admit: 2019-06-14 | Discharge: 2019-06-14 | Disposition: A | Discharge status: Home / Self Care | Payer: Medicaid Other | Source: Ambulatory Visit | Attending: Obstetrics and Gynecology | Admitting: Obstetrics and Gynecology

## 2019-06-14 ENCOUNTER — Other Ambulatory Visit: Payer: Self-pay

## 2019-06-14 VITALS — BP 133/76 | HR 109 | Temp 98.0°F

## 2019-06-14 DIAGNOSIS — O99212 Obesity complicating pregnancy, second trimester: Secondary | ICD-10-CM | POA: Diagnosis not present

## 2019-06-14 DIAGNOSIS — O99320 Drug use complicating pregnancy, unspecified trimester: Secondary | ICD-10-CM | POA: Insufficient documentation

## 2019-06-14 DIAGNOSIS — O09522 Supervision of elderly multigravida, second trimester: Secondary | ICD-10-CM

## 2019-06-14 DIAGNOSIS — Z3A19 19 weeks gestation of pregnancy: Secondary | ICD-10-CM | POA: Diagnosis not present

## 2019-06-14 DIAGNOSIS — O10012 Pre-existing essential hypertension complicating pregnancy, second trimester: Secondary | ICD-10-CM

## 2019-06-14 DIAGNOSIS — O34219 Maternal care for unspecified type scar from previous cesarean delivery: Secondary | ICD-10-CM

## 2019-06-14 DIAGNOSIS — O99322 Drug use complicating pregnancy, second trimester: Secondary | ICD-10-CM

## 2019-06-14 DIAGNOSIS — O09529 Supervision of elderly multigravida, unspecified trimester: Secondary | ICD-10-CM

## 2019-06-14 DIAGNOSIS — Z720 Tobacco use: Secondary | ICD-10-CM

## 2019-06-14 DIAGNOSIS — Z363 Encounter for antenatal screening for malformations: Secondary | ICD-10-CM

## 2019-06-14 DIAGNOSIS — F1120 Opioid dependence, uncomplicated: Secondary | ICD-10-CM

## 2019-06-14 DIAGNOSIS — O99330 Smoking (tobacco) complicating pregnancy, unspecified trimester: Secondary | ICD-10-CM

## 2019-06-14 DIAGNOSIS — O10919 Unspecified pre-existing hypertension complicating pregnancy, unspecified trimester: Secondary | ICD-10-CM

## 2019-06-16 LAB — AFP, SERUM, OPEN SPINA BIFIDA
AFP MoM: 1.11
AFP Value: 47.7 ng/mL
Gest. Age on Collection Date: 20 weeks
Maternal Age At EDD: 36.1 yr
OSBR Risk 1 IN: 10000
Test Results:: NEGATIVE
Weight: 286 [lb_av]

## 2019-06-19 DIAGNOSIS — F329 Major depressive disorder, single episode, unspecified: Secondary | ICD-10-CM | POA: Diagnosis not present

## 2019-06-26 ENCOUNTER — Telehealth: Payer: Self-pay

## 2019-06-26 ENCOUNTER — Other Ambulatory Visit: Payer: Self-pay | Admitting: Obstetrics

## 2019-06-26 DIAGNOSIS — O219 Vomiting of pregnancy, unspecified: Secondary | ICD-10-CM

## 2019-06-26 MED ORDER — METOCLOPRAMIDE HCL 10 MG PO TABS: 10.0000 mg | ORAL_TABLET | Freq: Three times a day (TID) | ORAL | 2 refills | Status: DC

## 2019-06-26 NOTE — Telephone Encounter (Signed)
Pt reports that the Diclegis is not helping with her N&V symptoms and she would like to know if there is something else she can take.

## 2019-06-26 NOTE — Telephone Encounter (Signed)
Reglan Rx

## 2019-07-05 DIAGNOSIS — F329 Major depressive disorder, single episode, unspecified: Secondary | ICD-10-CM | POA: Diagnosis not present

## 2019-07-10 DIAGNOSIS — F329 Major depressive disorder, single episode, unspecified: Secondary | ICD-10-CM | POA: Diagnosis not present

## 2019-07-12 ENCOUNTER — Other Ambulatory Visit: Payer: Self-pay

## 2019-07-12 ENCOUNTER — Encounter: Payer: Self-pay | Admitting: Obstetrics and Gynecology

## 2019-07-12 ENCOUNTER — Ambulatory Visit (INDEPENDENT_AMBULATORY_CARE_PROVIDER_SITE_OTHER): Payer: Medicaid Other | Admitting: Obstetrics and Gynecology

## 2019-07-12 VITALS — BP 117/71 | HR 84 | Wt 292.0 lb

## 2019-07-12 DIAGNOSIS — O09522 Supervision of elderly multigravida, second trimester: Secondary | ICD-10-CM

## 2019-07-12 DIAGNOSIS — O10912 Unspecified pre-existing hypertension complicating pregnancy, second trimester: Secondary | ICD-10-CM

## 2019-07-12 DIAGNOSIS — F1120 Opioid dependence, uncomplicated: Secondary | ICD-10-CM

## 2019-07-12 DIAGNOSIS — O34219 Maternal care for unspecified type scar from previous cesarean delivery: Secondary | ICD-10-CM

## 2019-07-12 DIAGNOSIS — O09529 Supervision of elderly multigravida, unspecified trimester: Secondary | ICD-10-CM

## 2019-07-12 DIAGNOSIS — O10919 Unspecified pre-existing hypertension complicating pregnancy, unspecified trimester: Secondary | ICD-10-CM

## 2019-07-12 DIAGNOSIS — O99322 Drug use complicating pregnancy, second trimester: Secondary | ICD-10-CM

## 2019-07-12 DIAGNOSIS — Z3A23 23 weeks gestation of pregnancy: Secondary | ICD-10-CM

## 2019-07-12 DIAGNOSIS — O219 Vomiting of pregnancy, unspecified: Secondary | ICD-10-CM

## 2019-07-12 MED ORDER — METOCLOPRAMIDE HCL 10 MG PO TABS: 10.0000 mg | ORAL_TABLET | Freq: Three times a day (TID) | ORAL | 2 refills | Status: DC

## 2019-07-12 NOTE — Progress Notes (Addendum)
   PRENATAL VISIT NOTE  Subjective:  Sydney Griffin is a 38 y.o. [phone] at [redacted]w[redacted]d being seen today for ongoing prenatal care.  She is currently monitored for the following issues for this high-risk pregnancy and has Previous cesarean delivery, antepartum; Morbid obesity ([place]); Tobacco abuse; Carpal tunnel syndrome; Encounter for supervision of high risk multigravida of advanced maternal age, antepartum; Chronic hypertension during pregnancy; Tobacco use complicating pregnancy; Suboxone maintenance treatment complicating pregnancy, antepartum ([place]); and AMA (advanced maternal age) multigravida 35+ on their problem list.  Patient reports no complaints.  Contractions: Not present. Vag. Bleeding: None.  Movement: Present. Denies leaking of fluid.   The following portions of the patient's history were reviewed and updated as appropriate: allergies, current medications, past family history, past medical history, past social history, past surgical history and problem list.   Objective:   Vitals:   07/12/19 1325  BP: 117/71  Pulse: 84  Weight: 292 lb (132.5 kg)   Fetal Status: Fetal Heart Rate (bpm): 141   Movement: Present     General:  Alert, oriented and cooperative. Patient is in no acute distress.  Skin: Skin is warm and dry. No rash noted.   Cardiovascular: Normal heart rate noted  Respiratory: Normal respiratory effort, no problems with respiration noted  Abdomen: Soft, gravid, appropriate for gestational age.  Pain/Pressure: Absent     Pelvic: Cervical exam deferred        Extremities: Normal range of motion.  Edema: Trace  Mental Status: Normal mood and affect. Normal behavior. Normal judgment and thought content.   Assessment and Plan:  Pregnancy: [id number] at [redacted]w[redacted]d  1. Encounter for supervision of high risk multigravida of advanced maternal age, antepartum Considering BTL . 2. Suboxone maintenance treatment complicating pregnancy, antepartum ([place]) stable  3. Multigravida of  advanced maternal age in second trimester  4. Previous cesarean delivery, antepartum RCS scheduled today  5. Chronic hypertension during pregnancy Was on labetalol but not taking Not able to take baby ASA, allergy BP stable F/u growth scheduled for 07/19/19   Preterm labor symptoms and general obstetric precautions including but not limited to vaginal bleeding, contractions, leaking of fluid and fetal movement were reviewed in detail with the patient. Please refer to After Visit Summary for other counseling recommendations.   Return in about 4 weeks (around 08/09/2019) for in person, high OB, 2 hr GTT, 3rd trim labs.  Future Appointments  Date Time Provider [place]  07/19/2019 11:00 AM [place] [facility] MFC-US  07/19/2019 11:00 AM [place] 3 WH-MFCUS MFC-US    Sloan Leiter, MD

## 2019-07-19 ENCOUNTER — Encounter (HOSPITAL_COMMUNITY): Payer: Self-pay

## 2019-07-19 ENCOUNTER — Other Ambulatory Visit (HOSPITAL_COMMUNITY): Payer: Self-pay | Admitting: *Deleted

## 2019-07-19 ENCOUNTER — Ambulatory Visit (HOSPITAL_COMMUNITY): Payer: Medicaid Other | Admitting: *Deleted

## 2019-07-19 ENCOUNTER — Other Ambulatory Visit: Payer: Self-pay

## 2019-07-19 ENCOUNTER — Ambulatory Visit (HOSPITAL_COMMUNITY)
Admission: RE | Admit: 2019-07-19 | Discharge: 2019-07-19 | Disposition: A | Discharge status: Home / Self Care | Payer: Medicaid Other | Source: Ambulatory Visit | Attending: Obstetrics and Gynecology | Admitting: Obstetrics and Gynecology

## 2019-07-19 VITALS — BP 117/79 | HR 90 | Temp 98.4°F

## 2019-07-19 DIAGNOSIS — O09522 Supervision of elderly multigravida, second trimester: Secondary | ICD-10-CM

## 2019-07-19 DIAGNOSIS — O10012 Pre-existing essential hypertension complicating pregnancy, second trimester: Secondary | ICD-10-CM | POA: Diagnosis not present

## 2019-07-19 DIAGNOSIS — O99322 Drug use complicating pregnancy, second trimester: Secondary | ICD-10-CM | POA: Diagnosis not present

## 2019-07-19 DIAGNOSIS — F1120 Opioid dependence, uncomplicated: Secondary | ICD-10-CM | POA: Diagnosis present

## 2019-07-19 DIAGNOSIS — O99320 Drug use complicating pregnancy, unspecified trimester: Secondary | ICD-10-CM | POA: Diagnosis present

## 2019-07-19 DIAGNOSIS — O0990 Supervision of high risk pregnancy, unspecified, unspecified trimester: Secondary | ICD-10-CM

## 2019-07-19 DIAGNOSIS — Z362 Encounter for other antenatal screening follow-up: Secondary | ICD-10-CM

## 2019-07-19 DIAGNOSIS — Z3A24 24 weeks gestation of pregnancy: Secondary | ICD-10-CM

## 2019-07-19 DIAGNOSIS — O34219 Maternal care for unspecified type scar from previous cesarean delivery: Secondary | ICD-10-CM

## 2019-07-19 DIAGNOSIS — O99332 Smoking (tobacco) complicating pregnancy, second trimester: Secondary | ICD-10-CM

## 2019-08-02 DIAGNOSIS — Z5181 Encounter for therapeutic drug level monitoring: Secondary | ICD-10-CM | POA: Diagnosis not present

## 2019-08-09 ENCOUNTER — Encounter: Payer: Self-pay | Admitting: Obstetrics and Gynecology

## 2019-08-09 ENCOUNTER — Other Ambulatory Visit: Payer: Medicaid Other

## 2019-08-09 ENCOUNTER — Other Ambulatory Visit: Payer: Self-pay

## 2019-08-09 ENCOUNTER — Encounter: Payer: Self-pay | Admitting: *Deleted

## 2019-08-09 ENCOUNTER — Ambulatory Visit (INDEPENDENT_AMBULATORY_CARE_PROVIDER_SITE_OTHER): Payer: Medicaid Other | Admitting: Obstetrics and Gynecology

## 2019-08-09 VITALS — BP 133/82 | HR 76 | Wt 293.0 lb

## 2019-08-09 DIAGNOSIS — Z3A27 27 weeks gestation of pregnancy: Secondary | ICD-10-CM

## 2019-08-09 DIAGNOSIS — O10919 Unspecified pre-existing hypertension complicating pregnancy, unspecified trimester: Secondary | ICD-10-CM

## 2019-08-09 DIAGNOSIS — O09529 Supervision of elderly multigravida, unspecified trimester: Secondary | ICD-10-CM | POA: Diagnosis not present

## 2019-08-09 DIAGNOSIS — O10912 Unspecified pre-existing hypertension complicating pregnancy, second trimester: Secondary | ICD-10-CM

## 2019-08-09 DIAGNOSIS — F1120 Opioid dependence, uncomplicated: Secondary | ICD-10-CM

## 2019-08-09 DIAGNOSIS — Z23 Encounter for immunization: Secondary | ICD-10-CM

## 2019-08-09 DIAGNOSIS — O99320 Drug use complicating pregnancy, unspecified trimester: Secondary | ICD-10-CM

## 2019-08-09 DIAGNOSIS — F329 Major depressive disorder, single episode, unspecified: Secondary | ICD-10-CM | POA: Diagnosis not present

## 2019-08-09 DIAGNOSIS — O09523 Supervision of elderly multigravida, third trimester: Secondary | ICD-10-CM

## 2019-08-09 DIAGNOSIS — O09522 Supervision of elderly multigravida, second trimester: Secondary | ICD-10-CM

## 2019-08-09 DIAGNOSIS — O34219 Maternal care for unspecified type scar from previous cesarean delivery: Secondary | ICD-10-CM

## 2019-08-09 DIAGNOSIS — O99322 Drug use complicating pregnancy, second trimester: Secondary | ICD-10-CM

## 2019-08-09 NOTE — Progress Notes (Signed)
Pt is here for ROB and 2hr GTT [redacted]w[redacted]d.  

## 2019-08-09 NOTE — Progress Notes (Signed)
   PRENATAL VISIT NOTE  Subjective:  Sydney Griffin is a 38 y.o. [phone] at [redacted]w[redacted]d being seen today for ongoing prenatal care.  She is currently monitored for the following issues for this high-risk pregnancy and has Previous cesarean delivery, antepartum; Morbid obesity ([place]); Tobacco abuse; Carpal tunnel syndrome; Encounter for supervision of high risk multigravida of advanced maternal age, antepartum; Chronic hypertension during pregnancy; Tobacco use complicating pregnancy; Suboxone maintenance treatment complicating pregnancy, antepartum ([place]); and AMA (advanced maternal age) multigravida 35+ on their problem list.  Patient reports no complaints.  Contractions: Not present. Vag. Bleeding: None.  Movement: Present. Denies leaking of fluid.   The following portions of the patient's history were reviewed and updated as appropriate: allergies, current medications, past family history, past medical history, past social history, past surgical history and problem list.   Objective:   Vitals:   08/09/19 0914  BP: 133/82  Pulse: 76  Weight: 293 lb (132.9 kg)    Fetal Status: Fetal Heart Rate (bpm): 150 Fundal Height: 27 cm Movement: Present     General:  Alert, oriented and cooperative. Patient is in no acute distress.  Skin: Skin is warm and dry. No rash noted.   Cardiovascular: Normal heart rate noted  Respiratory: Normal respiratory effort, no problems with respiration noted  Abdomen: Soft, gravid, appropriate for gestational age.  Pain/Pressure: Absent     Pelvic: Cervical exam deferred        Extremities: Normal range of motion.  Edema: Trace  Mental Status: Normal mood and affect. Normal behavior. Normal judgment and thought content.   Assessment and Plan:  Pregnancy: [id number] at [redacted]w[redacted]d 1. Encounter for supervision of high risk multigravida of advanced maternal age, antepartum Patient is doing well without complaints Plans IUD for contraception Third trimester labs today  - 2  Hour GTT - CBC - RPR - HIV antibody  2. Chronic hypertension during pregnancy Normotensive without medication Patient not on ASA due to allergy  3. Previous cesarean delivery, antepartum Will be scheduled for repeat at 39 weeks  4. Suboxone maintenance treatment complicating pregnancy, antepartum [facility]) Patient is stable and is being followed by a suboxone clinic  5. Multigravida of advanced maternal age in third trimester   Preterm labor symptoms and general obstetric precautions including but not limited to vaginal bleeding, contractions, leaking of fluid and fetal movement were reviewed in detail with the patient. Please refer to After Visit Summary for other counseling recommendations.   Return in about 2 weeks (around 08/23/2019) for Virtual, ROB, High risk.  Future Appointments  Date Time Provider [place]  08/16/2019 11:00 AM WH-MFC [place] 3 WH-MFCUS MFC-US  08/16/2019 11:10 AM WH-MFC NURSE WH-MFC MFC-US    Mora Bellman, MD

## 2019-08-10 LAB — HIV ANTIBODY (ROUTINE TESTING W REFLEX): HIV Screen 4th Generation wRfx: NONREACTIVE

## 2019-08-10 LAB — CBC
Hematocrit: 33.5 % — ABNORMAL LOW (ref 34.0–46.6)
Hemoglobin: 11.2 g/dL (ref 11.1–15.9)
MCH: 32.6 pg (ref 26.6–33.0)
MCHC: 33.4 g/dL (ref 31.5–35.7)
MCV: 97 fL (ref 79–97)
Platelets: 212 10*3/uL (ref 150–450)
RBC: 3.44 x10E6/uL — ABNORMAL LOW (ref 3.77–5.28)
RDW: 12.3 % (ref 11.7–15.4)
WBC: 10.3 10*3/uL (ref 3.4–10.8)

## 2019-08-10 LAB — GLUCOSE TOLERANCE, 2 HOURS W/ 1HR
Glucose, 1 hour: 103 mg/dL (ref 65–179)
Glucose, 2 hour: 92 mg/dL (ref 65–152)
Glucose, Fasting: 73 mg/dL (ref 65–91)

## 2019-08-10 LAB — RPR: RPR Ser Ql: NONREACTIVE

## 2019-08-16 ENCOUNTER — Other Ambulatory Visit: Payer: Self-pay

## 2019-08-16 ENCOUNTER — Other Ambulatory Visit (HOSPITAL_COMMUNITY): Payer: Self-pay | Admitting: *Deleted

## 2019-08-16 ENCOUNTER — Encounter (HOSPITAL_COMMUNITY): Payer: Self-pay

## 2019-08-16 ENCOUNTER — Ambulatory Visit (HOSPITAL_COMMUNITY)
Admission: RE | Admit: 2019-08-16 | Discharge: 2019-08-16 | Disposition: A | Discharge status: Home / Self Care | Payer: Medicaid Other | Source: Ambulatory Visit | Attending: Obstetrics and Gynecology | Admitting: Obstetrics and Gynecology

## 2019-08-16 ENCOUNTER — Ambulatory Visit (HOSPITAL_COMMUNITY): Payer: Medicaid Other | Admitting: *Deleted

## 2019-08-16 VITALS — BP 129/76 | HR 97 | Temp 97.9°F

## 2019-08-16 DIAGNOSIS — O99212 Obesity complicating pregnancy, second trimester: Secondary | ICD-10-CM | POA: Diagnosis not present

## 2019-08-16 DIAGNOSIS — F1120 Opioid dependence, uncomplicated: Secondary | ICD-10-CM | POA: Diagnosis present

## 2019-08-16 DIAGNOSIS — O99323 Drug use complicating pregnancy, third trimester: Secondary | ICD-10-CM | POA: Diagnosis present

## 2019-08-16 DIAGNOSIS — O09523 Supervision of elderly multigravida, third trimester: Secondary | ICD-10-CM | POA: Diagnosis present

## 2019-08-16 DIAGNOSIS — O99332 Smoking (tobacco) complicating pregnancy, second trimester: Secondary | ICD-10-CM | POA: Diagnosis not present

## 2019-08-16 DIAGNOSIS — O09522 Supervision of elderly multigravida, second trimester: Secondary | ICD-10-CM

## 2019-08-16 DIAGNOSIS — Z362 Encounter for other antenatal screening follow-up: Secondary | ICD-10-CM | POA: Insufficient documentation

## 2019-08-16 DIAGNOSIS — O99322 Drug use complicating pregnancy, second trimester: Secondary | ICD-10-CM | POA: Diagnosis not present

## 2019-08-16 DIAGNOSIS — O10012 Pre-existing essential hypertension complicating pregnancy, second trimester: Secondary | ICD-10-CM | POA: Diagnosis not present

## 2019-08-16 DIAGNOSIS — Z3A28 28 weeks gestation of pregnancy: Secondary | ICD-10-CM | POA: Diagnosis not present

## 2019-08-23 ENCOUNTER — Encounter: Payer: Self-pay | Admitting: Obstetrics and Gynecology

## 2019-08-23 ENCOUNTER — Telehealth (INDEPENDENT_AMBULATORY_CARE_PROVIDER_SITE_OTHER): Payer: Medicaid Other | Admitting: Obstetrics and Gynecology

## 2019-08-23 VITALS — BP 134/71 | HR 81

## 2019-08-23 DIAGNOSIS — O10913 Unspecified pre-existing hypertension complicating pregnancy, third trimester: Secondary | ICD-10-CM

## 2019-08-23 DIAGNOSIS — F1120 Opioid dependence, uncomplicated: Secondary | ICD-10-CM

## 2019-08-23 DIAGNOSIS — Z3A29 29 weeks gestation of pregnancy: Secondary | ICD-10-CM

## 2019-08-23 DIAGNOSIS — O09529 Supervision of elderly multigravida, unspecified trimester: Secondary | ICD-10-CM

## 2019-08-23 DIAGNOSIS — O10919 Unspecified pre-existing hypertension complicating pregnancy, unspecified trimester: Secondary | ICD-10-CM

## 2019-08-23 DIAGNOSIS — O99323 Drug use complicating pregnancy, third trimester: Secondary | ICD-10-CM

## 2019-08-23 DIAGNOSIS — O34219 Maternal care for unspecified type scar from previous cesarean delivery: Secondary | ICD-10-CM

## 2019-08-23 DIAGNOSIS — O09523 Supervision of elderly multigravida, third trimester: Secondary | ICD-10-CM

## 2019-08-23 MED ORDER — PROMETHAZINE HCL 25 MG PO TABS: 25.0000 mg | ORAL_TABLET | Freq: Four times a day (QID) | ORAL | 2 refills | Status: DC | PRN

## 2019-08-23 NOTE — Progress Notes (Signed)
   Patient reported having headache and vomiting x 2 today. Tylenol does not help with headache. Patient took Reglan with no relief. Pt reported she is not taking anything for blood pressure, she was told she could stop medication.  Derl Barrow, RN

## 2019-08-23 NOTE — Progress Notes (Addendum)
TELEHEALTH OBSTETRICS PRENATAL VIRTUAL VIDEO VISIT ENCOUNTER NOTE  Provider location: [facility] [place]   I connected with Sydney Griffin on 08/23/19 at  2:30 PM EST by MyChart Video Encounter at home and verified that I am speaking with the correct person using two identifiers.   I discussed the limitations, risks, security and privacy concerns of performing an evaluation and management service virtually and the availability of in person appointments. I also discussed with the patient that there may be a patient responsible charge related to this service. The patient expressed understanding and agreed to proceed. Subjective:  Sydney Griffin is a 38 y.o. [phone] at [redacted]w[redacted]d being seen today for ongoing prenatal care.  She is currently monitored for the following issues for this high-risk pregnancy and has Previous cesarean delivery, antepartum; Morbid obesity (HCC); Tobacco abuse; Carpal tunnel syndrome; Encounter for supervision of high risk multigravida of advanced maternal age, antepartum; Chronic hypertension during pregnancy; Tobacco use complicating pregnancy; Suboxone maintenance treatment complicating pregnancy, antepartum (HCC); and AMA (advanced maternal age) multigravida 35+ on their problem list.  Patient reports vomiting today, which is odd for her because she has been doing better the last few weeks. Also reports that her hernia is hurting and pushing out more. Hernia is somewhat tender t otouch.. Also with headache.   Contractions: Irregular. Vag. Bleeding: None.  Movement: Present. Denies any leaking of fluid.   The following portions of the patient's history were reviewed and updated as appropriate: allergies, current medications, past family history, past medical history, past social history, past surgical history and problem list.   Objective:   Vitals:   08/23/19 1422  BP: 134/71  Pulse: 81    Fetal Status:     Movement: Present     General:   Alert, oriented and cooperative. Patient is in no acute distress.  Respiratory: Normal respiratory effort, no problems with respiration noted  Mental Status: Normal mood and affect. Normal behavior. Normal judgment and thought content.  Rest of physical exam deferred due to type of encounter   Assessment and Plan:  Pregnancy: [id number] at [redacted]w[redacted]d  1. Encounter for supervision of high risk multigravida of advanced maternal age, antepartum - had hernia pain like this last pregnancy - Advised patient to present to hospital if hernia pain significantly worsens or if she has significnatly increased nausea/vomiting - advised to take tylenol and caffeine for headache, to hospital if no improvement  2. Multigravida of advanced maternal age in third trimester  3. Suboxone maintenance treatment complicating pregnancy, antepartum (HCC) Cont current dose  4. Previous cesarean delivery, antepartum RCS scheduled for Feb  5. Chronic hypertension during pregnancy Not taking baby ASA, ? allergy BP stable Cont labetalol 200 mg BID Next growth 09/14/19  Preterm labor symptoms and general obstetric precautions including but not limited to vaginal bleeding, contractions, leaking of fluid and fetal movement were reviewed in detail with the patient. I discussed the assessment and treatment plan with the patient. The patient was provided an opportunity to ask questions and all were answered. The patient agreed with the plan and demonstrated an understanding of the instructions. The patient was advised to call back or seek an in-person office evaluation/go to MAU at [facility] for any urgent or concerning symptoms. Please refer to After Visit Summary for other counseling recommendations.   I provided 15 minutes of face-to-face time during this encounter.  Return in about 2 weeks (around 09/06/2019) for high OB, virtual.  Future Appointments  Date Time Provider [place]  09/14/2019  12:45 PM [place] 2 WH-MFCUS MFC-US  09/14/2019 12:50 PM [place] NURSE [place] MFC-US    Sloan Leiter, [place] for [place], [place] [facility]

## 2019-08-23 NOTE — Addendum Note (Signed)
Addended by: Vivien Rota on: 08/23/2019 03:03 PM   Modules accepted: Orders

## 2019-09-06 ENCOUNTER — Telehealth (INDEPENDENT_AMBULATORY_CARE_PROVIDER_SITE_OTHER): Payer: Medicaid Other | Admitting: Obstetrics & Gynecology

## 2019-09-06 DIAGNOSIS — O10919 Unspecified pre-existing hypertension complicating pregnancy, unspecified trimester: Secondary | ICD-10-CM

## 2019-09-06 DIAGNOSIS — Z3A31 31 weeks gestation of pregnancy: Secondary | ICD-10-CM

## 2019-09-06 DIAGNOSIS — O99323 Drug use complicating pregnancy, third trimester: Secondary | ICD-10-CM

## 2019-09-06 DIAGNOSIS — O09523 Supervision of elderly multigravida, third trimester: Secondary | ICD-10-CM

## 2019-09-06 DIAGNOSIS — O10913 Unspecified pre-existing hypertension complicating pregnancy, third trimester: Secondary | ICD-10-CM

## 2019-09-06 DIAGNOSIS — O99333 Smoking (tobacco) complicating pregnancy, third trimester: Secondary | ICD-10-CM

## 2019-09-06 DIAGNOSIS — Z720 Tobacco use: Secondary | ICD-10-CM

## 2019-09-06 DIAGNOSIS — O09529 Supervision of elderly multigravida, unspecified trimester: Secondary | ICD-10-CM

## 2019-09-06 DIAGNOSIS — F1120 Opioid dependence, uncomplicated: Secondary | ICD-10-CM

## 2019-09-06 DIAGNOSIS — O34219 Maternal care for unspecified type scar from previous cesarean delivery: Secondary | ICD-10-CM

## 2019-09-06 DIAGNOSIS — O99330 Smoking (tobacco) complicating pregnancy, unspecified trimester: Secondary | ICD-10-CM

## 2019-09-06 NOTE — Progress Notes (Signed)
S/w pt for virtual visit. Pt reports fetal movement, denies pain. Pt states that she does not have BP cuff with her right now.

## 2019-09-06 NOTE — Progress Notes (Signed)
TELEHEALTH OBSTETRICS PRENATAL VIRTUAL VIDEO VISIT ENCOUNTER NOTE  Provider location: Center for [facility] [place]   I connected with Sydney Griffin on 09/06/19 at  3:15 PM EST by MyChart Video Encounter at home and verified that I am speaking with the correct person using two identifiers.   I discussed the limitations, risks, security and privacy concerns of performing an evaluation and management service virtually and the availability of in person appointments. I also discussed with the patient that there may be a patient responsible charge related to this service. The patient expressed understanding and agreed to proceed. Subjective:  Sydney Griffin is a 38 y.o. [phone] at [redacted]w[redacted]d being seen today for ongoing prenatal care.  She is currently monitored for the following issues for this high-risk pregnancy and has Previous cesarean delivery, antepartum; Morbid obesity (HCC); Tobacco abuse; Carpal tunnel syndrome; Encounter for supervision of high risk multigravida of advanced maternal age, antepartum; Chronic hypertension during pregnancy; Tobacco use complicating pregnancy; Suboxone maintenance treatment complicating pregnancy, antepartum (HCC); and AMA (advanced maternal age) multigravida 35+ on their problem list.  Patient reports no complaints.  Contractions: Not present. Vag. Bleeding: None.  Movement: Present. Denies any leaking of fluid.   The following portions of the patient's history were reviewed and updated as appropriate: allergies, current medications, past family history, past medical history, past social history, past surgical history and problem list.   Objective:  There were no vitals filed for this visit.  Fetal Status:     Movement: Present     General:  Alert, oriented and cooperative. Patient is in no acute distress.  Respiratory: Normal respiratory effort, no problems with respiration noted  Mental Status: Normal mood and affect. Normal behavior. Normal  judgment and thought content.  Rest of physical exam deferred due to type of encounter  Imaging: [place] MFM OB FOLLOW UP  Result Date: 08/16/2019 ----------------------------------------------------------------------  OBSTETRICS REPORT                       (Signed Final 08/16/2019 12:08 pm) ---------------------------------------------------------------------- Patient Info  ID #:       [id number]                          D.O.B.:  March 10, 1981 (37 yrs)  Name:       Sydney Griffin               Visit Date: 08/16/2019 11:24 am ---------------------------------------------------------------------- Performed By  Performed By:     Sandi Mealy        Ref. Address:     [address]                    [place]                                                             [address]  Attending:        Noralee Space MD        [facility]  Referred By:      [facility] ---------------------------------------------------------------------- Orders   #  Description                          Code         Ordered By   1  [place] MFM OB FOLLOW UP                  [phone]     YU FANG  ----------------------------------------------------------------------   #  Order #                    Accession #                 Episode #   1  [id number]                  [id number]                  [id number]  ---------------------------------------------------------------------- Indications   Encounter for other antenatal screening        Z36.2   follow-up   Hypertension - Chronic/Pre-existing            E70.350   Obesity complicating pregnancy, second         O99.212   trimester (BMI 94)   Suboxone use                                   O99.320   Advanced maternal age multigravida 26+,        O55.522   second trimester   Previous cesarean delivery, antepartum X 3     O34.219   Tobacco use complicating pregnancy,            O99.332   second trimester   [redacted] weeks  gestation of pregnancy                Z3A.28  ---------------------------------------------------------------------- Fetal Evaluation  Num Of Fetuses:         1  Fetal Heart Rate(bpm):  143  Cardiac Activity:       Observed  Presentation:           Cephalic  Placenta:               Anterior  P. Cord Insertion:      Previously Visualized  Amniotic Fluid  AFI FV:      Within normal limits  AFI Sum(cm)     %Tile       Largest Pocket(cm)  16.49           60          4.84  RUQ(cm)       RLQ(cm)       LUQ(cm)        LLQ(cm)  4.84          4.11          4.22           3.32 ---------------------------------------------------------------------- Biometry  BPD:      70.7  mm     G. Age:  28w 3d         41  %    CI:        73.07   %    70 - 86  FL/HC:      20.5   %    18.8 - 20.6  HC:      262.9  mm     G. Age:  28w 4d         28  %    HC/AC:      1.00        1.05 - 1.21  AC:      263.5  mm     G. Age:  30w 3d         94  %    FL/BPD:     76.4   %    71 - 87  FL:         54  mm     G. Age:  28w 4d         44  %    FL/AC:      20.5   %    20 - 24  HUM:      50.8  mm     G. Age:  29w 5d         75  %  LV:        1.7  mm  Est. FW:    1410  gm      3 lb 2 oz     83  % ---------------------------------------------------------------------- OB History  Gravidity:    6         Term:   3        Prem:   0        SAB:   0  TOP:          0       Ectopic:  2        Living: 3 ---------------------------------------------------------------------- Gestational Age  U/S Today:     29w 0d                                        EDD:   11/01/19  Best:          28w 2d     Det. ByMarcella Dubs:  Early Ultrasound         EDD:   11/06/19                                      (03/15/19) ---------------------------------------------------------------------- Anatomy  Cranium:               Appears normal         LVOT:                   Previously seen  Cavum:                 Appears normal         Aortic Arch:             Previously seen  Ventricles:            Appears normal         Ductal Arch:            Previously seen  Choroid Plexus:        Previously seen        Diaphragm:              Appears normal  Cerebellum:            Previously seen        Stomach:                Appears normal, left                                                                        sided  Posterior Fossa:       Previously seen        Abdomen:                Appears normal  Nuchal Fold:           Not applicable (>20    Abdominal Wall:         Previously seen                         wks GA)  Face:                  Orbits and profile     Cord Vessels:           Previously seen                         previously seen  Lips:                  Previously seen        Kidneys:                Appear normal  Palate:                Previously seen        Bladder:                Appears normal  Thoracic:              Appears normal         Spine:                  Previously seen  Heart:                 Appears normal         Upper Extremities:      Previously seen                         (4CH, axis, and                         situs)  RVOT:                  Previously seen        Lower Extremities:      Previously seen  Other:  Technically difficult due to maternal habitus and fetal position. ---------------------------------------------------------------------- Impression  Suboxone maintenance. Patient does not have gestational  diabetes.  Amniotic fluid is normal and good fetal activity is seen. Fetal  growth is appropriate for gestational age.  Placenta is anterior and there is no evidence of previa or  accreta (history of 3 previous cesarean deliveries). ---------------------------------------------------------------------- Recommendations  -An appointment  was made for her to return in 4 weeks for  fetal growth assessment. ----------------------------------------------------------------------                  Noralee Space, MD Electronically Signed  Final Report   08/16/2019 12:08 pm ----------------------------------------------------------------------   Assessment and Plan:  Pregnancy: [id number] at [redacted]w[redacted]d 1. Encounter for supervision of high risk multigravida of advanced maternal age, antepartum Good FM  2. Chronic hypertension during pregnancy Not taking ASA. She only has the chewable   3. Antepartum multigravida of advanced maternal age  78. Previous cesarean delivery, antepartum Pt is scheduled for a repeat on 10/29/2018  5. Suboxone maintenance treatment complicating pregnancy, antepartum (HCC) Suboxone  bid  Need NICU consult preop o aPreterm labor symptoms and general obstetric precautions including but not limited to vaginal bleeding, contractions, leaking of fluid and fetal movement were reviewed in detail with the patient. I discussed the assessment and treatment plan with the patient. The patient was provided an opportunity to ask questions and all were answered. The patient agreed with the plan and demonstrated an understanding of the instructions. The patient was advised to call back or seek an in-person office evaluation/go to [facility] at [facility] for any urgent or concerning symptoms. Please refer to After Visit Summary for other counseling recommendations.   I provided 14 minutes of face-to-face time during this encounter.  No follow-ups on file.  Future Appointments  Date Time Provider Department Center  09/14/2019 12:45 PM WH-MFC [place] 2 WH-MFCUS MFC-US  09/14/2019 12:50 PM WH-MFC NURSE WH-MFC MFC-US    Willodean Rosenthal, MD Center for [facility], [facility]

## 2019-09-14 ENCOUNTER — Ambulatory Visit (HOSPITAL_COMMUNITY): Payer: Medicaid Other | Admitting: *Deleted

## 2019-09-14 ENCOUNTER — Ambulatory Visit (HOSPITAL_COMMUNITY)
Admission: RE | Admit: 2019-09-14 | Discharge: 2019-09-14 | Disposition: A | Discharge status: Home / Self Care | Payer: Medicaid Other | Source: Ambulatory Visit | Attending: Obstetrics and Gynecology | Admitting: Obstetrics and Gynecology

## 2019-09-14 ENCOUNTER — Other Ambulatory Visit (HOSPITAL_COMMUNITY): Payer: Self-pay | Admitting: *Deleted

## 2019-09-14 ENCOUNTER — Other Ambulatory Visit: Payer: Self-pay

## 2019-09-14 ENCOUNTER — Encounter (HOSPITAL_COMMUNITY): Payer: Self-pay

## 2019-09-14 ENCOUNTER — Telehealth: Payer: Self-pay

## 2019-09-14 VITALS — BP 127/75 | HR 83 | Temp 97.6°F

## 2019-09-14 DIAGNOSIS — O09523 Supervision of elderly multigravida, third trimester: Secondary | ICD-10-CM | POA: Insufficient documentation

## 2019-09-14 DIAGNOSIS — O99323 Drug use complicating pregnancy, third trimester: Secondary | ICD-10-CM | POA: Diagnosis not present

## 2019-09-14 DIAGNOSIS — O10013 Pre-existing essential hypertension complicating pregnancy, third trimester: Secondary | ICD-10-CM

## 2019-09-14 DIAGNOSIS — O99320 Drug use complicating pregnancy, unspecified trimester: Secondary | ICD-10-CM | POA: Diagnosis present

## 2019-09-14 DIAGNOSIS — F1120 Opioid dependence, uncomplicated: Secondary | ICD-10-CM | POA: Insufficient documentation

## 2019-09-14 DIAGNOSIS — O99333 Smoking (tobacco) complicating pregnancy, third trimester: Secondary | ICD-10-CM

## 2019-09-14 DIAGNOSIS — O10919 Unspecified pre-existing hypertension complicating pregnancy, unspecified trimester: Secondary | ICD-10-CM

## 2019-09-14 DIAGNOSIS — Z3A32 32 weeks gestation of pregnancy: Secondary | ICD-10-CM

## 2019-09-14 DIAGNOSIS — O34219 Maternal care for unspecified type scar from previous cesarean delivery: Secondary | ICD-10-CM

## 2019-09-14 DIAGNOSIS — Z362 Encounter for other antenatal screening follow-up: Secondary | ICD-10-CM | POA: Diagnosis not present

## 2019-09-14 NOTE — Telephone Encounter (Signed)
Patient calling regarding hernia  States pain in 6/10x and was told to contact our office by provider if she had discomfort. Pt wants to know can imaging studies be done to look at the area etc.   Please advise.

## 2019-09-18 ENCOUNTER — Other Ambulatory Visit (HOSPITAL_COMMUNITY): Payer: Self-pay | Admitting: *Deleted

## 2019-09-18 DIAGNOSIS — O10913 Unspecified pre-existing hypertension complicating pregnancy, third trimester: Secondary | ICD-10-CM

## 2019-09-20 ENCOUNTER — Encounter: Payer: Medicaid Other | Admitting: Obstetrics & Gynecology

## 2019-09-20 ENCOUNTER — Ambulatory Visit (HOSPITAL_COMMUNITY)
Admission: RE | Admit: 2019-09-20 | Discharge: 2019-09-20 | Disposition: A | Discharge status: Home / Self Care | Payer: Medicaid Other | Source: Ambulatory Visit | Attending: Obstetrics | Admitting: Obstetrics

## 2019-09-20 ENCOUNTER — Encounter (HOSPITAL_COMMUNITY): Payer: Self-pay | Admitting: *Deleted

## 2019-09-20 ENCOUNTER — Ambulatory Visit (HOSPITAL_COMMUNITY): Payer: Medicaid Other | Admitting: *Deleted

## 2019-09-20 ENCOUNTER — Other Ambulatory Visit: Payer: Self-pay

## 2019-09-20 VITALS — BP 126/70 | HR 89 | Temp 97.8°F

## 2019-09-20 DIAGNOSIS — O99333 Smoking (tobacco) complicating pregnancy, third trimester: Secondary | ICD-10-CM

## 2019-09-20 DIAGNOSIS — Z3A33 33 weeks gestation of pregnancy: Secondary | ICD-10-CM | POA: Diagnosis not present

## 2019-09-20 DIAGNOSIS — I10 Essential (primary) hypertension: Secondary | ICD-10-CM | POA: Diagnosis not present

## 2019-09-20 DIAGNOSIS — O34219 Maternal care for unspecified type scar from previous cesarean delivery: Secondary | ICD-10-CM | POA: Diagnosis not present

## 2019-09-20 DIAGNOSIS — O09523 Supervision of elderly multigravida, third trimester: Secondary | ICD-10-CM

## 2019-09-20 DIAGNOSIS — O99323 Drug use complicating pregnancy, third trimester: Secondary | ICD-10-CM

## 2019-09-20 DIAGNOSIS — O10919 Unspecified pre-existing hypertension complicating pregnancy, unspecified trimester: Secondary | ICD-10-CM | POA: Insufficient documentation

## 2019-09-20 DIAGNOSIS — O10013 Pre-existing essential hypertension complicating pregnancy, third trimester: Secondary | ICD-10-CM

## 2019-09-20 DIAGNOSIS — O99213 Obesity complicating pregnancy, third trimester: Secondary | ICD-10-CM | POA: Diagnosis not present

## 2019-09-25 ENCOUNTER — Encounter: Payer: Medicaid Other | Admitting: Obstetrics and Gynecology

## 2019-09-25 DIAGNOSIS — Z5181 Encounter for therapeutic drug level monitoring: Secondary | ICD-10-CM | POA: Diagnosis not present

## 2019-09-27 DIAGNOSIS — F329 Major depressive disorder, single episode, unspecified: Secondary | ICD-10-CM | POA: Diagnosis not present

## 2019-09-28 ENCOUNTER — Ambulatory Visit (HOSPITAL_COMMUNITY): Payer: Medicaid Other

## 2019-09-28 ENCOUNTER — Other Ambulatory Visit: Payer: Self-pay

## 2019-09-28 ENCOUNTER — Ambulatory Visit (INDEPENDENT_AMBULATORY_CARE_PROVIDER_SITE_OTHER): Payer: Medicaid Other

## 2019-09-28 VITALS — BP 136/78 | HR 82 | Wt 296.0 lb

## 2019-09-28 DIAGNOSIS — O09523 Supervision of elderly multigravida, third trimester: Secondary | ICD-10-CM

## 2019-09-28 DIAGNOSIS — Z3A34 34 weeks gestation of pregnancy: Secondary | ICD-10-CM

## 2019-09-28 DIAGNOSIS — Z23 Encounter for immunization: Secondary | ICD-10-CM | POA: Diagnosis not present

## 2019-09-28 DIAGNOSIS — O10919 Unspecified pre-existing hypertension complicating pregnancy, unspecified trimester: Secondary | ICD-10-CM

## 2019-09-28 DIAGNOSIS — O09529 Supervision of elderly multigravida, unspecified trimester: Secondary | ICD-10-CM

## 2019-09-28 DIAGNOSIS — O26893 Other specified pregnancy related conditions, third trimester: Secondary | ICD-10-CM

## 2019-09-28 DIAGNOSIS — O26899 Other specified pregnancy related conditions, unspecified trimester: Secondary | ICD-10-CM

## 2019-09-28 DIAGNOSIS — R109 Unspecified abdominal pain: Secondary | ICD-10-CM

## 2019-09-28 DIAGNOSIS — O10913 Unspecified pre-existing hypertension complicating pregnancy, third trimester: Secondary | ICD-10-CM

## 2019-09-28 NOTE — Progress Notes (Signed)
   PRENATAL VISIT NOTE  Subjective:  Sydney Griffin is a 39 y.o. [phone] at [redacted]w[redacted]d who presents today for routine prenatal care.  She is currently being monitored for supervision of a high-risk pregnancy with problems as listed below.  Patient has no pregnancy related concerns and endorses fetal movement and contractions.  She denies vaginal concerns including discharge, bleeding, leaking, itching, and burning. She reports abdominal pain at the area of her known hernia.  She also reports fatigue.  She states she continues to take her Suboxone and Reglan as scheduled.  However, patient reports that her labetalol was discontinued "a while ago."  Patient Active Problem List   Diagnosis Date Noted  . AMA (advanced maternal age) multigravida 35+ 06/13/2019  . Encounter for supervision of high risk multigravida of advanced maternal age, antepartum 04/11/2019  . Chronic hypertension during pregnancy 04/11/2019  . Tobacco use complicating pregnancy 04/11/2019  . Suboxone maintenance treatment complicating pregnancy, antepartum (HCC) 04/11/2019  . Carpal tunnel syndrome 02/22/2013  . Tobacco abuse 02/15/2013  . Previous cesarean delivery, antepartum 03/27/2012  . Morbid obesity (HCC) 03/27/2012    The following portions of the patient's history were reviewed and updated as appropriate: allergies, current medications, past family history, past medical history, past social history, past surgical history and problem list. Problem list updated.  Objective:   Vitals:   09/28/19 1602  BP: 136/78  Pulse: 82  Weight: 296 lb (134.3 kg)    Fetal Status: Fetal Heart Rate (bpm): 138 Fundal Height: 35 cm Movement: Present     General:  Alert, oriented and cooperative. Patient is in no acute distress.  Skin: Skin is warm and dry.   Cardiovascular: Regular rate and rhythm.  Respiratory: Normal respiratory effort. CTA-Bilaterally  Abdomen: Soft, gravid, appropriate for gestational age. Hernia  noted-mildly tender to touch, but reducible without issues.   Pelvic: Cervical exam deferred        Extremities: Normal range of motion.  Edema: Trace  Mental Status: Normal mood and affect. Normal behavior. Normal judgment and thought content.   Assessment and Plan:  Pregnancy: [id number] at [redacted]w[redacted]d  1. Encounter for supervision of high risk multigravida of advanced maternal age, antepartum -Anticipatory guidance for upcoming appts. -Will schedule for HR-OB in 2 weeks via virtual visit as GBS not needed d/t Repeat C/S status.   2. Chronic hypertension during pregnancy -Reports not taking Labetalol as it was discontinued in early pregnancy. -BP stable today. -Informed that will continue to monitor as appropriate.  3. Abdominal pain affecting pregnancy -Discussed methods for reducing discomfort associated with hernia. -Advised tylenol dosing and warm/cold compresses as desired. -Discouraged usage of heating pad on abdominal area for extended periods of time. -Informed that general surgery consult would be necessary for repair, but that usually does not occur until after delivery. -Patient encouraged to discuss options with MD at next visit.   Preterm labor symptoms and general obstetric precautions including but not limited to vaginal bleeding, contractions, leaking of fluid and fetal movement were reviewed with the patient.  Please refer to After Visit Summary for other counseling recommendations.  No follow-ups on file.  Future Appointments  Date Time Provider Department Center  10/12/2019 12:40 PM WH-MFC NURSE WH-MFC MFC-US  10/12/2019 12:45 PM WH-MFC [place] 2 WH-MFCUS MFC-US    Cherre Robins, CNM 09/28/2019, 4:12 PM

## 2019-09-28 NOTE — Patient Instructions (Signed)
Hernia, Adult     A hernia is the bulging of an organ or tissue through a weak spot in the muscles of the abdomen (abdominal wall). Hernias develop most often near the belly button (navel) or the area where the leg meets the lower abdomen (groin). Common types of hernias include:  Incisional hernia. This type bulges through a scar from an abdominal surgery.  Umbilical hernia. This type develops near the navel.  Inguinal hernia. This type develops in the groin or scrotum.  Femoral hernia. This type develops under the groin, in the upper thigh area.  Hiatal hernia. This type occurs when part of the stomach slides above the muscle that separates the abdomen from the chest (diaphragm). What are the causes? This condition may be caused by:  Heavy lifting.  Coughing over a long period of time.  Straining to have a bowel movement. Constipation can lead to straining.  An incision made during an abdominal surgery.  A physical problem that is present at birth (congenital defect).  Being overweight or obese.  Smoking.  Excess fluid in the abdomen.  Undescended testicles in males. What are the signs or symptoms? The main symptom is a skin-colored, rounded bulge in the area of the hernia. However, a bulge may not always be present. It may grow bigger or be more visible when you cough or strain (such as when lifting something heavy). A hernia that can be pushed back into the area (is reducible) rarely causes pain. A hernia that cannot be pushed back into the area (is incarcerated) may lose its blood supply (become strangulated). A hernia that is incarcerated may cause:  Pain.  Fever.  Nausea and vomiting.  Swelling.  Constipation. How is this diagnosed? A hernia may be diagnosed based on:  Your symptoms and medical history.  A physical exam. Your health care provider may ask you to cough or move in certain ways to see if the hernia becomes visible.  Imaging tests, such  as: ? X-rays. ? Ultrasound. ? CT scan. How is this treated? A hernia that is small and painless may not need to be treated. A hernia that is large or painful may be treated with surgery. Inguinal hernias may be treated with surgery to prevent incarceration or strangulation. Strangulated hernias are always treated with surgery because a lack of blood supply to the trapped organ or tissue can cause it to die. Surgery to treat a hernia involves pushing the bulge back into place and repairing the weak area of the muscle or abdominal wall. Follow these instructions at home: Activity  Avoid straining.  Do not lift anything that is heavier than 10 lb (4.5 kg), or the limit that you are told, until your health care provider says that it is safe.  When lifting heavy objects, lift with your leg muscles, not your back muscles. Preventing constipation  Take actions to prevent constipation. Constipation leads to straining with bowel movements, which can make a hernia worse or cause a hernia repair to break down. Your health care provider may recommend that you: ? Drink enough fluid to keep your urine pale yellow. ? Eat foods that are high in fiber, such as fresh fruits and vegetables, whole grains, and beans. ? Limit foods that are high in fat and processed sugars, such as fried or sweet foods. ? Take an over-the-counter or prescription medicine for constipation. General instructions  When coughing, try to cough gently.  You may try to push the hernia back in place   by very gently pressing on it while lying down. Do not try to force the bulge back in if it will not push in easily.  If you are overweight, work with your health care provider to lose weight safely.  Do not use any products that contain nicotine or tobacco, such as cigarettes and e-cigarettes. If you need help quitting, ask your health care provider.  If you are scheduled for hernia repair, watch your hernia for any changes in shape,  size, or color. Tell your health care provider about any changes or new symptoms.  Take over-the-counter and prescription medicines only as told by your health care provider.  Keep all follow-up visits as told by your health care provider. This is important. Contact a health care provider if:  You develop new pain, swelling, or redness around your hernia.  You have signs of constipation, such as: ? Fewer bowel movements in a week than normal. ? Difficulty having a bowel movement. ? Stools that are dry, hard, or larger than normal. Get help right away if:  You have a fever.  You have abdomen pain that gets worse.  You feel nauseous or you vomit.  You cannot push the hernia back in place by very gently pressing on it while lying down. Do not try to force the bulge back in if it will not push in easily.  The hernia: ? Changes in shape, size, or color. ? Feels hard or tender. These symptoms may represent a serious problem that is an emergency. Do not wait to see if the symptoms will go away. Get medical help right away. Call your local emergency services (911 in the [place] Summary  A hernia is the bulging of an organ or tissue through a weak spot in the muscles of the abdomen (abdominal wall).  The main symptom is a skin-colored, rounded lump (bulge) in the hernia area. However, a bulge may not always be present. It may grow bigger or more visible when you cough or strain (such as when having a bowel movement).  A hernia that is small and painless may not need to be treated. A hernia that is large or painful may be treated with surgery.  Surgery to treat a hernia involves pushing the bulge back into place and repairing the weak part of the abdomen. This information is not intended to replace advice given to you by your health care provider. Make sure you discuss any questions you have with your health care provider. Document Revised: 12/15/2018 Document Reviewed: 05/26/2017 Elsevier  Patient Education  2020 [employer]  

## 2019-09-28 NOTE — Progress Notes (Signed)
HOB.  C/o hernia pain 10/10 x 2 weeks, fatigue.  FLU vaccine given in LD, tolerated well.

## 2019-10-05 ENCOUNTER — Ambulatory Visit (HOSPITAL_COMMUNITY): Payer: Medicaid Other

## 2019-10-09 ENCOUNTER — Telehealth: Payer: Self-pay | Admitting: Pediatrics

## 2019-10-09 ENCOUNTER — Encounter (HOSPITAL_COMMUNITY): Payer: Self-pay

## 2019-10-12 ENCOUNTER — Ambulatory Visit (HOSPITAL_COMMUNITY): Payer: Medicaid Other | Admitting: *Deleted

## 2019-10-12 ENCOUNTER — Other Ambulatory Visit: Payer: Self-pay

## 2019-10-12 ENCOUNTER — Encounter (HOSPITAL_COMMUNITY): Payer: Self-pay

## 2019-10-12 ENCOUNTER — Ambulatory Visit (HOSPITAL_COMMUNITY)
Admission: RE | Admit: 2019-10-12 | Discharge: 2019-10-12 | Disposition: A | Discharge status: Home / Self Care | Payer: Medicaid Other | Source: Ambulatory Visit | Attending: Obstetrics and Gynecology | Admitting: Obstetrics and Gynecology

## 2019-10-12 ENCOUNTER — Telehealth (INDEPENDENT_AMBULATORY_CARE_PROVIDER_SITE_OTHER): Payer: Medicaid Other | Admitting: Obstetrics & Gynecology

## 2019-10-12 VITALS — BP 134/76 | HR 97 | Temp 97.2°F

## 2019-10-12 VITALS — BP 136/80

## 2019-10-12 DIAGNOSIS — F1120 Opioid dependence, uncomplicated: Secondary | ICD-10-CM | POA: Insufficient documentation

## 2019-10-12 DIAGNOSIS — O10913 Unspecified pre-existing hypertension complicating pregnancy, third trimester: Secondary | ICD-10-CM | POA: Diagnosis not present

## 2019-10-12 DIAGNOSIS — O34219 Maternal care for unspecified type scar from previous cesarean delivery: Secondary | ICD-10-CM | POA: Diagnosis not present

## 2019-10-12 DIAGNOSIS — O09523 Supervision of elderly multigravida, third trimester: Secondary | ICD-10-CM | POA: Diagnosis not present

## 2019-10-12 DIAGNOSIS — O10919 Unspecified pre-existing hypertension complicating pregnancy, unspecified trimester: Secondary | ICD-10-CM | POA: Diagnosis not present

## 2019-10-12 DIAGNOSIS — O10013 Pre-existing essential hypertension complicating pregnancy, third trimester: Secondary | ICD-10-CM

## 2019-10-12 DIAGNOSIS — O99323 Drug use complicating pregnancy, third trimester: Secondary | ICD-10-CM | POA: Diagnosis not present

## 2019-10-12 DIAGNOSIS — O99213 Obesity complicating pregnancy, third trimester: Secondary | ICD-10-CM | POA: Diagnosis not present

## 2019-10-12 DIAGNOSIS — Z362 Encounter for other antenatal screening follow-up: Secondary | ICD-10-CM

## 2019-10-12 DIAGNOSIS — Z3A36 36 weeks gestation of pregnancy: Secondary | ICD-10-CM | POA: Diagnosis not present

## 2019-10-12 DIAGNOSIS — O09529 Supervision of elderly multigravida, unspecified trimester: Secondary | ICD-10-CM

## 2019-10-12 DIAGNOSIS — O99320 Drug use complicating pregnancy, unspecified trimester: Secondary | ICD-10-CM | POA: Diagnosis not present

## 2019-10-12 NOTE — Patient Instructions (Signed)
Cesarean Delivery Cesarean birth, or cesarean delivery, is the surgical delivery of a baby through an incision in the abdomen and the uterus. This may be referred to as a C-section. This procedure may be scheduled ahead of time, or it may be done in an emergency situation. Tell a health care provider about:  Any allergies you have.  All medicines you are taking, including vitamins, herbs, eye drops, creams, and over-the-counter medicines.  Any problems you or family members have had with anesthetic medicines.  Any blood disorders you have.  Any surgeries you have had.  Any medical conditions you have.  Whether you or any members of your family have a history of deep vein thrombosis (DVT) or pulmonary embolism (PE). What are the risks? Generally, this is a safe procedure. However, problems may occur, including:  Infection.  Bleeding.  Allergic reactions to medicines.  Damage to other structures or organs.  Blood clots.  Injury to your baby. What happens before the procedure? General instructions  Follow instructions from your health care provider about eating or drinking restrictions.  If you know that you are going to have a cesarean delivery, do not shave your pubic area. Shaving before the procedure may increase your risk of infection.  Plan to have someone take you home from the hospital.  Ask your health care provider what steps will be taken to prevent infection. These may include: ? Removing hair at the surgery site. ? Washing skin with a germ-killing soap. ? Taking antibiotic medicine.  Depending on the reason for your cesarean delivery, you may have a physical exam or additional testing, such as an ultrasound.  You may have your blood or urine tested. Questions for your health care provider  Ask your health care provider about: ? Changing or stopping your regular medicines. This is especially important if you are taking diabetes medicines or blood  thinners. ? Your pain management plan. This is especially important if you plan to breastfeed your baby. ? How long you will be in the hospital after the procedure. ? Any concerns you may have about receiving blood products, if you need them during the procedure. ? Cord blood banking, if you plan to collect your baby's umbilical cord blood.  You may also want to ask your health care provider: ? Whether you will be able to hold or breastfeed your baby while you are still in the operating room. ? Whether your baby can stay with you immediately after the procedure and during your recovery. ? Whether a family member or a person of your choice can go with you into the operating room and stay with you during the procedure, immediately after the procedure, and during your recovery. What happens during the procedure?   An IV will be inserted into one of your veins.  Fluid and medicines, such as antibiotics, will be given before the surgery.  Fetal monitors will be placed on your abdomen to check your baby's heart rate.  You may be given a special warming gown to wear to keep your temperature stable.  A catheter may be inserted into your bladder through your urethra. This drains your urine during the procedure.  You may be given one or more of the following: ? A medicine to numb the area (local anesthetic). ? A medicine to make you fall asleep (general anesthetic). ? A medicine (regional anesthetic) that is injected into your back or through a small thin tube placed in your back (spinal anesthetic or epidural anesthetic).   This numbs everything below the injection site and allows you to stay awake during your procedure. If this makes you feel nauseous, tell your health care provider. Medicines will be available to help reduce any nausea you may feel.  An incision will be made in your abdomen, and then in your uterus.  If you are awake during your procedure, you may feel tugging and pulling in  your abdomen, but you should not feel pain. If you feel pain, tell your health care provider immediately.  Your baby will be removed from your uterus. You may feel more pressure or pushing while this happens.  Immediately after birth, your baby will be dried and kept warm. You may be able to hold and breastfeed your baby.  The umbilical cord may be clamped and cut during this time. This usually occurs after waiting a period of 1-2 minutes after delivery.  Your placenta will be removed from your uterus.  Your incisions will be closed with stitches (sutures). Staples, skin glue, or adhesive strips may also be applied to the incision in your abdomen.  Bandages (dressings) may be placed over the incision in your abdomen. The procedure may vary among health care providers and hospitals. What happens after the procedure?  Your blood pressure, heart rate, breathing rate, and blood oxygen level will be monitored until you are discharged from the hospital.  You may continue to receive fluids and medicines through an IV.  You will have some pain. Medicines will be available to help control your pain.  To help prevent blood clots: ? You may be given medicines. ? You may have to wear compression stockings or devices. ? You will be encouraged to walk around when you are able.  Hospital staff will encourage and support bonding with your baby. Your hospital may have you and your baby to stay in the same room (rooming in) during your hospital stay to encourage successful bonding and breastfeeding.  You may be encouraged to cough and breathe deeply often. This helps to prevent lung problems.  If you have a catheter draining your urine, it will be removed as soon as possible after your procedure. Summary  Cesarean birth, or cesarean delivery, is the surgical delivery of a baby through an incision in the abdomen and the uterus.  Follow instructions from your health care provider about eating or  drinking restrictions before the procedure.  You will have some pain after the procedure. Medicines will be available to help control your pain.  Hospital staff will encourage and support bonding with your baby after the procedure. Your hospital may have you and your baby to stay in the same room (rooming in) during your hospital stay to encourage successful bonding and breastfeeding. This information is not intended to replace advice given to you by your health care provider. Make sure you discuss any questions you have with your health care provider. Document Revised: 02/28/2018 Document Reviewed: 02/28/2018 Elsevier Patient Education  2020 [employer]  

## 2019-10-12 NOTE — Progress Notes (Signed)
   TELEHEALTH VIRTUAL OBSTETRICS VISIT ENCOUNTER NOTE  I connected with Sydney Griffin on 10/12/19 at  4:00 PM EST by telephone at home and verified that I am speaking with the correct person using two identifiers.   I discussed the limitations, risks, security and privacy concerns of performing an evaluation and management service by telephone and the availability of in person appointments. I also discussed with the patient that there may be a patient responsible charge related to this service. The patient expressed understanding and agreed to proceed.  Subjective:  Sydney Griffin is a 39 y.o. [phone] at [redacted]w[redacted]d being followed for ongoing prenatal care.  She is currently monitored for the following issues for this high-risk pregnancy and has Previous cesarean delivery, antepartum; Morbid obesity (HCC); Tobacco abuse; Carpal tunnel syndrome; Encounter for supervision of high risk multigravida of advanced maternal age, antepartum; Chronic hypertension during pregnancy; Tobacco use complicating pregnancy; Suboxone maintenance treatment complicating pregnancy, antepartum (HCC); and AMA (advanced maternal age) multigravida 35+ on their problem list.  Patient reports no complaints. Reports fetal movement. Denies any contractions, bleeding or leaking of fluid.   The following portions of the patient's history were reviewed and updated as appropriate: allergies, current medications, past family history, past medical history, past social history, past surgical history and problem list.   Objective:   General:  Alert, oriented and cooperative.   Mental Status: Normal mood and affect perceived. Normal judgment and thought content.  Rest of physical exam deferred due to type of encounter  Assessment and Plan:  Pregnancy: [id number] at [redacted]w[redacted]d 1. Antepartum multigravida of advanced maternal age  76. Encounter for supervision of high risk multigravida of advanced maternal age, antepartum Needs GBS next  week 3. Chronic hypertension during pregnancy BP stable on no meds  4. Previous cesarean delivery, antepartum Repeat scheduled 39 weeks  5. Suboxone maintenance treatment complicating pregnancy, antepartum (HCC)   Preterm labor symptoms and general obstetric precautions including but not limited to vaginal bleeding, contractions, leaking of fluid and fetal movement were reviewed in detail with the patient.  I discussed the assessment and treatment plan with the patient. The patient was provided an opportunity to ask questions and all were answered. The patient agreed with the plan and demonstrated an understanding of the instructions. The patient was advised to call back or seek an in-person office evaluation/go to MAU at [facility] for any urgent or concerning symptoms. Please refer to After Visit Summary for other counseling recommendations.   I provided 12 minutes of non-face-to-face time during this encounter.  Return in about 1 week (around 10/19/2019) for in person for GBS.  No future appointments.  Scheryl Darter, MD Center for [facility], [facility]

## 2019-10-16 ENCOUNTER — Telehealth: Payer: Self-pay | Admitting: *Deleted

## 2019-10-16 DIAGNOSIS — Z5181 Encounter for therapeutic drug level monitoring: Secondary | ICD-10-CM | POA: Diagnosis not present

## 2019-10-16 NOTE — Telephone Encounter (Signed)
Pt called to office to inquire about breast pump and order. Pt advise to go on [employer] site and complete information and then they will send [place] a form to complete on her behalf.  Advised to call if she has any problems with the process.

## 2019-10-17 NOTE — Patient Instructions (Signed)
Sydney Griffin  10/17/2019   Your procedure is scheduled on:  10/30/2019  Arrive at 0745 at Entrance C on [address] at [facility]  and [employer]. You are invited to use the FREE valet parking or use the Visitor's parking deck.  Pick up the phone at the desk and dial [phone].  Call this number if you have problems the morning of surgery: [phone]  Remember:   Do not eat food:(After Midnight) Desps de medianoche.  Do not drink clear liquids: (After Midnight) Desps de medianoche.  Take these medicines the morning of surgery with A SIP OF WATER:  Take suboxone as prescribed   Do not wear jewelry, make-up or nail polish.  Do not wear lotions, powders, or perfumes. Do not wear deodorant.  Do not shave 48 hours prior to surgery.  Do not bring valuables to the hospital.  [facility] is not   responsible for any belongings or valuables brought to the hospital.  Contacts, dentures or bridgework may not be worn into surgery.  Leave suitcase in the car. After surgery it may be brought to your room.  For patients admitted to the hospital, checkout time is 11:00 AM the day of              discharge.      Please read over the following fact sheets that you were given:     Preparing for Surgery

## 2019-10-18 ENCOUNTER — Encounter (HOSPITAL_COMMUNITY): Payer: Self-pay

## 2019-10-18 ENCOUNTER — Encounter: Payer: Medicaid Other | Admitting: Obstetrics & Gynecology

## 2019-10-25 ENCOUNTER — Other Ambulatory Visit (HOSPITAL_COMMUNITY)
Admission: RE | Admit: 2019-10-25 | Discharge: 2019-10-25 | Disposition: A | Discharge status: Home / Self Care | Payer: Medicaid Other | Source: Ambulatory Visit | Attending: Obstetrics & Gynecology | Admitting: Obstetrics & Gynecology

## 2019-10-25 ENCOUNTER — Ambulatory Visit (INDEPENDENT_AMBULATORY_CARE_PROVIDER_SITE_OTHER): Payer: Medicaid Other | Admitting: Obstetrics & Gynecology

## 2019-10-25 ENCOUNTER — Other Ambulatory Visit: Payer: Self-pay

## 2019-10-25 VITALS — BP 119/77 | HR 90 | Wt 292.0 lb

## 2019-10-25 DIAGNOSIS — O09529 Supervision of elderly multigravida, unspecified trimester: Secondary | ICD-10-CM

## 2019-10-25 DIAGNOSIS — O10919 Unspecified pre-existing hypertension complicating pregnancy, unspecified trimester: Secondary | ICD-10-CM

## 2019-10-25 DIAGNOSIS — O09523 Supervision of elderly multigravida, third trimester: Secondary | ICD-10-CM

## 2019-10-25 DIAGNOSIS — O99323 Drug use complicating pregnancy, third trimester: Secondary | ICD-10-CM

## 2019-10-25 DIAGNOSIS — O34219 Maternal care for unspecified type scar from previous cesarean delivery: Secondary | ICD-10-CM

## 2019-10-25 DIAGNOSIS — Z3A38 38 weeks gestation of pregnancy: Secondary | ICD-10-CM

## 2019-10-25 DIAGNOSIS — O10913 Unspecified pre-existing hypertension complicating pregnancy, third trimester: Secondary | ICD-10-CM

## 2019-10-25 DIAGNOSIS — F1120 Opioid dependence, uncomplicated: Secondary | ICD-10-CM

## 2019-10-25 NOTE — Progress Notes (Signed)
   PRENATAL VISIT NOTE  Subjective:  Sydney Griffin is a 39 y.o. [phone] at [redacted]w[redacted]d being seen today for ongoing prenatal care.  She is currently monitored for the following issues for this high-risk pregnancy and has Previous cesarean delivery, antepartum; Morbid obesity (HCC); Tobacco abuse; Carpal tunnel syndrome; Encounter for supervision of high risk multigravida of advanced maternal age, antepartum; Chronic hypertension during pregnancy; Tobacco use complicating pregnancy; Suboxone maintenance treatment complicating pregnancy, antepartum (HCC); and AMA (advanced maternal age) multigravida 35+ on their problem list.  Patient reports no complaints.  Contractions: Irritability. Vag. Bleeding: None.  Movement: Present. Denies leaking of fluid.   The following portions of the patient's history were reviewed and updated as appropriate: allergies, current medications, past family history, past medical history, past social history, past surgical history and problem list.   Objective:   Vitals:   10/25/19 1628  BP: 119/77  Pulse: 90  Weight: 292 lb (132.5 kg)    Fetal Status: Fetal Heart Rate (bpm): 138 Fundal Height: 38 cm Movement: Present  Presentation: Vertex  General:  Alert, oriented and cooperative. Patient is in no acute distress.  Skin: Skin is warm and dry. No rash noted.   Cardiovascular: Normal heart rate noted  Respiratory: Normal respiratory effort, no problems with respiration noted  Abdomen: Soft, gravid, appropriate for gestational age.  Pain/Pressure: Present     Pelvic: Cervical exam performed Dilation: Fingertip Effacement (%): 30 Station: Ballotable  Extremities: Normal range of motion.  Edema: Trace  Mental Status: Normal mood and affect. Normal behavior. Normal judgment and thought content.   Assessment and Plan:  Pregnancy: [id number] at [redacted]w[redacted]d 1. Encounter for supervision of high risk multigravida of advanced maternal age, antepartum Routine testing - Strep Gp B  NAA - Cervicovaginal ancillary only( [place])  2. Previous cesarean delivery, antepartum Repeat 39 weeks  3. Chronic hypertension during pregnancy   4. Suboxone maintenance treatment complicating pregnancy, antepartum (HCC) Continue present  Term labor symptoms and general obstetric precautions including but not limited to vaginal bleeding, contractions, leaking of fluid and fetal movement were reviewed in detail with the patient. Please refer to After Visit Summary for other counseling recommendations.   Return if symptoms worsen or fail to improve, for postop.  Future Appointments  Date Time Provider Department Center  10/28/2019  8:30 AM [facility] 1 [facility] None    Scheryl Darter, MD

## 2019-10-25 NOTE — Patient Instructions (Signed)
Cesarean Delivery Cesarean birth, or cesarean delivery, is the surgical delivery of a baby through an incision in the abdomen and the uterus. This may be referred to as a C-section. This procedure may be scheduled ahead of time, or it may be done in an emergency situation. Tell a health care provider about:  Any allergies you have.  All medicines you are taking, including vitamins, herbs, eye drops, creams, and over-the-counter medicines.  Any problems you or family members have had with anesthetic medicines.  Any blood disorders you have.  Any surgeries you have had.  Any medical conditions you have.  Whether you or any members of your family have a history of deep vein thrombosis (DVT) or pulmonary embolism (PE). What are the risks? Generally, this is a safe procedure. However, problems may occur, including:  Infection.  Bleeding.  Allergic reactions to medicines.  Damage to other structures or organs.  Blood clots.  Injury to your baby. What happens before the procedure? General instructions  Follow instructions from your health care provider about eating or drinking restrictions.  If you know that you are going to have a cesarean delivery, do not shave your pubic area. Shaving before the procedure may increase your risk of infection.  Plan to have someone take you home from the hospital.  Ask your health care provider what steps will be taken to prevent infection. These may include: ? Removing hair at the surgery site. ? Washing skin with a germ-killing soap. ? Taking antibiotic medicine.  Depending on the reason for your cesarean delivery, you may have a physical exam or additional testing, such as an ultrasound.  You may have your blood or urine tested. Questions for your health care provider  Ask your health care provider about: ? Changing or stopping your regular medicines. This is especially important if you are taking diabetes medicines or blood  thinners. ? Your pain management plan. This is especially important if you plan to breastfeed your baby. ? How long you will be in the hospital after the procedure. ? Any concerns you may have about receiving blood products, if you need them during the procedure. ? Cord blood banking, if you plan to collect your baby's umbilical cord blood.  You may also want to ask your health care provider: ? Whether you will be able to hold or breastfeed your baby while you are still in the operating room. ? Whether your baby can stay with you immediately after the procedure and during your recovery. ? Whether a family member or a person of your choice can go with you into the operating room and stay with you during the procedure, immediately after the procedure, and during your recovery. What happens during the procedure?   An IV will be inserted into one of your veins.  Fluid and medicines, such as antibiotics, will be given before the surgery.  Fetal monitors will be placed on your abdomen to check your baby's heart rate.  You may be given a special warming gown to wear to keep your temperature stable.  A catheter may be inserted into your bladder through your urethra. This drains your urine during the procedure.  You may be given one or more of the following: ? A medicine to numb the area (local anesthetic). ? A medicine to make you fall asleep (general anesthetic). ? A medicine (regional anesthetic) that is injected into your back or through a small thin tube placed in your back (spinal anesthetic or epidural anesthetic).   This numbs everything below the injection site and allows you to stay awake during your procedure. If this makes you feel nauseous, tell your health care provider. Medicines will be available to help reduce any nausea you may feel.  An incision will be made in your abdomen, and then in your uterus.  If you are awake during your procedure, you may feel tugging and pulling in  your abdomen, but you should not feel pain. If you feel pain, tell your health care provider immediately.  Your baby will be removed from your uterus. You may feel more pressure or pushing while this happens.  Immediately after birth, your baby will be dried and kept warm. You may be able to hold and breastfeed your baby.  The umbilical cord may be clamped and cut during this time. This usually occurs after waiting a period of 1-2 minutes after delivery.  Your placenta will be removed from your uterus.  Your incisions will be closed with stitches (sutures). Staples, skin glue, or adhesive strips may also be applied to the incision in your abdomen.  Bandages (dressings) may be placed over the incision in your abdomen. The procedure may vary among health care providers and hospitals. What happens after the procedure?  Your blood pressure, heart rate, breathing rate, and blood oxygen level will be monitored until you are discharged from the hospital.  You may continue to receive fluids and medicines through an IV.  You will have some pain. Medicines will be available to help control your pain.  To help prevent blood clots: ? You may be given medicines. ? You may have to wear compression stockings or devices. ? You will be encouraged to walk around when you are able.  Hospital staff will encourage and support bonding with your baby. Your hospital may have you and your baby to stay in the same room (rooming in) during your hospital stay to encourage successful bonding and breastfeeding.  You may be encouraged to cough and breathe deeply often. This helps to prevent lung problems.  If you have a catheter draining your urine, it will be removed as soon as possible after your procedure. Summary  Cesarean birth, or cesarean delivery, is the surgical delivery of a baby through an incision in the abdomen and the uterus.  Follow instructions from your health care provider about eating or  drinking restrictions before the procedure.  You will have some pain after the procedure. Medicines will be available to help control your pain.  Hospital staff will encourage and support bonding with your baby after the procedure. Your hospital may have you and your baby to stay in the same room (rooming in) during your hospital stay to encourage successful bonding and breastfeeding. This information is not intended to replace advice given to you by your health care provider. Make sure you discuss any questions you have with your health care provider. Document Revised: 02/28/2018 Document Reviewed: 02/28/2018 Elsevier Patient Education  2020 [employer]  

## 2019-10-25 NOTE — Progress Notes (Signed)
ROB/GBS.  Reports no problems today.  CS scheduled for 10/30/2019

## 2019-10-26 LAB — CERVICOVAGINAL ANCILLARY ONLY
Chlamydia: NEGATIVE
Comment: NEGATIVE
Comment: NORMAL
Neisseria Gonorrhea: NEGATIVE

## 2019-10-27 LAB — STREP GP B NAA: Strep Gp B NAA: POSITIVE — AB

## 2019-10-28 ENCOUNTER — Other Ambulatory Visit: Payer: Self-pay

## 2019-10-28 ENCOUNTER — Other Ambulatory Visit (HOSPITAL_COMMUNITY)
Admission: RE | Admit: 2019-10-28 | Discharge: 2019-10-28 | Disposition: A | Discharge status: Home / Self Care | Payer: Medicaid Other | Source: Ambulatory Visit | Attending: Obstetrics and Gynecology | Admitting: Obstetrics and Gynecology

## 2019-10-28 DIAGNOSIS — Z20822 Contact with and (suspected) exposure to covid-19: Secondary | ICD-10-CM | POA: Diagnosis present

## 2019-10-28 LAB — CBC
HCT: 35.1 % — ABNORMAL LOW (ref 36.0–46.0)
Hemoglobin: 11.9 g/dL — ABNORMAL LOW (ref 12.0–15.0)
MCH: 32.3 pg (ref 26.0–34.0)
MCHC: 33.9 g/dL (ref 30.0–36.0)
MCV: 95.4 fL (ref 80.0–100.0)
Platelets: 196 10*3/uL (ref 150–400)
RBC: 3.68 MIL/uL — ABNORMAL LOW (ref 3.87–5.11)
RDW: 13.3 % (ref 11.5–15.5)
WBC: 13.2 10*3/uL — ABNORMAL HIGH (ref 4.0–10.5)
nRBC: 0 % (ref 0.0–0.2)

## 2019-10-28 LAB — TYPE AND SCREEN
ABO/RH(D): O POS
Antibody Screen: NEGATIVE

## 2019-10-28 LAB — SARS CORONAVIRUS 2 (TAT 6-24 HRS): SARS Coronavirus 2: NEGATIVE

## 2019-10-28 LAB — RPR: RPR Ser Ql: NONREACTIVE

## 2019-10-28 LAB — ABO/RH: ABO/RH(D): O POS

## 2019-10-28 NOTE — MAU Note (Signed)
Pt reports to mau for covid swab, and lab draw.  No symptoms reported.

## 2019-10-29 NOTE — Anesthesia Preprocedure Evaluation (Addendum)
Anesthesia Evaluation  Patient identified by MRN, date of birth, ID band Patient awake    Reviewed: Allergy & Precautions, NPO status , Patient's Chart, lab work & pertinent test results  History of Anesthesia Complications Negative for: history of anesthetic complications  Airway Mallampati: II  TM Distance: >3 FB Neck ROM: Full    Dental  (+) Missing,    Pulmonary asthma , Current Smoker,    Pulmonary exam normal        Cardiovascular hypertension, Normal cardiovascular exam     Neuro/Psych  Headaches, negative psych ROS   GI/Hepatic GERD  Controlled and Medicated,(+)     substance abuse (on 8-24mg  buprenorphine/day)  ,   Endo/Other  Morbid obesity  Renal/GU negative Renal ROS  negative genitourinary   Musculoskeletal negative musculoskeletal ROS (+) narcotic dependent  Abdominal   Peds  Hematology  (+) anemia , Hgb 11.9   Anesthesia Other Findings Day of surgery medications reviewed with patient.  Reproductive/Obstetrics (+) Pregnancy (Hx of C/S x3)                            Anesthesia Physical Anesthesia Plan  ASA: III  Anesthesia Plan: Combined Spinal and Epidural   Post-op Pain Management:    Induction:   PONV Risk Score and Plan: 3 and Treatment may vary due to age or medical condition, Ondansetron and Dexamethasone  Airway Management Planned: Natural Airway  Additional Equipment: None  Intra-op Plan:   Post-operative Plan:   Informed Consent: I have reviewed the patients History and Physical, chart, labs and discussed the procedure including the risks, benefits and alternatives for the proposed anesthesia with the patient or authorized representative who has indicated his/her understanding and acceptance.       Plan Discussed with: CRNA  Anesthesia Plan Comments: (Discussed listed dilaudid allergy. Pt states she received dilaudid in ED for HA many years ago  and is not sure of her reaction but did not have SOB, rash, or any other sx of anaphylaxis. Discussed that due to her suboxone use it may be of benefit to use dilaudid instead of morphine in her intrathecal medications due to better displacement of buprenorphine from mu receptors. She consents to use of dilaudid despite her listed allergy. Stephannie Peters, MD)       Anesthesia Quick Evaluation

## 2019-10-30 ENCOUNTER — Encounter (HOSPITAL_COMMUNITY): Payer: Self-pay | Admitting: Obstetrics and Gynecology

## 2019-10-30 ENCOUNTER — Inpatient Hospital Stay (HOSPITAL_COMMUNITY)
Admission: RE | Admit: 2019-10-30 | Discharge: 2019-11-01 | DRG: 787 | Disposition: A | Discharge status: Home / Self Care | Payer: Medicaid Other | Attending: Obstetrics and Gynecology | Admitting: Obstetrics and Gynecology

## 2019-10-30 ENCOUNTER — Inpatient Hospital Stay (HOSPITAL_COMMUNITY): Payer: Medicaid Other | Admitting: Anesthesiology

## 2019-10-30 ENCOUNTER — Other Ambulatory Visit: Payer: Self-pay

## 2019-10-30 ENCOUNTER — Encounter (HOSPITAL_COMMUNITY)
Admission: RE | Disposition: A | Discharge status: Home / Self Care | Payer: Self-pay | Source: Home / Self Care | Attending: Obstetrics and Gynecology

## 2019-10-30 DIAGNOSIS — Z30431 Encounter for routine checking of intrauterine contraceptive device: Secondary | ICD-10-CM | POA: Diagnosis not present

## 2019-10-30 DIAGNOSIS — O9902 Anemia complicating childbirth: Secondary | ICD-10-CM | POA: Diagnosis present

## 2019-10-30 DIAGNOSIS — O99824 Streptococcus B carrier state complicating childbirth: Secondary | ICD-10-CM | POA: Diagnosis present

## 2019-10-30 DIAGNOSIS — Z3A39 39 weeks gestation of pregnancy: Secondary | ICD-10-CM | POA: Diagnosis not present

## 2019-10-30 DIAGNOSIS — O99334 Smoking (tobacco) complicating childbirth: Secondary | ICD-10-CM | POA: Diagnosis present

## 2019-10-30 DIAGNOSIS — O1002 Pre-existing essential hypertension complicating childbirth: Secondary | ICD-10-CM | POA: Diagnosis present

## 2019-10-30 DIAGNOSIS — O99214 Obesity complicating childbirth: Secondary | ICD-10-CM | POA: Diagnosis not present

## 2019-10-30 DIAGNOSIS — O34211 Maternal care for low transverse scar from previous cesarean delivery: Secondary | ICD-10-CM | POA: Diagnosis not present

## 2019-10-30 DIAGNOSIS — F17210 Nicotine dependence, cigarettes, uncomplicated: Secondary | ICD-10-CM | POA: Diagnosis not present

## 2019-10-30 DIAGNOSIS — O26893 Other specified pregnancy related conditions, third trimester: Secondary | ICD-10-CM | POA: Diagnosis present

## 2019-10-30 DIAGNOSIS — D649 Anemia, unspecified: Secondary | ICD-10-CM | POA: Diagnosis present

## 2019-10-30 DIAGNOSIS — F1120 Opioid dependence, uncomplicated: Secondary | ICD-10-CM

## 2019-10-30 DIAGNOSIS — O99330 Smoking (tobacco) complicating pregnancy, unspecified trimester: Secondary | ICD-10-CM | POA: Diagnosis present

## 2019-10-30 DIAGNOSIS — Z30430 Encounter for insertion of intrauterine contraceptive device: Secondary | ICD-10-CM

## 2019-10-30 DIAGNOSIS — Z98891 History of uterine scar from previous surgery: Secondary | ICD-10-CM

## 2019-10-30 DIAGNOSIS — Z23 Encounter for immunization: Secondary | ICD-10-CM

## 2019-10-30 DIAGNOSIS — O10919 Unspecified pre-existing hypertension complicating pregnancy, unspecified trimester: Secondary | ICD-10-CM | POA: Diagnosis present

## 2019-10-30 DIAGNOSIS — Z20822 Contact with and (suspected) exposure to covid-19: Secondary | ICD-10-CM | POA: Diagnosis present

## 2019-10-30 DIAGNOSIS — Z975 Presence of (intrauterine) contraceptive device: Secondary | ICD-10-CM

## 2019-10-30 DIAGNOSIS — O09529 Supervision of elderly multigravida, unspecified trimester: Secondary | ICD-10-CM

## 2019-10-30 LAB — CBC
HCT: 32.7 % — ABNORMAL LOW (ref 36.0–46.0)
Hemoglobin: 10.8 g/dL — ABNORMAL LOW (ref 12.0–15.0)
MCH: 31.8 pg (ref 26.0–34.0)
MCHC: 33 g/dL (ref 30.0–36.0)
MCV: 96.2 fL (ref 80.0–100.0)
Platelets: 172 10*3/uL (ref 150–400)
RBC: 3.4 MIL/uL — ABNORMAL LOW (ref 3.87–5.11)
RDW: 13.4 % (ref 11.5–15.5)
WBC: 15.1 10*3/uL — ABNORMAL HIGH (ref 4.0–10.5)
nRBC: 0 % (ref 0.0–0.2)

## 2019-10-30 LAB — COMPREHENSIVE METABOLIC PANEL
ALT: 11 U/L (ref 0–44)
AST: 19 U/L (ref 15–41)
Albumin: 2.6 g/dL — ABNORMAL LOW (ref 3.5–5.0)
Alkaline Phosphatase: 148 U/L — ABNORMAL HIGH (ref 38–126)
Anion gap: 13 (ref 5–15)
BUN: 8 mg/dL (ref 6–20)
CO2: 19 mmol/L — ABNORMAL LOW (ref 22–32)
Calcium: 8.5 mg/dL — ABNORMAL LOW (ref 8.9–10.3)
Chloride: 103 mmol/L (ref 98–111)
Creatinine, Ser: 0.98 mg/dL (ref 0.44–1.00)
GFR calc Af Amer: >60 mL/min (ref >60)
GFR calc non Af Amer: >60 mL/min (ref >60)
Glucose, Bld: 89 mg/dL (ref 70–99)
Potassium: 3.5 mmol/L (ref 3.5–5.1)
Sodium: 135 mmol/L (ref 135–145)
Total Bilirubin: 1.3 mg/dL — ABNORMAL HIGH (ref 0.3–1.2)
Total Protein: 6.1 g/dL — ABNORMAL LOW (ref 6.5–8.1)

## 2019-10-30 SURGERY — Surgical Case
Anesthesia: Spinal

## 2019-10-30 MED ORDER — NALBUPHINE HCL 10 MG/ML IJ SOLN: 5.0000 mg | Freq: Once | INTRAMUSCULAR | Status: DC | PRN

## 2019-10-30 MED ORDER — COCONUT OIL OIL: 1.0000 "application " | TOPICAL_OIL | Status: DC | PRN

## 2019-10-30 MED ORDER — SIMETHICONE 80 MG PO CHEW
80.0000 mg | CHEWABLE_TABLET | Freq: Three times a day (TID) | ORAL | Status: DC
  Administered 2019-10-30 – 2019-11-01 (×5): 80 mg via ORAL
  Filled 2019-10-30 (×6): qty 1

## 2019-10-30 MED ORDER — SODIUM CHLORIDE 0.9 % IR SOLN
Status: DC | PRN
  Administered 2019-10-30: 1

## 2019-10-30 MED ORDER — LEVONORGESTREL 19.5 MCG/DAY IU IUD: INTRAUTERINE_SYSTEM | Freq: Once | INTRAUTERINE | Status: DC

## 2019-10-30 MED ORDER — DIBUCAINE (PERIANAL) 1 % EX OINT: 1.0000 "application " | TOPICAL_OINTMENT | CUTANEOUS | Status: DC | PRN

## 2019-10-30 MED ORDER — NALOXONE HCL 0.4 MG/ML IJ SOLN: 0.4000 mg | INTRAMUSCULAR | Status: DC | PRN

## 2019-10-30 MED ORDER — IBUPROFEN 800 MG PO TABS
800.0000 mg | ORAL_TABLET | Freq: Four times a day (QID) | ORAL | Status: DC
  Administered 2019-10-31 – 2019-11-01 (×5): 800 mg via ORAL
  Filled 2019-10-30 (×5): qty 1

## 2019-10-30 MED ORDER — FENTANYL CITRATE (PF) 100 MCG/2ML IJ SOLN
25.0000 ug | INTRAMUSCULAR | Status: DC | PRN
  Administered 2019-10-30: 25 ug via INTRAVENOUS
  Administered 2019-10-30: 50 ug via INTRAVENOUS

## 2019-10-30 MED ORDER — FENTANYL CITRATE (PF) 100 MCG/2ML IJ SOLN
INTRAMUSCULAR | Status: AC
  Filled 2019-10-30: qty 2

## 2019-10-30 MED ORDER — ZOLPIDEM TARTRATE 5 MG PO TABS: 5.0000 mg | ORAL_TABLET | Freq: Every evening | ORAL | Status: DC | PRN

## 2019-10-30 MED ORDER — PHENYLEPHRINE HCL (PRESSORS) 10 MG/ML IV SOLN
INTRAVENOUS | Status: DC | PRN
  Administered 2019-10-30: 80 ug via INTRAVENOUS
  Administered 2019-10-30: 40 ug via INTRAVENOUS
  Administered 2019-10-30: 80 ug via INTRAVENOUS

## 2019-10-30 MED ORDER — WITCH HAZEL-GLYCERIN EX PADS: 1.0000 "application " | MEDICATED_PAD | CUTANEOUS | Status: DC | PRN

## 2019-10-30 MED ORDER — KETOROLAC TROMETHAMINE 30 MG/ML IJ SOLN: 30.0000 mg | Freq: Four times a day (QID) | INTRAMUSCULAR | Status: AC | PRN

## 2019-10-30 MED ORDER — DIPHENHYDRAMINE HCL 25 MG PO CAPS: 25.0000 mg | ORAL_CAPSULE | Freq: Four times a day (QID) | ORAL | Status: DC | PRN

## 2019-10-30 MED ORDER — SIMETHICONE 80 MG PO CHEW
80.0000 mg | CHEWABLE_TABLET | ORAL | Status: DC | PRN
  Administered 2019-10-30: 80 mg via ORAL

## 2019-10-30 MED ORDER — KETOROLAC TROMETHAMINE 30 MG/ML IJ SOLN: 30.0000 mg | Freq: Once | INTRAMUSCULAR | Status: DC

## 2019-10-30 MED ORDER — PNEUMOCOCCAL VAC POLYVALENT 25 MCG/0.5ML IJ INJ
0.5000 mL | INJECTION | INTRAMUSCULAR | Status: AC
  Administered 2019-10-31: 0.5 mL via INTRAMUSCULAR
  Filled 2019-10-30: qty 0.5

## 2019-10-30 MED ORDER — DEXTROSE 5 % IV SOLN
INTRAVENOUS | Status: AC
  Filled 2019-10-30: qty 3000

## 2019-10-30 MED ORDER — SODIUM CHLORIDE 0.9 % IV SOLN: INTRAVENOUS | Status: DC | PRN

## 2019-10-30 MED ORDER — AMLODIPINE BESYLATE 5 MG PO TABS
5.0000 mg | ORAL_TABLET | Freq: Every day | ORAL | Status: DC
  Administered 2019-10-30: 5 mg via ORAL
  Filled 2019-10-30: qty 1

## 2019-10-30 MED ORDER — GUAIFENESIN-DM 100-10 MG/5ML PO SYRP
5.0000 mL | ORAL_SOLUTION | ORAL | Status: DC | PRN
  Filled 2019-10-30: qty 5

## 2019-10-30 MED ORDER — KETOROLAC TROMETHAMINE 30 MG/ML IJ SOLN
30.0000 mg | Freq: Four times a day (QID) | INTRAMUSCULAR | Status: AC
  Administered 2019-10-30 – 2019-10-31 (×2): 30 mg via INTRAVENOUS
  Filled 2019-10-30 (×3): qty 1

## 2019-10-30 MED ORDER — FENTANYL CITRATE (PF) 100 MCG/2ML IJ SOLN
INTRAMUSCULAR | Status: DC | PRN
  Administered 2019-10-30: 15 ug via INTRATHECAL

## 2019-10-30 MED ORDER — HYDROMORPHONE HCL 1 MG/ML IJ SOLN
INTRAMUSCULAR | Status: DC | PRN
  Administered 2019-10-30: .1 mg via INTRAVENOUS

## 2019-10-30 MED ORDER — SENNOSIDES-DOCUSATE SODIUM 8.6-50 MG PO TABS
2.0000 | ORAL_TABLET | ORAL | Status: DC
  Administered 2019-10-30 – 2019-10-31 (×2): 2 via ORAL
  Filled 2019-10-30 (×2): qty 2

## 2019-10-30 MED ORDER — KETOROLAC TROMETHAMINE 30 MG/ML IJ SOLN
INTRAMUSCULAR | Status: AC
  Filled 2019-10-30: qty 1

## 2019-10-30 MED ORDER — NALBUPHINE HCL 10 MG/ML IJ SOLN: 5.0000 mg | INTRAMUSCULAR | Status: DC | PRN

## 2019-10-30 MED ORDER — PROMETHAZINE HCL 25 MG/ML IJ SOLN: 6.2500 mg | INTRAMUSCULAR | Status: DC | PRN

## 2019-10-30 MED ORDER — PHENYLEPHRINE HCL-NACL 20-0.9 MG/250ML-% IV SOLN
INTRAVENOUS | Status: AC
  Filled 2019-10-30: qty 250

## 2019-10-30 MED ORDER — OXYCODONE HCL 5 MG PO TABS
5.0000 mg | ORAL_TABLET | ORAL | Status: DC | PRN
  Administered 2019-10-30 – 2019-11-01 (×6): 10 mg via ORAL
  Filled 2019-10-30 (×6): qty 2

## 2019-10-30 MED ORDER — LACTATED RINGERS IV SOLN: INTRAVENOUS | Status: DC

## 2019-10-30 MED ORDER — ACETAMINOPHEN 10 MG/ML IV SOLN
INTRAVENOUS | Status: AC
  Filled 2019-10-30: qty 100

## 2019-10-30 MED ORDER — DIPHENHYDRAMINE HCL 25 MG PO CAPS: 25.0000 mg | ORAL_CAPSULE | ORAL | Status: DC | PRN

## 2019-10-30 MED ORDER — ONDANSETRON HCL 4 MG/2ML IJ SOLN
INTRAMUSCULAR | Status: DC | PRN
  Administered 2019-10-30: 4 mg via INTRAVENOUS

## 2019-10-30 MED ORDER — SOD CITRATE-CITRIC ACID 500-334 MG/5ML PO SOLN
30.0000 mL | ORAL | Status: AC
  Administered 2019-10-30: 30 mL via ORAL

## 2019-10-30 MED ORDER — SIMETHICONE 80 MG PO CHEW
80.0000 mg | CHEWABLE_TABLET | ORAL | Status: DC
  Administered 2019-11-01: 80 mg via ORAL
  Filled 2019-10-30 (×2): qty 1

## 2019-10-30 MED ORDER — TETANUS-DIPHTH-ACELL PERTUSSIS 5-2.5-18.5 LF-MCG/0.5 IM SUSP: 0.5000 mL | Freq: Once | INTRAMUSCULAR | Status: DC

## 2019-10-30 MED ORDER — ONDANSETRON HCL 4 MG/2ML IJ SOLN: 4.0000 mg | Freq: Three times a day (TID) | INTRAMUSCULAR | Status: DC | PRN

## 2019-10-30 MED ORDER — LEVONORGESTREL 19.5 MCG/DAY IU IUD
INTRAUTERINE_SYSTEM | INTRAUTERINE | Status: DC | PRN
  Administered 2019-10-30: 1 via INTRAUTERINE

## 2019-10-30 MED ORDER — SOD CITRATE-CITRIC ACID 500-334 MG/5ML PO SOLN
ORAL | Status: AC
  Filled 2019-10-30: qty 30

## 2019-10-30 MED ORDER — PHENYLEPHRINE HCL-NACL 20-0.9 MG/250ML-% IV SOLN
INTRAVENOUS | Status: DC | PRN
  Administered 2019-10-30: 60 ug/min via INTRAVENOUS

## 2019-10-30 MED ORDER — BENZONATATE 100 MG PO CAPS
200.0000 mg | ORAL_CAPSULE | Freq: Two times a day (BID) | ORAL | Status: DC
  Administered 2019-10-30 – 2019-10-31 (×3): 200 mg via ORAL
  Filled 2019-10-30 (×6): qty 2

## 2019-10-30 MED ORDER — OXYTOCIN 40 UNITS IN NORMAL SALINE INFUSION - SIMPLE MED: 2.5000 [IU]/h | INTRAVENOUS | Status: AC

## 2019-10-30 MED ORDER — DIPHENHYDRAMINE HCL 50 MG/ML IJ SOLN: 12.5000 mg | INTRAMUSCULAR | Status: DC | PRN

## 2019-10-30 MED ORDER — OXYTOCIN 40 UNITS IN NORMAL SALINE INFUSION - SIMPLE MED
INTRAVENOUS | Status: AC
  Filled 2019-10-30: qty 1000

## 2019-10-30 MED ORDER — HYDROMORPHONE HCL 1 MG/ML IJ SOLN
INTRAMUSCULAR | Status: AC
  Filled 2019-10-30: qty 1

## 2019-10-30 MED ORDER — ONDANSETRON HCL 4 MG/2ML IJ SOLN
INTRAMUSCULAR | Status: AC
  Filled 2019-10-30: qty 2

## 2019-10-30 MED ORDER — BUPIVACAINE IN DEXTROSE 0.75-8.25 % IT SOLN
INTRATHECAL | Status: DC | PRN
  Administered 2019-10-30: 1.6 mL via INTRATHECAL

## 2019-10-30 MED ORDER — MENTHOL 3 MG MT LOZG: 1.0000 | LOZENGE | OROMUCOSAL | Status: DC | PRN

## 2019-10-30 MED ORDER — LACTATED RINGERS IV BOLUS
1000.0000 mL | Freq: Once | INTRAVENOUS | Status: AC
  Administered 2019-10-30: 1000 mL via INTRAVENOUS

## 2019-10-30 MED ORDER — ENOXAPARIN SODIUM 80 MG/0.8ML ~~LOC~~ SOLN
0.5000 mg/kg | SUBCUTANEOUS | Status: DC
  Administered 2019-10-31 – 2019-11-01 (×2): 65 mg via SUBCUTANEOUS
  Filled 2019-10-30 (×2): qty 0.8

## 2019-10-30 MED ORDER — OXYTOCIN 40 UNITS IN NORMAL SALINE INFUSION - SIMPLE MED
INTRAVENOUS | Status: DC | PRN
  Administered 2019-10-30: 40 [IU] via INTRAVENOUS

## 2019-10-30 MED ORDER — LEVONORGESTREL 19.5 MCG/DAY IU IUD
INTRAUTERINE_SYSTEM | INTRAUTERINE | Status: AC
  Filled 2019-10-30: qty 1

## 2019-10-30 MED ORDER — SODIUM CHLORIDE 0.9% FLUSH: 3.0000 mL | INTRAVENOUS | Status: DC | PRN

## 2019-10-30 MED ORDER — OXYCODONE HCL 5 MG PO TABS
10.0000 mg | ORAL_TABLET | Freq: Once | ORAL | Status: AC
  Administered 2019-10-30: 10 mg via ORAL
  Filled 2019-10-30: qty 2

## 2019-10-30 MED ORDER — NALOXONE HCL 4 MG/10ML IJ SOLN
1.0000 ug/kg/h | INTRAVENOUS | Status: DC | PRN
  Filled 2019-10-30: qty 5

## 2019-10-30 MED ORDER — PRENATAL MULTIVITAMIN CH
1.0000 | ORAL_TABLET | Freq: Every day | ORAL | Status: DC
  Administered 2019-10-31: 1 via ORAL
  Filled 2019-10-30 (×2): qty 1

## 2019-10-30 MED ORDER — DEXTROSE 5 % IV SOLN
3.0000 g | INTRAVENOUS | Status: AC
  Administered 2019-10-30: 09:00:00 3 g via INTRAVENOUS

## 2019-10-30 MED ORDER — KETOROLAC TROMETHAMINE 30 MG/ML IJ SOLN
30.0000 mg | Freq: Four times a day (QID) | INTRAMUSCULAR | Status: AC | PRN
  Administered 2019-10-30: 30 mg via INTRAVENOUS

## 2019-10-30 MED ORDER — ACETAMINOPHEN 325 MG PO TABS
650.0000 mg | ORAL_TABLET | Freq: Four times a day (QID) | ORAL | Status: DC | PRN
  Administered 2019-10-31 – 2019-11-01 (×2): 650 mg via ORAL
  Filled 2019-10-30 (×2): qty 2

## 2019-10-30 MED ORDER — BUPRENORPHINE HCL-NALOXONE HCL 8-2 MG SL SUBL
1.0000 | SUBLINGUAL_TABLET | Freq: Two times a day (BID) | SUBLINGUAL | Status: DC
  Administered 2019-10-30 – 2019-11-01 (×5): 1 via SUBLINGUAL
  Filled 2019-10-30 (×5): qty 1

## 2019-10-30 MED ORDER — STERILE WATER FOR IRRIGATION IR SOLN
Status: DC | PRN
  Administered 2019-10-30: 1000 mL

## 2019-10-30 MED ORDER — ACETAMINOPHEN 500 MG PO TABS
1000.0000 mg | ORAL_TABLET | Freq: Four times a day (QID) | ORAL | Status: AC
  Administered 2019-10-30 – 2019-10-31 (×4): 1000 mg via ORAL
  Filled 2019-10-30 (×3): qty 2

## 2019-10-30 MED ORDER — NICOTINE 14 MG/24HR TD PT24
14.0000 mg | MEDICATED_PATCH | Freq: Every day | TRANSDERMAL | Status: DC
  Administered 2019-10-30 – 2019-10-31 (×2): 14 mg via TRANSDERMAL
  Filled 2019-10-30 (×3): qty 1

## 2019-10-30 SURGICAL SUPPLY — 38 items
APL SKNCLS STERI-STRIP NONHPOA (GAUZE/BANDAGES/DRESSINGS) ×1
BENZOIN TINCTURE PRP APPL 2/3 (GAUZE/BANDAGES/DRESSINGS) ×3 IMPLANT
CANISTER SUCT 3000ML PPV (MISCELLANEOUS) ×3 IMPLANT
CHLORAPREP W/TINT 26ML (MISCELLANEOUS) ×3 IMPLANT
CLOSURE WOUND 1/2 X4 (GAUZE/BANDAGES/DRESSINGS) ×2
DRSG OPSITE POSTOP 4X10 (GAUZE/BANDAGES/DRESSINGS) ×3 IMPLANT
ELECT REM PT RETURN 9FT ADLT (ELECTROSURGICAL) ×3
ELECTRODE REM PT RTRN 9FT ADLT (ELECTROSURGICAL) ×1 IMPLANT
EXTRACTOR VACUUM KIWI (MISCELLANEOUS) ×3 IMPLANT
GAUZE SPONGE 4X4 12PLY STRL LF (GAUZE/BANDAGES/DRESSINGS) ×4 IMPLANT
GLOVE BIOGEL PI IND STRL 7.0 (GLOVE) ×2 IMPLANT
GLOVE BIOGEL PI IND STRL 7.5 (GLOVE) ×1 IMPLANT
GLOVE BIOGEL PI INDICATOR 7.0 (GLOVE) ×4
GLOVE BIOGEL PI INDICATOR 7.5 (GLOVE) ×2
GLOVE SKINSENSE NS SZ7.0 (GLOVE) ×2
GLOVE SKINSENSE STRL SZ7.0 (GLOVE) ×1 IMPLANT
GOWN STRL REUS W/ TWL LRG LVL3 (GOWN DISPOSABLE) ×2 IMPLANT
GOWN STRL REUS W/ TWL XL LVL3 (GOWN DISPOSABLE) ×1 IMPLANT
GOWN STRL REUS W/TWL LRG LVL3 (GOWN DISPOSABLE) ×6
GOWN STRL REUS W/TWL XL LVL3 (GOWN DISPOSABLE) ×3
NS IRRIG 1000ML POUR BTL (IV SOLUTION) ×3 IMPLANT
PACK C SECTION WH (CUSTOM PROCEDURE TRAY) ×3 IMPLANT
PAD ABD 7.5X8 STRL (GAUZE/BANDAGES/DRESSINGS) ×3 IMPLANT
PAD OB MATERNITY 4.3X12.25 (PERSONAL CARE ITEMS) ×3 IMPLANT
PAD PREP 24X48 CUFFED NSTRL (MISCELLANEOUS) ×3 IMPLANT
PENCIL SMOKE EVAC W/HOLSTER (ELECTROSURGICAL) ×3 IMPLANT
RETRACTOR TRAXI PANNICULUS (MISCELLANEOUS) IMPLANT
STRIP CLOSURE SKIN 1/2X4 (GAUZE/BANDAGES/DRESSINGS) ×3 IMPLANT
SUT MNCRL 0 VIOLET CTX 36 (SUTURE) ×2 IMPLANT
SUT MON AB 4-0 PS1 27 (SUTURE) ×3 IMPLANT
SUT MONOCRYL 0 CTX 36 (SUTURE) ×4
SUT PLAIN 2 0 XLH (SUTURE) ×3 IMPLANT
SUT VIC AB 0 CT1 36 (SUTURE) ×6 IMPLANT
SUT VIC AB 3-0 CT1 27 (SUTURE) ×3
SUT VIC AB 3-0 CT1 TAPERPNT 27 (SUTURE) ×1 IMPLANT
TOWEL OR 17X24 6PK STRL BLUE (TOWEL DISPOSABLE) ×6 IMPLANT
TRAXI PANNICULUS RETRACTOR (MISCELLANEOUS) ×2
WATER STERILE IRR 1000ML POUR (IV SOLUTION) ×3 IMPLANT

## 2019-10-30 NOTE — Transfer of Care (Signed)
Immediate Anesthesia Transfer of Care Note  Patient: Sydney Griffin  Procedure(s) Performed: CESAREAN SECTION (N/A )  Patient Location: PACU  Anesthesia Type:Spinal  Level of Consciousness: awake, alert  and oriented  Airway & Oxygen Therapy: Patient Spontanous Breathing  Post-op Assessment: Report given to RN and Post -op Vital signs reviewed and stable  Post vital signs: Reviewed and stable  Last Vitals:  Vitals Value Taken Time  BP 127/94 10/30/19 1045  Temp    Pulse 67 10/30/19 1054  Resp 16 10/30/19 1054  SpO2 100 % 10/30/19 1054  Vitals shown include unvalidated device data.  Last Pain:  Vitals:   10/30/19 1045  TempSrc: (P) Oral         Complications: No apparent anesthesia complications

## 2019-10-30 NOTE — Anesthesia Procedure Notes (Signed)
Epidural Patient location during procedure: OR Start time: 10/30/2019 9:08 AM End time: 10/30/2019 9:11 AM  Staffing Anesthesiologist: Kaylyn Layer, MD Performed: anesthesiologist   Preanesthetic Checklist Completed: patient identified, IV checked, risks and benefits discussed, monitors and equipment checked, pre-op evaluation and timeout performed  Epidural Patient position: sitting Prep: DuraPrep and site prepped and draped Patient monitoring: heart rate, continuous pulse ox and blood pressure Approach: midline Location: L3-L4 Injection technique: LOR air  Needle:  Needle type: Tuohy  Needle gauge: 17 G Needle length: 9 cm Needle insertion depth: 8 cm Catheter type: closed end flexible Catheter size: 19 Gauge Catheter at skin depth: 13 cm  Assessment Sensory level: T4 Events: blood not aspirated, injection not painful, no injection resistance, no paresthesia and negative IV test  Additional Notes Patient identified. Risks, benefits, and alternatives discussed with patient including but not limited to bleeding, infection, nerve damage, paralysis, failed block, incomplete pain control, headache, blood pressure changes, nausea, vomiting, reactions to medication, itching, and postpartum back pain. Confirmed the patient's most recent platelet count. Confirmed with patient that they are not currently taking any anticoagulation, have any bleeding history, or any family history of bleeding disorders. Patient expressed understanding and wished to proceed. All questions were answered. Sterile technique was used throughout the entire procedure.  Crisp LOR with Tuohy needle on first pass. [place] 25g spinal needle introduced through Tuohy with clear CSF return prior to injection of intrathecal medication. Spinal needle withdrawn and epidural catheter threaded easily. Negative aspiration of catheter for heme or CSF. NO TEST DOSE GIVEN.  Reason for block:surgical anesthesia

## 2019-10-30 NOTE — Discharge Summary (Signed)
Postpartum Discharge Summary     Patient Name: Sydney Griffin DOB: June 26, 1981 MRN: [id number]  Date of admission: 10/30/2019 Delivering Provider: Chauncey Mann   Date of discharge: 11/01/2019  Admitting diagnosis: History of cesarean section [Z98.891] Intrauterine pregnancy: [redacted]w[redacted]d    Secondary diagnosis:  Active Problems:   Morbid [place] ([place]   Chronic hypertension during pregnancy   Tobacco use complicating pregnancy   Suboxone maintenance treatment complicating pregnancy, [place]   AMA (advanced maternal age) multigravida 35+   History of cesarean section   IUD (intrauterine device) in place  Additional problems: None     Discharge diagnosis: Term Pregnancy Delivered and CHTN                                                                                                Post partum procedures:None  Augmentation: NA  Complications: None  Hospital course:  Sceduled C/S   39y.o. yo [id number] admitted to the hospital 10/30/2019 for scheduled cesarean section with the following indication: 3 prior c-sections.  Membrane Rupture Time/Date: 9:48 AM ,10/30/2019   Patient delivered a Viable infant.10/30/2019  Details of operation can be found in separate operative note. IUD placed intra-operatively. Labetalol discontinued for cHTN and Norvasc 10 mg initiated with better BP control which was prescribed on discharge. Home dose Suboxone continued post-partum. Nicotine patch placed while in hospital. Pateint had an uncomplicated postpartum course.  She is ambulating, tolerating a regular diet, passing flatus, and urinating well. Patient is discharged home in stable condition on  11/01/19        Delivery time: 9:48 AM    Magnesium Sulfate received: No BMZ received: No Rhophylac:No MMR:No Transfusion:No  Physical exam  Vitals:   10/31/19 2000 10/31/19 2055 10/31/19 2240 11/01/19 0610  BP: (!) 166/82 (!) 155/77 136/83 136/75  Pulse:  73 86 71  Resp:   18  17  Temp:   98.3 F (36.8 C) 98.2 F (36.8 C)  TempSrc:   Oral Oral  SpO2:   99% 97%  Weight:      Height:       General: alert, cooperative and no distress Lochia: appropriate Uterine Fundus: firm Incision: Healing well with no significant drainage DVT Evaluation: No evidence of DVT seen on physical exam. Labs: Lab Results  Component Value Date   WBC 10.4 10/31/2019   HGB 9.0 (L) 10/31/2019   HCT 27.0 (L) 10/31/2019   MCV 97.1 10/31/2019   PLT 162 10/31/2019   CMP Latest Ref Rng & Units 10/30/2019  Glucose 70 - 99 mg/dL 89  BUN 6 - 20 mg/dL 8  Creatinine 0.44 - 1.00 mg/dL 0.98  Sodium 135 - 145 mmol/L 135  Potassium 3.5 - 5.1 mmol/L 3.5  Chloride 98 - 111 mmol/L 103  CO2 22 - 32 mmol/L 19(L)  Calcium 8.9 - 10.3 mg/dL 8.5(L)  Total Protein 6.5 - 8.1 g/dL 6.1(L)  Total Bilirubin 0.3 - 1.2 mg/dL 1.3(H)  Alkaline Phos 38 - 126 U/L 148(H)  AST 15 - 41 U/L 19  ALT 0 - 44 U/L 11  Edinburgh Score: No flowsheet data found.  Discharge instruction: per After Visit Summary and "Baby and Me Booklet".  After visit meds:  Allergies as of 11/01/2019      Reactions   Aspirin Shortness Of Breath   Dilaudid [hydromorphone Hcl] Other (See Comments)   Pt says medication causes her to shake uncontrollably.   Ibuprofen Other (See Comments)   Gi upset   Tramadol Nausea Only, Other (See Comments)   Causes headache.   Vicodin [hydrocodone-acetaminophen] Other (See Comments)   Causes headache.      Medication List    STOP taking these medications   aspirin-acetaminophen-caffeine 250-250-65 MG tablet Commonly known as: EXCEDRIN MIGRAINE   labetalol 200 MG tablet Commonly known as: NORMODYNE   metoCLOPramide 10 MG tablet Commonly known as: Reglan   promethazine 25 MG tablet Commonly known as: PHENERGAN     TAKE these medications   acetaminophen 325 MG tablet Commonly known as: TYLENOL Take 2 tablets (650 mg total) by mouth every 6 (six) hours as needed for mild pain  (temperature > 101.5.).   amLODipine 10 MG tablet Commonly known as: NORVASC Take 1 tablet (10 mg total) by mouth at bedtime.   Blood Pressure Kit Monitor blood pressure   buprenorphine-naloxone 8-2 mg Subl SL tablet Commonly known as: SUBOXONE Place 1 tablet under the tongue daily. Pt states she takes anywhere from 1-3  daily   ibuprofen 800 MG tablet Commonly known as: ADVIL Take 1 tablet (800 mg total) by mouth every 6 (six) hours.   lansoprazole 15 MG capsule Commonly known as: PREVACID Take 15 mg by mouth 2 (two) times daily before a meal.   oxyCODONE 5 MG immediate release tablet Commonly known as: Oxy IR/ROXICODONE Take 1-2 tablets (5-10 mg total) by mouth every 4 (four) hours as needed for moderate pain.   senna-docusate 8.6-50 MG tablet Commonly known as: Senokot-S Take 2 tablets by mouth daily. Start taking on: November 02, 2019       Diet: routine diet  Activity: Advance as tolerated. Pelvic rest for 6 weeks.   Outpatient follow up:4 weeks Follow up Appt: Future Appointments  Date Time Provider [place]  11/08/2019  1:15 PM [place] None  11/27/2019  1:00 PM Anyanwu, Sallyanne Havers, MD [facility] None   Follow up Visit:    Please schedule this patient for Postpartum visit in: 4 weeks with the following provider: Any provider In-Person For C/S patients schedule nurse incision check in weeks 2 weeks: yes High risk pregnancy complicated by: HTN Delivery mode:  CS Anticipated Birth Control:  IUD PP Procedures needed: IUD string check, BP check and incision check  Schedule Integrated BH visit: no     Newborn Data: Live born female  Birth Weight: 3525g APGAR: 87, 8  Newborn Delivery   Birth date/time: 10/30/2019 09:48:00 Delivery type: C-Section, Low Transverse Trial of labor: No C-section categorization: Repeat      Baby Feeding: Breast Disposition:rooming in   11/01/2019 Chauncey Mann, MD

## 2019-10-30 NOTE — Op Note (Addendum)
Sydney Griffin PROCEDURE DATE: 10/30/2019  PREOPERATIVE DIAGNOSES: Intrauterine pregnancy at [redacted]w[redacted]d weeks gestation;  3 prior c-sections; desires contraception  POSTOPERATIVE DIAGNOSES: The same  PROCEDURE: Repeat Low Transverse Cesarean Section with Liletta IUD insertion  SURGEON:  Dr. Aletha Halim - Primary Dr. Barrington Ellison - Fellow  ASSISTANT: An experienced assistant was required given the standard of surgical care given the complexity of the case.  This assistant was needed for exposure, dissection, suctioning, retraction, instrument exchange, assisting with delivery with administration of fundal pressure and for overall help during the procedure.  ANESTHESIOLOGY TEAM: Anesthesiologist: Brennan Bailey, MD CRNA: Elenore Paddy, CRNA  INDICATIONS: Sydney Griffin is a 39 y.o. [phone] at [redacted]w[redacted]d here for cesarean section secondary to the indications listed under preoperative diagnoses; please see preoperative note for further details.  The risks of cesarean section were discussed with the patient including but were not limited to: bleeding which may require transfusion or reoperation; infection which may require antibiotics; injury to bowel, bladder, ureters or other surrounding organs; injury to the fetus; need for additional procedures including hysterectomy in the event of a life-threatening hemorrhage; placental abnormalities wth subsequent pregnancies, incisional problems, thromboembolic phenomenon and other postoperative/anesthesia complications.   The patient concurred with the proposed plan, giving informed written consent for the procedure.   Patient also desired PP IUD insertion (Liletta). Discussed risks of irregular bleeding, cramping, infection, malpositioning or misplacement of the IUD outside the uterus which may require further procedure such as laparoscopy. Discussed higher risk of expulsion in post-partum period. Also discussed >99% contraception efficacy, increased  risk of ectopic pregnancy with failure of method.  FINDINGS:  Viable female infant in cephalic presentation. Clear amniotic fluid.  Intact placenta, three vessel cord.  Normal uterus, fallopian tubes and ovaries bilaterally. Of note, there were not intra-abdominal adhesions.  APGAR (1 MIN): 8   APGAR (5 MINS): 8    ANESTHESIA: CSE INTRAVENOUS FLUIDS: 1000 ml   ESTIMATED BLOOD LOSS: 388 ml URINE OUTPUT:  100 ml SPECIMENS: Placenta sent to L&D COMPLICATIONS: None immediate  PROCEDURE IN DETAIL:  The patient preoperatively received intravenous antibiotics and had sequential compression devices applied to her lower extremities.  She was then taken to the operating room where spinal anesthesia was administered and was found to be adequate. She was then placed in a dorsal supine position with a leftward tilt, and prepped and draped in a sterile manner.  A foley catheter was placed into her bladder and attached to constant gravity.  After an adequate timeout was performed, a Pfannenstiel skin incision was made with scalpel on her preexisting scar and carried through to the underlying layer of fascia. The fascia was incised in the midline, and this incision was extended bilaterally using the Mayo scissors.  Kocher clamps were applied to the superior aspect of the fascial incision and the underlying rectus muscles were dissected off bluntly and sharply.  A similar process was carried out on the inferior aspect of the fascial incision. The rectus muscles were separated in the midline and the peritoneum was entered bluntly. Attention was turned to the lower uterine segment where a bladder flap was created, bladder bladed inserted and then a low transverse hysterotomy was made with a scalpel and extended bilaterally bluntly.  The infant was successfully delivered from OP position, the cord was clamped and cut after one minute, and the infant was handed over to the awaiting neonatology team. Uterine massage was then  administered, and the placenta delivered intact with  a three-vessel cord. The uterus was then exteriorized. The uterus was then cleared of clots and debris. Liletta IUD removed from manufacturer insertor, strings trimmed and then placed manually to fundus and strings directed through cervix manually. The hysterotomy was closed with 0 Monocryl in a running locked fashion, and an imbricating layer was also placed with 0 Monocryl. The pelvis was cleared of all clot and debris and irrigated. Hemostasis was confirmed on all surfaces and uterus returned to pelvic cavity. The peritoneum was closed with a 3-0 Vicryl running stitch. The fascia was then closed using 0 Vicryl in a running fashion.  The subcutaneous layer was irrigated, reapproximated with 2-0 plain gut running stitches, and the skin was closed with a 4-0 Vicryl subcuticular stitch. The patient tolerated the procedure well. Sponge, instrument and needle counts were correct x 3.  She was taken to the recovery room in stable condition.   Jerilynn Birkenhead, MD [facility] Fellow, [facility] for [facility], [facility]  Agree with above. I was present and scrubbed for the entire procedure.   Cornelia Copa MD Attending Center for [employer] Midwife)

## 2019-10-30 NOTE — Lactation Note (Signed)
This note was copied from a baby's chart. Lactation Consultation Note  Patient Name: Sydney Griffin [facility] Date: 10/30/2019  This is moms 4th baby.  First one she has ever breastfed.  Mom reports she wanted to breastfeed in the past but dad would always want her to give bottles.  Infant now 7 hours old and DAT positive.  Mom reports she feels she breastfed well.  Mom reports all of her other children were jaundiced.  Two required photo theraphy. Mom on Suboxone maintenance theraphy. Infant born at 34 weeks via csection. Urged mom to feed on cue and 8-12 or more times/day.  Mom falling asleep while talking to her.  Discussed post pumping since infant DAT positive. Discussed hand expression.  Mom able to demonstrate hand expression easily. Large drops of colostrum easily expressed. Mom not sure when infant last ate.  Urged her to keep track of infants voids/stools and feeds on yellow sheet. Urged mom to call for feeding assist and to inittiate hand expression and pumping with DEBP.  Mom asked about pump for home use.  Let her know we could give manual pump to go home.  Sent [facility] referral for breastfeeding follow up.    Maternal Data    Feeding    LATCH Score Latch: Repeated attempts needed to sustain latch, nipple held in mouth throughout feeding, stimulation needed to elicit sucking reflex.  Audible Swallowing: A few with stimulation  Type of Nipple: Everted at rest and after stimulation  Comfort (Breast/Nipple): Soft / non-tender  Hold (Positioning): Assistance needed to correctly position infant at breast and maintain latch.  LATCH Score: 7  Interventions Interventions: Breast feeding basics reviewed;Assisted with latch;Skin to skin;Hand express;Support pillows;Position options;Adjust position  Lactation Tools Discussed/Used     Consult Status      Neomia Dear 10/30/2019, 5:25 PM

## 2019-10-30 NOTE — H&P (Signed)
Obstetric Preoperative History and Physical  Sydney Griffin is a 39 y.o. [id number] with IUP at 2w0dpresenting for scheduled cesarean section.  Reports good fetal movement, no bleeding, no contractions, no leaking of fluid.  No acute preoperative concerns.    Cesarean Section Indication: 3 prior c-sections  Prenatal Course Source of Care: [facility]  Pregnancy complications or risks: Patient Active Problem List   Diagnosis Date Noted  . History of cesarean section 10/30/2019  . AMA (advanced maternal age) multigravida 35+ 06/13/2019  . Encounter for supervision of high risk multigravida of advanced maternal age, antepartum 04/11/2019  . Chronic hypertension during pregnancy 04/11/2019  . Tobacco use complicating pregnancy 096/78/9381 . Suboxone maintenance treatment complicating pregnancy, antepartum ([facility] 04/11/2019  . Carpal tunnel syndrome 02/22/2013  . Tobacco abuse 02/15/2013  . Previous cesarean delivery, antepartum 03/27/2012  . Morbid [facility] ([facility] 03/27/2012   She plans to breastfeed She desires IUD for postpartum contraception.   Prenatal labs and studies: ABO, Rh: --/--/O POS, O POS Performed at [facility] [address], [place] [address] [phone] Antibody: NEG (02/20 0846) Rubella: 1.85 (08/11 1352) RPR: NON REACTIVE (02/20 0845)  HBsAg: Negative (08/11 1352)  HIV: Non Reactive (12/02 0929)  GBS:--/Positive (02/17 0504) 2 hr Glucola  WNL and early A1c 5.0 Genetic screening normal Anatomy UK[place]ormal  Prenatal Transfer Tool  Maternal Diabetes: No Genetic Screening: Normal Maternal Ultrasounds/Referrals: Normal Fetal Ultrasounds or other Referrals:  None Maternal Substance Abuse:  No Significant Maternal Medications:  None Significant Maternal Lab Results: Group B Strep positive  Past Medical History:  Diagnosis Date  . Acid reflux   . Asthma May 2013   [facility] and said she had bronchitis, used an  iOccupational psychologistwhile sick and has now lost the inhaler.  .Marland KitchenDysrhythmia    rare palpitations  . Ectopic pregnancy    Had MTX  . Headache(784.0)   . Hypertension   . Menstrual bleeding problem   . Pregnancy complicated by suboxone maintenance, antepartum, unspecified trimester ([facility]     Past Surgical History:  Procedure Laterality Date  . CESAREAN SECTION    . CESAREAN SECTION  03/28/2012   Procedure: CESAREAN SECTION;  Surgeon: LLahoma Crocker MD;  [place];  Service: Gynecology;  Laterality: N/A;  . CESAREAN SECTION N/A 04/20/2013   Procedure: CESAREAN SECTION;  Surgeon: LLahoma Crocker MD;  [facility];  Service: Obstetrics;  Laterality: N/A;  Repeat Cesarean Section   . DENTAL SURGERY    . DILATION AND CURETTAGE OF UTERUS    . LAPAROSCOPY      OB History  Gravida Para Term Preterm AB Living  6 3 3  0 2 3  SAB TAB Ectopic Multiple Live Births      2   3    # Outcome Date GA Lbr Len/2nd Weight Sex Delivery Anes PTL Lv  6 Current           5 Term 04/20/13 [id number] g F CS-LTranv Spinal  LIV  4 Term 03/28/12 [id number] g F CS-LTranv Spinal  LIV  3 Term 03/08/03 41w1w0d32 g F CS-LTranv EPI  LIV  2 Ectopic           1 Ectopic             Social History   Socioeconomic History  . Marital status: Significant Other    Spouse name: Not on file  .  Number of children: Not on file  . Years of education: Not on file  . Highest education level: Not on file  Occupational History  . Not on file  Tobacco Use  . Smoking status: Current Every Day Smoker    Packs/day: 1.00    Years: 14.00    Pack years: 14.00    Types: Cigarettes  . Smokeless tobacco: Never Used  Substance and Sexual Activity  . Alcohol use: No    Alcohol/week: 0.0 standard drinks  . Drug use: Yes    Comment: Suboxone   . Sexual activity: Yes    Partners: Male    Birth control/protection: None  Other Topics Concern  . Not on file  Social History Narrative  . Not on file   Social  Determinants of Health   Financial Resource Strain:   . Difficulty of Paying Living Expenses: Not on file  Food Insecurity:   . Worried About Charity fundraiser in the Last Year: Not on file  . Ran Out of Food in the Last Year: Not on file  Transportation Needs:   . Lack of Transportation (Medical): Not on file  . Lack of Transportation (Non-Medical): Not on file  Physical Activity:   . Days of Exercise per Week: Not on file  . Minutes of Exercise per Session: Not on file  Stress:   . Feeling of Stress : Not on file  Social Connections:   . Frequency of Communication with Friends and Family: Not on file  . Frequency of Social Gatherings with Friends and Family: Not on file  . Attends Religious Services: Not on file  . Active Member of Clubs or Organizations: Not on file  . Attends Archivist Meetings: Not on file  . Marital Status: Not on file    Family History  Problem Relation Age of Onset  . Hypertension Mother   . Diabetes Mother   . Diabetes Father   . Hyperlipidemia Father   . Hypertension Brother   . Hypertension Sister   . Anesthesia problems Neg Hx   . Other Neg Hx     Medications Prior to Admission  Medication Sig Dispense Refill Last Dose  . aspirin-acetaminophen-caffeine (EXCEDRIN MIGRAINE) 250-250-65 MG tablet Take 1 tablet by mouth every 6 (six) hours as needed for headache.     . buprenorphine-naloxone (SUBOXONE) 8-2 mg SUBL SL tablet Place 1 tablet under the tongue daily. Pt states she takes anywhere from 1-3  daily   10/30/2019 at Unknown time  . lansoprazole (PREVACID) 15 MG capsule Take 15 mg by mouth 2 (two) times daily before a meal.   10/29/2019 at Unknown time  . Blood Pressure KIT Monitor blood pressure 1 kit 0   . labetalol (NORMODYNE) 200 MG tablet Take 1 tablet (200 mg total) by mouth 2 (two) times daily. (Patient not taking: Reported on 10/16/2019) 180 tablet 1 Not Taking at Unknown time  . metoCLOPramide (REGLAN) 10 MG tablet Take 1  tablet (10 mg total) by mouth 3 (three) times daily before meals. (Patient taking differently: Take 10 mg by mouth 2 (two) times daily. ) 30 tablet 2   . promethazine (PHENERGAN) 25 MG tablet Take 1 tablet (25 mg total) by mouth every 6 (six) hours as needed for nausea or vomiting. (Patient not taking: Reported on 10/16/2019) 30 tablet 2 Not Taking at Unknown time    Allergies  Allergen Reactions  . Aspirin Shortness Of Breath  . Dilaudid [Hydromorphone Hcl] Other (See Comments)  Pt says medication causes her to shake uncontrollably.  . Ibuprofen Other (See Comments)    Gi upset  . Tramadol Nausea Only and Other (See Comments)    Causes headache.  . Vicodin [Hydrocodone-Acetaminophen] Other (See Comments)    Causes headache.    Review of Systems: Pertinent items noted in HPI and remainder of comprehensive ROS otherwise negative.  Physical Exam: BP 130/82   Pulse (!) 103   Temp 97.9 F (36.6 C) (Oral)   Resp 18   Ht '5\' 9"'$  (1.753 m)   Wt 133.8 kg   LMP 01/29/2019   SpO2 95%   BMI 43.56 kg/m  FHR by Doppler: 134 bpm CONSTITUTIONAL: Well-developed, well-nourished female in no acute distress.  HENT:  Normocephalic, atraumatic, External right and left ear normal.  EYES: Conjunctivae and EOM are normal. No scleral icterus.  NECK: Normal range of motion, supple, no masses SKIN: Skin is warm and dry. No rash noted. Not diaphoretic. No erythema. No pallor. [place]: Alert and oriented to person, place, and time. Normal reflexes, muscle tone coordination. No cranial nerve deficit noted. PSYCHIATRIC: Normal mood and affect. Normal behavior. Normal judgment and thought content. CARDIOVASCULAR: Normal heart rate noted RESPIRATORY: Effort and breath sounds normal, no problems with respiration noted ABDOMEN: Soft, nontender, nondistended, gravid. Well-healed Pfannenstiel incision. PELVIC: Deferred MUSCULOSKELETAL: Normal range of motion. No edema and no tenderness. 2+ distal  pulses.   Pertinent Labs/Studies:   Results for orders placed or performed during the hospital encounter of 10/28/19 (from the past 72 hour(s))  SARS CORONAVIRUS 2 (TAT 6-24 HRS) Nasopharyngeal Nasopharyngeal Swab     Status: None   Collection Time: 10/28/19  8:40 AM   Specimen: Nasopharyngeal Swab  Result Value Ref Range   SARS Coronavirus 2 NEGATIVE NEGATIVE    Comment: (NOTE) SARS-CoV-2 target nucleic acids are NOT DETECTED. The SARS-CoV-2 RNA is generally detectable in upper and lower respiratory specimens during the acute phase of infection. Negative results do not preclude SARS-CoV-2 infection, do not rule out co-infections with other pathogens, and should not be used as the sole basis for treatment or other patient management decisions. Negative results must be combined with clinical observations, patient history, and epidemiological information. The expected result is Negative. Fact Sheet for Patients: SugarRoll.be Fact Sheet for Healthcare Providers: https://www.woods-mathews.com/ This test is not yet approved or cleared by the [place] FDA and  has been authorized for detection and/or diagnosis of SARS-CoV-2 by FDA under an Emergency Use Authorization (EUA). This EUA will remain  in effect (meaning this test can be used) for the duration of the COVID-19 declaration under Section 56 4(b)(1) of the Act, 21 U.S.C. section 360bbb-3(b)(1), unless the authorization is terminated or revoked sooner. Performed at [facility], [place] [address], [place], [place] [address]   CBC     Status: Abnormal   Collection Time: 10/28/19  8:45 AM  Result Value Ref Range   WBC 13.2 (H) 4.0 - 10.5 K/uL   RBC 3.68 (L) 3.87 - 5.11 MIL/uL   Hemoglobin 11.9 (L) 12.0 - 15.0 g/dL   HCT 35.1 (L) 36.0 - 46.0 %   MCV 95.4 80.0 - 100.0 fL   MCH 32.3 26.0 - 34.0 pg   MCHC 33.9 30.0 - 36.0 g/dL   RDW 13.3 11.5 - 15.5 %   Platelets 196 150 - 400 K/uL    nRBC 0.0 0.0 - 0.2 %    Comment: Performed at [facility], [place],  [place] [address]  RPR     Status: None   Collection Time: 10/28/19  8:45 AM  Result Value Ref Range   RPR Ser Ql NON REACTIVE NON REACTIVE    Comment: Performed at [facility], [place] [address] [place], [place] [address]  Type and screen [place]     Status: None   Collection Time: 10/28/19  8:46 AM  Result Value Ref Range   ABO/RH(D) O POS    Antibody Screen NEG    Sample Expiration      10/31/2019,2359 Performed at [facility], [place] [address], [place],  [address]   ABO/Rh     Status: None   Collection Time: 10/28/19  8:46 AM  Result Value Ref Range   ABO/RH(D)      O POS Performed at [place] [address] [place],  [address]     Assessment and Plan: ARICKA GOLDBERGER is a 39 y.o. [id number] at 53w0dbeing admitted for scheduled cesarean section. The risks of cesarean section discussed with the patient included but were not limited to: bleeding which may require transfusion or reoperation; infection which may require antibiotics; injury to bowel, bladder, ureters or other surrounding organs; injury to the fetus; need for additional procedures including hysterectomy in the event of a life-threatening hemorrhage; placental abnormalities with subsequent pregnancies, incisional problems, thromboembolic phenomenon and other postoperative/anesthesia complications. The patient concurred with the proposed plan, giving informed written consent for the procedure. Patient has been NPO since last night she will remain NPO for procedure. Anesthesia and OR aware. Preoperative prophylactic antibiotics and SCDs ordered on call to the OR. To OR when ready.  Patient also desires PP IUD insertion (Liletta). Discussed risks of irregular bleeding, cramping, infection, malpositioning or misplacement of the IUD outside the uterus which may require further  procedure such as laparoscopy. Discussed higher risk of expulsion in post-partum period. Also discussed >99% contraception efficacy, increased risk of ectopic pregnancy with failure of method.  Pregnancy Complications:   - Maternal Obesity  - 3 prior c-sections; reviewed last Op Note  - Current Suboxone use for last year; using Suboxone 8 mg BID; will resume post-partum (reviewed reasonable expectations for pain control)  - Current Tobacco User: Smokes ~ 1 ppd; desires Nicotine patch post-partum  - cHTN: Currently on Labetalol 200 mg BID; consider alternative PP Contraception: PP IUD; consented/ordered Circumcision: NA; girl MOF: Breast   CBarrington Ellison MD OB Family Medicine Fellow, [facility]De[facility]

## 2019-10-30 NOTE — Anesthesia Postprocedure Evaluation (Signed)
Anesthesia Post Note  Patient: Sydney Griffin  Procedure(s) Performed: CESAREAN SECTION (N/A )     Patient location during evaluation: PACU Anesthesia Type: Spinal Level of consciousness: awake and alert and oriented Pain management: pain level controlled Vital Signs Assessment: post-procedure vital signs reviewed and stable Respiratory status: spontaneous breathing, nonlabored ventilation and respiratory function stable Cardiovascular status: blood pressure returned to baseline Postop Assessment: no apparent nausea or vomiting, spinal receding, no headache and no backache Anesthetic complications: no    Last Vitals:  Vitals:   10/30/19 1100 10/30/19 1115  BP: 99/80 139/89  Pulse: 74 70  Resp: 14 17  Temp:    SpO2: 100% 100%    Last Pain:  Vitals:   10/30/19 1045  TempSrc: Oral   Pain Goal:    LLE Motor Response: Purposeful movement (10/30/19 1115) LLE Sensation: Tingling (10/30/19 1115) RLE Motor Response: Purposeful movement (10/30/19 1115) RLE Sensation: Tingling (10/30/19 1115)     Epidural/Spinal Function Cutaneous sensation: Tingles (10/30/19 1115), Patient able to flex knees: Yes (10/30/19 1115), Patient able to lift hips off bed: No (10/30/19 1115), Back pain beyond tenderness at insertion site: No (10/30/19 1115), Progressively worsening motor and/or sensory loss: No (10/30/19 1115), Bowel and/or bladder incontinence post epidural: No (10/30/19 1115)  Kaylyn Layer

## 2019-10-31 LAB — CBC
HCT: 27 % — ABNORMAL LOW (ref 36.0–46.0)
Hemoglobin: 9 g/dL — ABNORMAL LOW (ref 12.0–15.0)
MCH: 32.4 pg (ref 26.0–34.0)
MCHC: 33.3 g/dL (ref 30.0–36.0)
MCV: 97.1 fL (ref 80.0–100.0)
Platelets: 162 10*3/uL (ref 150–400)
RBC: 2.78 MIL/uL — ABNORMAL LOW (ref 3.87–5.11)
RDW: 13.6 % (ref 11.5–15.5)
WBC: 10.4 10*3/uL (ref 4.0–10.5)
nRBC: 0 % (ref 0.0–0.2)

## 2019-10-31 LAB — RPR: RPR Ser Ql: NONREACTIVE

## 2019-10-31 MED ORDER — NIFEDIPINE 10 MG PO CAPS
10.0000 mg | ORAL_CAPSULE | Freq: Once | ORAL | Status: AC
  Administered 2019-10-31: 10 mg via ORAL
  Filled 2019-10-31: qty 1

## 2019-10-31 MED ORDER — AMLODIPINE BESYLATE 5 MG PO TABS
5.0000 mg | ORAL_TABLET | Freq: Once | ORAL | Status: AC
  Administered 2019-10-31: 5 mg via ORAL
  Filled 2019-10-31: qty 1

## 2019-10-31 MED ORDER — AMLODIPINE BESYLATE 5 MG PO TABS: 10.0000 mg | ORAL_TABLET | Freq: Every day | ORAL | Status: DC

## 2019-10-31 NOTE — Lactation Note (Signed)
This note was copied from a baby's chart. Lactation Consultation Note  Patient Name: Girl Shelbia Scinto [id number] Date: 10/31/2019 Reason for consult: Follow-up assessment;Mother's request Telephone call from RN to  assist with feeding.  Infant sucks her top lip and tongue.  Attempt to suck train infant and infant got a little better and attempt to latch her to breast.  Infant would open  And latch and do a few sucks and let go.   Did this approximately 10 minutes then spoon fed infant 8 ml breastmilk.  Infant fell asleep and mom reports she would like to pump later  Feeding Feeding Type: Breast Milk  LATCH Score Latch: Repeated attempts needed to sustain latch, nipple held in mouth throughout feeding, stimulation needed to elicit sucking reflex.  Audible Swallowing: A few with stimulation  Type of Nipple: Everted at rest and after stimulation  Comfort (Breast/Nipple): Soft / non-tender  Hold (Positioning): Assistance needed to correctly position infant at breast and maintain latch.  LATCH Score: 7  Interventions Interventions: Breast massage;Hand pump;DEBP  Lactation Tools Discussed/Used     Consult Status Consult Status: Follow-up Date: 10/31/19 Follow-up type: In-patient    [facility] Michaelle Copas 10/31/2019, 12:23 AM

## 2019-10-31 NOTE — Lactation Note (Signed)
This note was copied from a baby's chart. Lactation Consultation Note  Patient Name: Sydney Griffin [id number] Date: 10/31/2019 Reason for consult: Follow-up assessment;Mother's request Inititiated pumping with mom with DEBP.  Gave and demo both manual Harmony pump and conversion kit as Double pump.  Mom wanted to pump one side at a time right now since she has IV'S.  Stayed with mom while she pumped first side.  Assist getting her pumping on other side.  Urged to pump past the every 3 hour breastfeeding.  Discussed feeding back 5-7 ml tonight after breastfdeeds.  Urged to call lactation as needed.   Maternal Data    Feeding Feeding Type: Breast Milk  LATCH Score Latch: Repeated attempts needed to sustain latch, nipple held in mouth throughout feeding, stimulation needed to elicit sucking reflex.  Audible Swallowing: A few with stimulation  Type of Nipple: Everted at rest and after stimulation  Comfort (Breast/Nipple): Soft / non-tender  Hold (Positioning): Assistance needed to correctly position infant at breast and maintain latch.  LATCH Score: 7  Interventions Interventions: Breast massage;Hand pump;DEBP  Lactation Tools Discussed/Used     Consult Status Consult Status: Follow-up Date: 10/31/19 Follow-up type: In-patient    [facility] Thompson Caul 10/31/2019, 12:28 AM

## 2019-10-31 NOTE — Progress Notes (Addendum)
POSTPARTUM PROGRESS NOTE  Subjective: Sydney Griffin is a 39 y.o. [id number] s/p LTCS at [redacted]w[redacted]d  She reports she doing well. No acute events overnight. She denies any problems with ambulating, voiding or po intake. Denies nausea or vomiting. She has not yet passed flatus. Pain is moderately controlled.  Lochia is less than menses. States she still feels drowsy.  Objective: Blood pressure 137/90, pulse 71, temperature 98 F (36.7 C), temperature source Axillary, resp. rate 18, height 5' 9"  (1.753 m), weight 133.8 kg, last menstrual period 01/29/2019, SpO2 98 %, unknown if currently breastfeeding.  Physical Exam:  General: alert, cooperative and no distress Chest: no respiratory distress Abdomen: soft, non-tender, clean/dry surgical bandage present Uterine Fundus: firm, appropriately tender Extremities: No calf swelling or tenderness  Recent Labs    10/30/19 1233 10/31/19 0520  HGB 10.8* 9.0*  HCT 32.7* 27.0*    Assessment/Plan: Sydney CUDEis a 39y.o. [id number]/p LCTS at 333w0d Routine Postpartum Care: Doing well, pain well-controlled.  -- Continue routine care, lactation support  -- Contraception: IUD -- Feeding: Breast  -- Continue Norvasc 3m71mor cHTN  Dispo: Plan for discharge likely 2/24.  Sydney Griffin Resident, [facility]  I personally saw and evaluated the patient, performing the key elements of the service. I developed and verified the management plan that is described in the resident's/student's note, and I agree with the content with my edits above. VSS, HRR&R, Resp unlabored, Legs neg.  Sydney BertholdNM 11/02/2019 10:14 AM

## 2019-10-31 NOTE — Lactation Note (Addendum)
This note was copied from a baby's chart. Lactation Consultation Note  Patient Name: Sydney Griffin [id number] Date: 10/31/2019 Reason for consult: Follow-up assessment;Term  Infant DAT+ and NAS baby  LC in to visit with P4 Mom of term baby at 74 hrs old. Baby being fed by breast and formula by bottle.  Baby at 6% weight loss.  Mom on suboxone maintenance therapy, has chronic HBP on labetalol, and AMA.    Baby was sent to nursery so Mom could go outside, when she returned she asked for baby to stay in nursery until after she naps.    Mom needing help with pumping.  Mom has large, pendulous breasts and was pumping one breast at a time, added pillow support under her elbows to facilitate Mom double pumping.  Last time Mom pumped was yesterday evening.  Assisted with pumping, changed flange size to 24 for a better fit.  Colostrum being expressed.  Reviewed importance of disassembling pump parts, washing, rinsing and air drying in separate bin provided.   Mom expressed about 4 ml.  Capped it off and told her it was good at room temperature for 6 hrs.  Encouraged Mom to feed this to baby.    Recommended she double pump every 2-3 hrs to stimulate and support her milk supply.  Mom stated that "breastfeeding was driving her crazy".  Encouraged Mom to keep baby STS and ask for help prn for latching.   Consult Status Consult Status: Follow-up Date: 11/01/19 Follow-up type: In-patient    [name] 10/31/2019, 3:49 PM

## 2019-10-31 NOTE — Clinical Social Work Maternal (Signed)
CLINICAL SOCIAL WORK MATERNAL/CHILD NOTE  Patient Details  Name: Sydney Griffin MRN: [id number] Date of Birth: 09/23/1980  Date:  10/31/2019  Clinical Social Worker Initiating Note:  Lorain Fettes Date/Time: Initiated:  10/31/19/0902     Child's Name:  Sydney Griffin   Biological Parents:  Mother, Father(Sydney Griffin and Sydney Steven James Jr. DOB: 12/05/1979)   Need for Interpreter:  None   Reason for Referral:  Current Substance Use/Substance Use During Pregnancy (MOB on Suboxone throughout pregnancy)   Address:  [address] [place] [address]    Phone number:  [phone] (home)     Additional phone number:   Household Members/Support Persons (HM/SP):   Household Member/Support Person 1, Household Member/Support Person 2, Household Member/Support Person 3   HM/SP Name Relationship DOB or Age  HM/SP -1 Sydney Griffin Daughter 03/08/2003  HM/SP -2 Sydney Griffin Daughter (lives with their father Sydney Griffin) 03/28/2012  HM/SP -3 Sydney Griffin Daughter (lives with their father Sydney Griffin) 04/20/2013  HM/SP -4        HM/SP -5        HM/SP -6        HM/SP -7        HM/SP -8          Natural Supports (not living in the home):  Spouse/significant other, Children, Extended Family   Professional Supports: (Baby Love Nurse)   Employment: Unemployed   Type of Work:     Education:  9 to 11 years   Homebound arranged:    Financial Resources:  Medicaid   Other Resources:  Food Stamps , WIC   Cultural/Religious Considerations Which May Impact Care:    Strengths:  Ability to meet basic needs , Home prepared for child    Psychotropic Medications:         Pediatrician:       Pediatrician List:   [place]    [place]    [place]    [place]    [place]    [place]      Pediatrician Fax Number:    Risk Factors/Current Problems:  Substance Use    Cognitive State:  Alert ,  Distractible , Linear Thinking    Mood/Affect:  Calm , Comfortable , Interested , Relaxed    CSW Assessment:  CSW received consult for Suboxone use during pregnancy. CSW met with MOB to offer support and complete assessment.    MOB using the restroom and being tended to by nursing staff upon CSW's arrival. CSW waited for MOB to be back in bed and nursing staff to be finished before introducing self and completing assessment. CSW explained reason for consult to which MOB expressed understanding. MOB distracted during assessment and took numerous phone calls but was pleasant and answered questions appropriately. Per MOB, she currently lives with her oldest daughter and now infant in [place]. MOB stated her two other children live with their father, Sydney Griffin, but reported she still has custody and all of her rights. MOB reported she talks to them frequently and that they are happy for infant. MOB confirmed she receives both WIC and food stamps and is aware she would need to call to update them of her delivery. CSW inquired about MOB's mental health history and MOB denied having any and denied any history of PPD/A. CSW provided education regarding the baby blues period vs. perinatal mood disorders. CSW recommended self-evaluation during the postpartum time period using the New Mom Checklist from Postpartum   Progress and encouraged MOB to contact a medical professional if symptoms are noted at any time. MOB did not appear to be displaying any acute mental health symptoms and denied any current SI or HI. CSW unable to assess for DV and FOB was on the phone. MOB reported having a good support system consisting of FOB, her daughter and her cousin. MOB confirmed having all essential items for infant once discharged and stated infant would be sleeping in a bassinet once home. CSW provided review of Sudden Infant Death Syndrome (SIDS) precautions and safe sleeping habits.   CSW inquired about MOB's  substance use history and MOB reported being on Suboxone for a little over a year now. MOB stated she receives her Suboxone through [facility] and has graduated their program. MOB reported she participates in their outpatient therapy every 3-4 weeks. MOB feels medication is effective in managing her symptoms. CSW informed MOB of Hospital Drug Policy and explained UDS came back negative but that CDS was still pending and a CPS report would be made, if warranted. MOB denied any questions or concerns regarding policy and denied any previous CPS involvement.  CSW Plan/Description:  No Further Intervention Required/No Barriers to Discharge, Sudden Infant Death Syndrome (SIDS) Education, Perinatal Mood and Anxiety Disorder (PMADs) Education, Hospital Drug Screen Policy Information, CSW Will Continue to Monitor Umbilical Cord Tissue Drug Screen Results and Make Report if Warranted    Nicoli Nardozzi, LCSW 10/31/2019, 10:11 AM  

## 2019-11-01 MED ORDER — AMLODIPINE BESYLATE 10 MG PO TABS: 10.0000 mg | ORAL_TABLET | Freq: Every day | ORAL | 0 refills | Status: DC

## 2019-11-01 MED ORDER — ACETAMINOPHEN 325 MG PO TABS: 650.0000 mg | ORAL_TABLET | Freq: Four times a day (QID) | ORAL | 0 refills | Status: DC | PRN

## 2019-11-01 MED ORDER — IBUPROFEN 800 MG PO TABS: 800.0000 mg | ORAL_TABLET | Freq: Four times a day (QID) | ORAL | 0 refills | Status: DC

## 2019-11-01 MED ORDER — OXYCODONE HCL 5 MG PO TABS: 5.0000 mg | ORAL_TABLET | ORAL | 0 refills | Status: DC | PRN

## 2019-11-01 MED ORDER — SENNOSIDES-DOCUSATE SODIUM 8.6-50 MG PO TABS: 2.0000 | ORAL_TABLET | ORAL | 0 refills | Status: DC

## 2019-11-01 MED FILL — ACETAMINOPHEN 325 MG TABS: 325 | 4 days supply | Qty: 30 | Fill #0

## 2019-11-01 MED FILL — oxyCODONE HCL 5 MG TABS: 5 | 3 days supply | Qty: 25 | Fill #0

## 2019-11-01 MED FILL — IBUPROFEN 800 MG TAB: 800 | 7 days supply | Qty: 30 | Fill #0

## 2019-11-01 MED FILL — AMLODIPINE BESYLATE 10 MG T: 10 | 60 days supply | Qty: 60 | Fill #0

## 2019-11-01 MED FILL — SENEXON-S 8.6-50 MG TABS: 8.6-50 | 15 days supply | Qty: 30 | Fill #0

## 2019-11-01 NOTE — Lactation Note (Signed)
This note was copied from a baby's chart. Lactation Consultation Note  Patient Name: Sydney Griffin [id number] Date: 11/01/2019  P4, 44 hour term infant, NAS, DAT+,  -8% weight loss. Eat, sleep and console infant mom on suboxone.  Mom with hx: AMA, suboxone and CHTN in pregnancy Tools given: DEBP, 24 mm NS Mom is breast and formula feeding ( using gerber with iron). Mom did not latch infant on breast at night she only formula feeding, infant was given  30 to 50 mls of formula her choice,  nor did she use DEBP as advised by previous LC. Per RN, she repeatedly ask mom that she would assist her with latching infant at breast and mom declined help. Night LC entered room with Lab, mom was  non-verbal and not interested in latching infant to breast at this time, per mom, she  prefers LC services to come back later today she is tired.  Per mom, she does  want to continue with breastfeeding, she was given a NS yesterday and infant latched twice at breast, NS would not stay on breast so she would like help with latching infant with NS. Mom will continue to work towards latching infant at breast.       Maternal Data    Feeding Feeding Type: Bottle Fed - Formula Nipple Type: Slow - flow  LATCH Score                   Interventions    Lactation Tools Discussed/Used     Consult Status      Danelle Earthly 11/01/2019, 6:29 AM

## 2019-11-01 NOTE — Progress Notes (Addendum)
POSTPARTUM PROGRESS NOTE  Subjective: Sydney Griffin is a 39 y.o. [id number] s/p LTCS at [redacted]w[redacted]d.  She reports she doing well. No acute events overnight. She denies any problems with ambulating, voiding or po intake. Denies nausea or vomiting. She has passed flatus. Pain is moderately controlled.  Lochia is less than menses.  Objective: Blood pressure 136/75, pulse 71, temperature 98.2 F (36.8 C), temperature source Oral, resp. rate 17, height 5\' 9"  (1.753 m), weight 133.8 kg, last menstrual period 01/29/2019, SpO2 97 %, unknown if currently breastfeeding.  Physical Exam:  General: tired appearing but cooperative and no distress Chest: RRR, no respiratory distress Abdomen: soft, non-tender  Uterine Fundus: firm, appropriately tender Extremities: No calf swelling or tenderness  Recent Labs    10/30/19 1233 10/31/19 0520  HGB 10.8* 9.0*  HCT 32.7* 27.0*    Assessment/Plan: Sydney Griffin is a 39 y.o. 31 s/p LTCS at [redacted]w[redacted]d.  Routine Postpartum Care: Doing well, pain well-controlled.  -- Continue routine care, lactation support  -- Contraception: IUD -- Feeding: Breast - Continue Norvasc 10mg  for cHTN  Dispo: Plan for discharge late today vs tomorrow.  [redacted]w[redacted]d, DO Resident, [facility]  I saw and evaluated the patient. I agree with the findings and the plan of care as documented in the resident's note. DC home today per patient request. See DC summary.  Jackelyn Poling, MD [facility] Fellow, [facility] for [facility], [facility]

## 2019-11-03 ENCOUNTER — Telehealth: Payer: Self-pay

## 2019-11-03 NOTE — Telephone Encounter (Signed)
Pt called and reports that she is having headaches. Pt delivered via C section on Monday and was sent home with rx amlodipine 10 mg to take at bedtime, pt reports she has been taking it as directed. Advised pt to check BP at home this morning, BP reads 177/112. I advised pt to go to the hospital to be evaluated ASAP. Pt voices understanding and says she will go.

## 2019-11-06 DIAGNOSIS — F329 Major depressive disorder, single episode, unspecified: Secondary | ICD-10-CM | POA: Diagnosis not present

## 2019-11-08 ENCOUNTER — Other Ambulatory Visit: Payer: Self-pay

## 2019-11-08 ENCOUNTER — Ambulatory Visit: Payer: Medicaid Other | Admitting: *Deleted

## 2019-11-08 VITALS — BP 132/79 | HR 87 | Wt 278.6 lb

## 2019-11-08 DIAGNOSIS — Z8679 Personal history of other diseases of the circulatory system: Secondary | ICD-10-CM

## 2019-11-08 MED ORDER — CYCLOBENZAPRINE HCL 10 MG PO TABS: 10.0000 mg | ORAL_TABLET | Freq: Three times a day (TID) | ORAL | 0 refills | Status: DC | PRN

## 2019-11-08 NOTE — Progress Notes (Signed)
Subjective:     Sydney Griffin is a 39 y.o. female who presents to the clinic 1 weeks status post C/S for incison and BP check. Eating a regular diet without difficulty. Bowel movements are normal. Pain is controlled with current analgesics. Medications being used: ibuprofen (OTC).   Hypertension:  taking medications as instructed, no medication side effects noted, no TIA's, no chest pain on exertion, no dyspnea on exertion and noting swelling of ankles.  Pt does complain of some HA's- Ibuprofen gives little relief.    Review of Systems Pertinent items are noted in HPI.    Objective:    BP 132/79   Pulse 87   Wt 278 lb 9.6 oz (126.4 kg)   LMP 01/29/2019   BMI 41.14 kg/m  General:  alert, well appearing, and in no distress.   BP noted to be well controlled today in office.    Abdomen: soft, bowel sounds active, non-tender  Incision:   healing well, no drainage, no erythema, no hernia, no seroma, no swelling, no dehiscence, incision well approximated    Assessment:    Doing well postoperatively.  Blood Pressure Stable today.    Plan:   Current treatment plan is effective, no change in therapy.  Continue any current medications.  Recommend, per Dr Jolayne Panther,  Fioricet and Tylenol for HA's- if no relief contact office.  Fioricet ordered today. Wound care discussed. Follow up:  PP visit.   Reviewed chart with Dr Jolayne Panther    Malen Gauze, Stephannie Li, CMA

## 2019-11-08 NOTE — Progress Notes (Signed)
Patient seen and assessed by nursing staff during this encounter. I have reviewed the chart and agree with the documentation and plan.  Catalina Antigua, MD 11/08/2019 4:14 PM

## 2019-11-27 ENCOUNTER — Ambulatory Visit: Payer: Medicaid Other | Admitting: Obstetrics & Gynecology

## 2019-11-27 NOTE — Progress Notes (Deleted)
   Patient did not show up today for her scheduled appointment.   Rayshaun Needle, MD, FACOG Obstetrician & Gynecologist, [facility], [facility]  

## 2019-12-04 DIAGNOSIS — F329 Major depressive disorder, single episode, unspecified: Secondary | ICD-10-CM | POA: Diagnosis not present

## 2019-12-13 DIAGNOSIS — F329 Major depressive disorder, single episode, unspecified: Secondary | ICD-10-CM | POA: Diagnosis not present

## 2020-01-01 DIAGNOSIS — Z5181 Encounter for therapeutic drug level monitoring: Secondary | ICD-10-CM | POA: Diagnosis not present

## 2020-01-02 DIAGNOSIS — F329 Major depressive disorder, single episode, unspecified: Secondary | ICD-10-CM | POA: Diagnosis not present

## 2020-01-23 DIAGNOSIS — Z03818 Encounter for observation for suspected exposure to other biological agents ruled out: Secondary | ICD-10-CM | POA: Diagnosis not present

## 2020-01-29 DIAGNOSIS — Z03818 Encounter for observation for suspected exposure to other biological agents ruled out: Secondary | ICD-10-CM | POA: Diagnosis not present

## 2020-01-29 DIAGNOSIS — F329 Major depressive disorder, single episode, unspecified: Secondary | ICD-10-CM | POA: Diagnosis not present

## 2020-01-30 ENCOUNTER — Telehealth: Payer: Self-pay | Admitting: *Deleted

## 2020-01-30 NOTE — Telephone Encounter (Signed)
Spoke with pt today regarding her BP medication. Pt states she needs refill.  Pt is currently taking Triamterene-HCTZ 37.5-25mg .  Pt states she is now out and is having HA's and swelling.  Pt was made aware that she would need appt to follow up on BP since she did not have a PP exam after delivery.  Pt has not been seen by any PCP office. Pt is only being seen for her Suboxone. Pt advised to contact [facility] for new pt appt for management.   Pt was offered appt but states she does not have transportation. Pt made aware message could be sent to provider for recommendation and possible refill. Will also try to get information for travel assistance for pt.     Please advise on BP meds / ?refill.

## 2020-01-30 NOTE — Telephone Encounter (Signed)
It is okay to refill her BP meds. Thank you.

## 2020-01-31 ENCOUNTER — Other Ambulatory Visit: Payer: Self-pay | Admitting: *Deleted

## 2020-01-31 ENCOUNTER — Telehealth: Payer: Self-pay | Admitting: *Deleted

## 2020-01-31 MED ORDER — TRIAMTERENE-HCTZ 37.5-25 MG PO TABS: 1.0000 | ORAL_TABLET | Freq: Every day | ORAL | 1 refills | Status: DC

## 2020-01-31 NOTE — Progress Notes (Signed)
BP meds refilled per Dr Mayford Knife -  See phone note.

## 2020-01-31 NOTE — Telephone Encounter (Signed)
Spoke with pt today. She is asking if her nausea medication can be refilled.  States she is having some nausea after her recent deliver. Pt states she was taking Metoclopramide previously.  Advised pt message could be sent to provider for review.     Please advise.

## 2020-01-31 NOTE — Telephone Encounter (Signed)
Refill sent per approval. Pt aware.

## 2020-02-01 ENCOUNTER — Other Ambulatory Visit: Payer: Self-pay | Admitting: Obstetrics

## 2020-02-01 NOTE — Telephone Encounter (Signed)
She is not pregnant.  I'm not sure why she has nausea.

## 2020-02-02 ENCOUNTER — Ambulatory Visit (INDEPENDENT_AMBULATORY_CARE_PROVIDER_SITE_OTHER): Payer: Medicaid Other

## 2020-02-02 ENCOUNTER — Ambulatory Visit: Payer: Self-pay

## 2020-02-02 DIAGNOSIS — Z23 Encounter for immunization: Secondary | ICD-10-CM

## 2020-02-02 NOTE — Progress Notes (Signed)
   Covid-19 Vaccination Clinic  Name:  Sydney Griffin    MRN: [id number] DOB: 27-Jun-1981  02/02/2020  [name] was observed post Covid-19 immunization for 15 minutes without incident. She was provided with Vaccine Information Sheet and instruction to access the V-Safe system.   [name] was instructed to call 911 with any severe reactions post vaccine: Marland Kitchen Difficulty breathing  . Swelling of face and throat  . A fast heartbeat  . A bad rash all over body  . Dizziness and weakness   Immunizations Administered    Name Date Dose VIS Date Route   Moderna COVID-19 Vaccine 02/02/2020  9:31 AM 0.5 mL 08/2019 Intramuscular   Manufacturer: Moderna   Lot: [id number]   NDC: 88891-694-50

## 2020-02-03 DIAGNOSIS — Z03818 Encounter for observation for suspected exposure to other biological agents ruled out: Secondary | ICD-10-CM | POA: Diagnosis not present

## 2020-02-08 DIAGNOSIS — Z03818 Encounter for observation for suspected exposure to other biological agents ruled out: Secondary | ICD-10-CM | POA: Diagnosis not present

## 2020-02-15 DIAGNOSIS — Z03818 Encounter for observation for suspected exposure to other biological agents ruled out: Secondary | ICD-10-CM | POA: Diagnosis not present

## 2020-02-20 DIAGNOSIS — Z03818 Encounter for observation for suspected exposure to other biological agents ruled out: Secondary | ICD-10-CM | POA: Diagnosis not present

## 2020-02-21 DIAGNOSIS — F329 Major depressive disorder, single episode, unspecified: Secondary | ICD-10-CM | POA: Diagnosis not present

## 2020-02-23 DIAGNOSIS — Z03818 Encounter for observation for suspected exposure to other biological agents ruled out: Secondary | ICD-10-CM | POA: Diagnosis not present

## 2020-02-26 DIAGNOSIS — F329 Major depressive disorder, single episode, unspecified: Secondary | ICD-10-CM | POA: Diagnosis not present

## 2020-02-26 DIAGNOSIS — Z5181 Encounter for therapeutic drug level monitoring: Secondary | ICD-10-CM | POA: Diagnosis not present

## 2020-03-01 ENCOUNTER — Ambulatory Visit (INDEPENDENT_AMBULATORY_CARE_PROVIDER_SITE_OTHER): Payer: Medicaid Other

## 2020-03-01 DIAGNOSIS — Z23 Encounter for immunization: Secondary | ICD-10-CM

## 2020-03-01 NOTE — Progress Notes (Signed)
   Covid-19 Vaccination Clinic  Name:  Sydney Griffin    MRN: [id number] DOB: [birth year]  03/01/2020  Sydney Griffin was observed post Covid-19 immunization for 15 minutes without incident. She was provided with Vaccine Information Sheet and instruction to access the V-Safe system.   Sydney Griffin was instructed to call 911 with any severe reactions post vaccine: Marland Kitchen Difficulty breathing  . Swelling of face and throat  . A fast heartbeat  . A bad rash all over body  . Dizziness and weakness   Immunizations Administered    Name Date Dose VIS Date Route   Moderna COVID-19 Vaccine 03/01/2020 10:28 AM 0.5 mL 08/2019 Intramuscular   Manufacturer: Moderna   Lot: [id number]   NDC: 58527-782-42

## 2020-03-07 DIAGNOSIS — Z03818 Encounter for observation for suspected exposure to other biological agents ruled out: Secondary | ICD-10-CM | POA: Diagnosis not present

## 2020-03-21 DIAGNOSIS — Z03818 Encounter for observation for suspected exposure to other biological agents ruled out: Secondary | ICD-10-CM | POA: Diagnosis not present

## 2020-03-26 ENCOUNTER — Other Ambulatory Visit: Payer: Self-pay | Admitting: Obstetrics & Gynecology

## 2020-04-04 ENCOUNTER — Other Ambulatory Visit: Payer: Self-pay | Admitting: Obstetrics & Gynecology

## 2020-04-04 DIAGNOSIS — Z03818 Encounter for observation for suspected exposure to other biological agents ruled out: Secondary | ICD-10-CM | POA: Diagnosis not present

## 2020-04-18 DIAGNOSIS — Z03818 Encounter for observation for suspected exposure to other biological agents ruled out: Secondary | ICD-10-CM | POA: Diagnosis not present

## 2020-04-22 DIAGNOSIS — F329 Major depressive disorder, single episode, unspecified: Secondary | ICD-10-CM | POA: Diagnosis not present

## 2020-05-02 DIAGNOSIS — Z03818 Encounter for observation for suspected exposure to other biological agents ruled out: Secondary | ICD-10-CM | POA: Diagnosis not present

## 2020-05-08 DIAGNOSIS — F330 Major depressive disorder, recurrent, mild: Secondary | ICD-10-CM | POA: Diagnosis not present

## 2020-05-16 DIAGNOSIS — Z03818 Encounter for observation for suspected exposure to other biological agents ruled out: Secondary | ICD-10-CM | POA: Diagnosis not present

## 2020-05-20 DIAGNOSIS — Z5181 Encounter for therapeutic drug level monitoring: Secondary | ICD-10-CM | POA: Diagnosis not present

## 2020-05-28 DIAGNOSIS — R0989 Other specified symptoms and signs involving the circulatory and respiratory systems: Secondary | ICD-10-CM | POA: Diagnosis not present

## 2020-05-29 DIAGNOSIS — Z03818 Encounter for observation for suspected exposure to other biological agents ruled out: Secondary | ICD-10-CM | POA: Diagnosis not present

## 2020-06-10 DIAGNOSIS — Z5181 Encounter for therapeutic drug level monitoring: Secondary | ICD-10-CM | POA: Diagnosis not present

## 2020-06-12 DIAGNOSIS — Z03818 Encounter for observation for suspected exposure to other biological agents ruled out: Secondary | ICD-10-CM | POA: Diagnosis not present

## 2020-06-26 DIAGNOSIS — Z03818 Encounter for observation for suspected exposure to other biological agents ruled out: Secondary | ICD-10-CM | POA: Diagnosis not present

## 2020-07-02 DIAGNOSIS — F329 Major depressive disorder, single episode, unspecified: Secondary | ICD-10-CM | POA: Diagnosis not present

## 2020-07-10 DIAGNOSIS — Z03818 Encounter for observation for suspected exposure to other biological agents ruled out: Secondary | ICD-10-CM | POA: Diagnosis not present

## 2020-07-24 DIAGNOSIS — Z03818 Encounter for observation for suspected exposure to other biological agents ruled out: Secondary | ICD-10-CM | POA: Diagnosis not present

## 2020-08-05 DIAGNOSIS — F329 Major depressive disorder, single episode, unspecified: Secondary | ICD-10-CM | POA: Diagnosis not present

## 2020-08-05 DIAGNOSIS — Z5181 Encounter for therapeutic drug level monitoring: Secondary | ICD-10-CM | POA: Diagnosis not present

## 2020-08-07 DIAGNOSIS — Z03818 Encounter for observation for suspected exposure to other biological agents ruled out: Secondary | ICD-10-CM | POA: Diagnosis not present

## 2020-08-21 DIAGNOSIS — Z03818 Encounter for observation for suspected exposure to other biological agents ruled out: Secondary | ICD-10-CM | POA: Diagnosis not present

## 2020-09-04 DIAGNOSIS — Z03818 Encounter for observation for suspected exposure to other biological agents ruled out: Secondary | ICD-10-CM | POA: Diagnosis not present

## 2020-09-18 DIAGNOSIS — Z03818 Encounter for observation for suspected exposure to other biological agents ruled out: Secondary | ICD-10-CM | POA: Diagnosis not present

## 2020-09-18 DIAGNOSIS — F331 Major depressive disorder, recurrent, moderate: Secondary | ICD-10-CM | POA: Diagnosis not present

## 2020-09-30 DIAGNOSIS — F329 Major depressive disorder, single episode, unspecified: Secondary | ICD-10-CM | POA: Diagnosis not present

## 2020-10-02 DIAGNOSIS — Z03818 Encounter for observation for suspected exposure to other biological agents ruled out: Secondary | ICD-10-CM | POA: Diagnosis not present

## 2020-10-10 ENCOUNTER — Other Ambulatory Visit: Payer: Self-pay

## 2020-10-10 ENCOUNTER — Other Ambulatory Visit (HOSPITAL_COMMUNITY)
Admission: RE | Admit: 2020-10-10 | Discharge: 2020-10-10 | Disposition: A | Discharge status: Home / Self Care | Payer: Medicaid Other | Source: Ambulatory Visit | Attending: Obstetrics | Admitting: Obstetrics

## 2020-10-10 ENCOUNTER — Ambulatory Visit (INDEPENDENT_AMBULATORY_CARE_PROVIDER_SITE_OTHER): Payer: Medicaid Other

## 2020-10-10 VITALS — BP 127/77 | HR 86

## 2020-10-10 DIAGNOSIS — N898 Other specified noninflammatory disorders of vagina: Secondary | ICD-10-CM | POA: Insufficient documentation

## 2020-10-10 NOTE — Progress Notes (Addendum)
SUBJECTIVE:  40 y.o. female complains of malodorous vaginal discharge, itching, abdominal pain 7/10 for 1-2 month(s). Denies abnormal vaginal bleeding or fever. No UTI symptoms. Denies history of known exposure to STD.  Patient's last menstrual period was 09/24/2020 (approximate).  OBJECTIVE:  She appears well, afebrile. Urine dipstick: not done.  ASSESSMENT:  Vaginal Discharge  Vaginal Odor Abdominal Pain   PLAN:  GC, chlamydia, trichomonas, BVAG, CVAG probe sent to lab. Treatment: To be determined once lab results are received ROV prn if symptoms persist or worsen.  Patient was assessed and managed by nursing staff during this encounter. I have reviewed the chart and agree with the documentation and plan. I have also made any necessary editorial changes.  Coral Ceo, MD 10/12/2020 6:48 AM

## 2020-10-11 ENCOUNTER — Telehealth: Payer: Medicaid Other | Admitting: Nurse Practitioner

## 2020-10-11 DIAGNOSIS — B9689 Other specified bacterial agents as the cause of diseases classified elsewhere: Secondary | ICD-10-CM

## 2020-10-11 DIAGNOSIS — N760 Acute vaginitis: Secondary | ICD-10-CM | POA: Diagnosis not present

## 2020-10-11 DIAGNOSIS — F329 Major depressive disorder, single episode, unspecified: Secondary | ICD-10-CM | POA: Diagnosis not present

## 2020-10-11 MED ORDER — CLINDAMYCIN PHOSPHATE 100 MG VA SUPP: 100.0000 mg | Freq: Every day | VAGINAL | 0 refills | Status: DC

## 2020-10-11 MED ORDER — CLINDAMYCIN PHOSPHATE 2 % VA CREA: 1.0000 | TOPICAL_CREAM | Freq: Every day | VAGINAL | 0 refills | Status: DC

## 2020-10-11 NOTE — Addendum Note (Signed)
Addended by: Bennie Pierini on: 10/11/2020 06:38 PM   Modules accepted: Orders

## 2020-10-11 NOTE — Progress Notes (Signed)

## 2020-10-13 ENCOUNTER — Other Ambulatory Visit: Payer: Self-pay | Admitting: Obstetrics

## 2020-10-13 LAB — CERVICOVAGINAL ANCILLARY ONLY
Bacterial Vaginitis (gardnerella): POSITIVE — AB
Candida Glabrata: NEGATIVE
Candida Vaginitis: NEGATIVE
Chlamydia: NEGATIVE
Comment: NEGATIVE
Comment: NEGATIVE
Comment: NEGATIVE
Comment: NEGATIVE
Comment: NEGATIVE
Comment: NORMAL
Neisseria Gonorrhea: NEGATIVE
Trichomonas: NEGATIVE

## 2020-10-16 ENCOUNTER — Other Ambulatory Visit: Payer: Self-pay

## 2020-10-16 DIAGNOSIS — Z03818 Encounter for observation for suspected exposure to other biological agents ruled out: Secondary | ICD-10-CM | POA: Diagnosis not present

## 2020-10-16 MED ORDER — METRONIDAZOLE 1 % EX GEL: Freq: Every day | CUTANEOUS | 0 refills | Status: DC

## 2020-10-16 NOTE — Telephone Encounter (Signed)
Patient has BV and would like to use the Metrogel vaginally instead of the pills. She reports she can not remember to take pills. Metrogel has been called in and verified with the pharmacy for the correct dose.

## 2020-10-18 DIAGNOSIS — F329 Major depressive disorder, single episode, unspecified: Secondary | ICD-10-CM | POA: Diagnosis not present

## 2020-10-25 DIAGNOSIS — F329 Major depressive disorder, single episode, unspecified: Secondary | ICD-10-CM | POA: Diagnosis not present

## 2020-10-28 ENCOUNTER — Ambulatory Visit: Payer: Medicaid Other | Admitting: Obstetrics

## 2020-10-29 DIAGNOSIS — Z1152 Encounter for screening for COVID-19: Secondary | ICD-10-CM | POA: Diagnosis not present

## 2020-10-30 DIAGNOSIS — Z03818 Encounter for observation for suspected exposure to other biological agents ruled out: Secondary | ICD-10-CM | POA: Diagnosis not present

## 2020-11-01 DIAGNOSIS — F329 Major depressive disorder, single episode, unspecified: Secondary | ICD-10-CM | POA: Diagnosis not present

## 2020-11-04 DIAGNOSIS — Z1152 Encounter for screening for COVID-19: Secondary | ICD-10-CM | POA: Diagnosis not present

## 2020-11-12 ENCOUNTER — Ambulatory Visit (INDEPENDENT_AMBULATORY_CARE_PROVIDER_SITE_OTHER): Payer: Medicaid Other | Admitting: Obstetrics

## 2020-11-12 ENCOUNTER — Other Ambulatory Visit (HOSPITAL_COMMUNITY)
Admission: RE | Admit: 2020-11-12 | Discharge: 2020-11-12 | Disposition: A | Discharge status: Home / Self Care | Payer: Medicaid Other | Source: Ambulatory Visit | Attending: Obstetrics | Admitting: Obstetrics

## 2020-11-12 ENCOUNTER — Encounter: Payer: Self-pay | Admitting: Obstetrics

## 2020-11-12 ENCOUNTER — Other Ambulatory Visit: Payer: Self-pay

## 2020-11-12 VITALS — BP 122/78 | HR 84 | Ht 69.0 in | Wt 304.8 lb

## 2020-11-12 DIAGNOSIS — Z720 Tobacco use: Secondary | ICD-10-CM

## 2020-11-12 DIAGNOSIS — I10 Essential (primary) hypertension: Secondary | ICD-10-CM

## 2020-11-12 DIAGNOSIS — B9689 Other specified bacterial agents as the cause of diseases classified elsewhere: Secondary | ICD-10-CM

## 2020-11-12 DIAGNOSIS — N946 Dysmenorrhea, unspecified: Secondary | ICD-10-CM

## 2020-11-12 DIAGNOSIS — N898 Other specified noninflammatory disorders of vagina: Secondary | ICD-10-CM | POA: Insufficient documentation

## 2020-11-12 DIAGNOSIS — Z01419 Encounter for gynecological examination (general) (routine) without abnormal findings: Secondary | ICD-10-CM

## 2020-11-12 DIAGNOSIS — Z6841 Body Mass Index (BMI) 40.0 and over, adult: Secondary | ICD-10-CM

## 2020-11-12 DIAGNOSIS — Z113 Encounter for screening for infections with a predominantly sexual mode of transmission: Secondary | ICD-10-CM | POA: Diagnosis not present

## 2020-11-12 DIAGNOSIS — N760 Acute vaginitis: Secondary | ICD-10-CM

## 2020-11-12 MED ORDER — IBUPROFEN 800 MG PO TABS: 800.0000 mg | ORAL_TABLET | Freq: Three times a day (TID) | ORAL | 5 refills | Status: AC | PRN

## 2020-11-12 MED ORDER — METRONIDAZOLE 0.75 % VA GEL: 1.0000 | Freq: Two times a day (BID) | VAGINAL | 5 refills | Status: DC

## 2020-11-12 MED ORDER — TRIAMTERENE-HCTZ 37.5-25 MG PO TABS: 1.0000 | ORAL_TABLET | Freq: Every day | ORAL | 11 refills | Status: DC

## 2020-11-12 NOTE — Progress Notes (Signed)
Patient presents for AEX. Patient has no concerns today.  Last pap: 04/2019 Normal

## 2020-11-12 NOTE — Progress Notes (Signed)
Subjective:        Sydney Griffin is a 40 y.o. female here for a routine exam.  Current complaints: Malodorous vaginal discharge.    Personal health questionnaire:  Is patient [name], have a family history of breast and/or ovarian cancer: no Is there a family history of uterine cancer diagnosed at age < 60, gastrointestinal cancer, urinary tract cancer, family member who is a Personnel officer syndrome-associated carrier: no Is the patient overweight and hypertensive, family history of diabetes, personal history of gestational diabetes, preeclampsia or PCOS: yes Is patient over 61, have PCOS,  family history of premature CHD under age 25, diabetes, smoke, have hypertension or peripheral artery disease:  no At any time, has a partner hit, kicked or otherwise hurt or frightened you?: no Over the past 2 weeks, have you felt down, depressed or hopeless?: no Over the past 2 weeks, have you felt little interest or pleasure in doing things?:no   Gynecologic History No LMP recorded. Contraception: IUD Last Pap: 04-18-2019. Results were: normal Last mammogram: n/a. Results were: n/a  Obstetric History OB History  Gravida Para Term Preterm AB Living  6 4 4  0 2 4  SAB IAB Ectopic Multiple Live Births      2 0 4    # Outcome Date GA Lbr Len/2nd Weight Sex Delivery Anes PTL Lv  6 Term 10/30/19 [redacted]w[redacted]d  7 lb 12.3 oz (3.525 kg) F CS-LTranv Spinal  LIV  5 Term 04/20/13 [redacted]w[redacted]d  7 lb 9 oz (3.43 kg) F CS-LTranv Spinal  LIV  4 Term 03/28/12 [redacted]w[redacted]d  5 lb 10 oz (2.55 kg) F CS-LTranv Spinal  LIV  3 Term 03/08/03 [redacted]w[redacted]d  7 lb 2 oz (3.232 kg) F CS-LTranv EPI  LIV  2 Ectopic           1 Ectopic             Past Medical History:  Diagnosis Date  . Acid reflux   . Asthma May 2013   info from [facility] states asthma but pt denies and said she had bronchitis, used an [facility] while sick and has now lost the inhaler.  Hydrologist Dysrhythmia    rare palpitations  . Ectopic pregnancy    Had MTX  . Headache(784.0)    . Hypertension   . Menstrual bleeding problem   . 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 CESAREAN SECTION    . CESAREAN SECTION  03/28/2012   Procedure: CESAREAN SECTION;  Surgeon: 03/30/2012, MD;  Location: [facility];  Service: Gynecology;  Laterality: N/A;  . CESAREAN SECTION N/A 04/20/2013   Procedure: CESAREAN SECTION;  Surgeon: 04/22/2013, MD;  Location: [facility] ORS;  Service: Obstetrics;  Laterality: N/A;  Repeat Cesarean Section   . CESAREAN SECTION N/A 10/30/2019   Procedure: CESAREAN SECTION;  Surgeon: 11/01/2019, MD;  Location: [facility] LD ORS;  Service: Obstetrics;  Laterality: N/A;  . DENTAL SURGERY    . DILATION AND CURETTAGE OF UTERUS    . LAPAROSCOPY       Current Outpatient Medications:  .  buprenorphine-naloxone (SUBOXONE) 8-2 mg SUBL SL tablet, Place 1 tablet under the tongue daily. Pt states she takes anywhere from 1-3  daily, Disp: , Rfl:  .  metroNIDAZOLE (METROGEL VAGINAL) 0.75 % vaginal gel, Place 1 Applicatorful vaginally 2 (two) times daily., Disp: 70 g, Rfl: 5 .  acetaminophen (TYLENOL) 325 MG tablet,  Take 2 tablets (650 mg total) by mouth every 6 (six) hours as needed for mild pain (temperature > 101.5.)., Disp: 30 tablet, Rfl: 0 .  ibuprofen (ADVIL) 800 MG tablet, Take 1 tablet (800 mg total) by mouth every 8 (eight) hours as needed for cramping., Disp: 30 tablet, Rfl: 5 .  triamterene-hydrochlorothiazide (MAXZIDE-25) 37.5-25 MG tablet, Take 1 tablet by mouth daily., Disp: 30 tablet, Rfl: 11 Allergies  Allergen Reactions  . Aspirin Shortness Of Breath  . Dilaudid [Hydromorphone Hcl] Other (See Comments)    Pt says medication causes her to shake uncontrollably.  . Ibuprofen Other (See Comments)    Gi upset  . Tramadol Nausea Only and Other (See Comments)    Causes headache.  . Vicodin [Hydrocodone-Acetaminophen] Other (See Comments)    Causes  headache.    Social History   Tobacco Use  . Smoking status: Current Every Day Smoker    Packs/day: 1.00    Years: 14.00    Pack years: 14.00    Types: Cigarettes  . Smokeless tobacco: Never Used  Substance Use Topics  . Alcohol use: No    Alcohol/week: 0.0 standard drinks    Family History  Problem Relation Age of Onset  . Hypertension Mother   . Diabetes Mother   . Diabetes Father   . Hyperlipidemia Father   . Hypertension Brother   . Hypertension Sister   . Anesthesia problems Neg Hx   . Other Neg Hx       Review of Systems  Constitutional: negative for fatigue and weight loss Respiratory: negative for cough and wheezing Cardiovascular: negative for chest pain, fatigue and palpitations Gastrointestinal: negative for abdominal pain and change in bowel habits Musculoskeletal:negative for myalgias Neurological: negative for gait problems and tremors Behavioral/Psych: negative for abusive relationship, depression Endocrine: negative for temperature intolerance    Genitourinary:negative for abnormal menstrual periods, genital lesions, hot flashes, sexual problems.  Positive forvaginal discharge Integument/breast: negative for breast lump, breast tenderness, nipple discharge and skin lesion(s)    Objective:       BP 122/78   Pulse 84   Ht 5\' 9"  (1.753 m)   Wt (!) 304 lb 12.8 oz (138.3 kg)   BMI 45.01 kg/m  General:   alert and no distress  Skin:   no rash or abnormalities  Lungs:   clear to auscultation bilaterally  Heart:   regular rate and rhythm, S1, S2 normal, no murmur, click, rub or gallop  Breasts:   normal without suspicious masses, skin or nipple changes or axillary nodes  Abdomen:  normal findings: no organomegaly, soft, non-tender and no hernia  Pelvis:  External genitalia: normal general appearance Urinary system: urethral meatus normal and bladder without fullness, nontender Vaginal: normal without tenderness, induration or masses Cervix: normal  appearance Adnexa: normal bimanual exam Uterus: anteverted and non-tender, normal size   Lab Review Urine pregnancy test Labs reviewed yes Radiologic studies reviewed no  50% of 20 min visit spent on counseling and coordination of care.   Assessment:     1. Encounter for gynecological examination with Papanicolaou smear of cervix Rx: - Cytology - PAP( [place])  2. Vaginal discharge Rx: - Cervicovaginal ancillary only( [place])  3. Screening for STD (sexually transmitted disease) Rx: - HIV Antibody (routine testing w rflx) - Hepatitis B surface antigen - RPR - Hepatitis C antibody  4. Class 3 severe obesity due to excess calories without serious comorbidity with body mass index (BMI) of 45.0 to 49.9 in  adult [facility]) - program of caloric reduction, exercise and behavioral modification recommended  5. Tobacco abuse - cessation with the aid of medication and behavioral modification recommended  6. BV (bacterial vaginosis) Rx: - metroNIDAZOLE (METROGEL VAGINAL) 0.75 % vaginal gel; Place 1 Applicatorful vaginally 2 (two) times daily.  Dispense: 70 g; Refill: 5  7. HTN (hypertension), benign Rx: - triamterene-hydrochlorothiazide (MAXZIDE-25) 37.5-25 MG tablet; Take 1 tablet by mouth daily.  Dispense: 30 tablet; Refill: 11  8. Dysmenorrhea Rx: - ibuprofen (ADVIL) 800 MG tablet; Take 1 tablet (800 mg total) by mouth every 8 (eight) hours as needed for cramping.  Dispense: 30 tablet; Refill: 5    Plan:    Education reviewed: calcium supplements, depression evaluation, low fat, low cholesterol diet, safe sex/STD prevention, self breast exams, smoking cessation and weight bearing exercise. Contraception: IUD. Follow up in: 1 year.   Meds ordered this encounter  Medications  . triamterene-hydrochlorothiazide (MAXZIDE-25) 37.5-25 MG tablet    Sig: Take 1 tablet by mouth daily.    Dispense:  30 tablet    Refill:  11  . metroNIDAZOLE (METROGEL VAGINAL) 0.75 % vaginal  gel    Sig: Place 1 Applicatorful vaginally 2 (two) times daily.    Dispense:  70 g    Refill:  5  . ibuprofen (ADVIL) 800 MG tablet    Sig: Take 1 tablet (800 mg total) by mouth every 8 (eight) hours as needed for cramping.    Dispense:  30 tablet    Refill:  5   Orders Placed This Encounter  Procedures  . HIV Antibody (routine testing w rflx)  . Hepatitis B surface antigen  . RPR  . Hepatitis C antibody    Brock Bad, MD 11/12/2020 4:07 PM

## 2020-11-13 LAB — HIV ANTIBODY (ROUTINE TESTING W REFLEX): HIV Screen 4th Generation wRfx: NONREACTIVE

## 2020-11-13 LAB — CYTOLOGY - PAP
Comment: NEGATIVE
Diagnosis: NEGATIVE
High risk HPV: NEGATIVE

## 2020-11-13 LAB — CERVICOVAGINAL ANCILLARY ONLY
Bacterial Vaginitis (gardnerella): NEGATIVE
Candida Glabrata: NEGATIVE
Candida Vaginitis: NEGATIVE
Chlamydia: NEGATIVE
Comment: NEGATIVE
Comment: NEGATIVE
Comment: NEGATIVE
Comment: NEGATIVE
Comment: NEGATIVE
Comment: NORMAL
Neisseria Gonorrhea: NEGATIVE
Trichomonas: NEGATIVE

## 2020-11-13 LAB — HEPATITIS C ANTIBODY: Hep C Virus Ab: <0.1 s/co ratio (ref 0.0–0.9)

## 2020-11-13 LAB — HEPATITIS B SURFACE ANTIGEN: Hepatitis B Surface Ag: NEGATIVE

## 2020-11-13 LAB — RPR: RPR Ser Ql: NONREACTIVE

## 2020-11-18 DIAGNOSIS — F329 Major depressive disorder, single episode, unspecified: Secondary | ICD-10-CM | POA: Diagnosis not present

## 2020-11-18 DIAGNOSIS — Z1152 Encounter for screening for COVID-19: Secondary | ICD-10-CM | POA: Diagnosis not present

## 2020-11-19 DIAGNOSIS — Z03818 Encounter for observation for suspected exposure to other biological agents ruled out: Secondary | ICD-10-CM | POA: Diagnosis not present

## 2020-11-21 DIAGNOSIS — Z1152 Encounter for screening for COVID-19: Secondary | ICD-10-CM | POA: Diagnosis not present

## 2020-11-25 DIAGNOSIS — Z1152 Encounter for screening for COVID-19: Secondary | ICD-10-CM | POA: Diagnosis not present

## 2020-11-25 DIAGNOSIS — F329 Major depressive disorder, single episode, unspecified: Secondary | ICD-10-CM | POA: Diagnosis not present

## 2020-11-26 DIAGNOSIS — Z03818 Encounter for observation for suspected exposure to other biological agents ruled out: Secondary | ICD-10-CM | POA: Diagnosis not present

## 2020-12-03 DIAGNOSIS — Z03818 Encounter for observation for suspected exposure to other biological agents ruled out: Secondary | ICD-10-CM | POA: Diagnosis not present

## 2020-12-06 DIAGNOSIS — F329 Major depressive disorder, single episode, unspecified: Secondary | ICD-10-CM | POA: Diagnosis not present

## 2020-12-10 DIAGNOSIS — Z03818 Encounter for observation for suspected exposure to other biological agents ruled out: Secondary | ICD-10-CM | POA: Diagnosis not present

## 2020-12-17 DIAGNOSIS — Z03818 Encounter for observation for suspected exposure to other biological agents ruled out: Secondary | ICD-10-CM | POA: Diagnosis not present

## 2020-12-24 DIAGNOSIS — Z03818 Encounter for observation for suspected exposure to other biological agents ruled out: Secondary | ICD-10-CM | POA: Diagnosis not present

## 2020-12-26 DIAGNOSIS — F329 Major depressive disorder, single episode, unspecified: Secondary | ICD-10-CM | POA: Diagnosis not present

## 2020-12-31 DIAGNOSIS — Z03818 Encounter for observation for suspected exposure to other biological agents ruled out: Secondary | ICD-10-CM | POA: Diagnosis not present

## 2021-01-07 DIAGNOSIS — Z03818 Encounter for observation for suspected exposure to other biological agents ruled out: Secondary | ICD-10-CM | POA: Diagnosis not present

## 2021-01-14 DIAGNOSIS — Z03818 Encounter for observation for suspected exposure to other biological agents ruled out: Secondary | ICD-10-CM | POA: Diagnosis not present

## 2021-01-21 DIAGNOSIS — Z03818 Encounter for observation for suspected exposure to other biological agents ruled out: Secondary | ICD-10-CM | POA: Diagnosis not present

## 2021-01-28 DIAGNOSIS — Z03818 Encounter for observation for suspected exposure to other biological agents ruled out: Secondary | ICD-10-CM | POA: Diagnosis not present

## 2021-01-30 DIAGNOSIS — Z20822 Contact with and (suspected) exposure to covid-19: Secondary | ICD-10-CM | POA: Diagnosis not present

## 2021-02-03 DIAGNOSIS — Z20822 Contact with and (suspected) exposure to covid-19: Secondary | ICD-10-CM | POA: Diagnosis not present

## 2021-02-04 DIAGNOSIS — Z03818 Encounter for observation for suspected exposure to other biological agents ruled out: Secondary | ICD-10-CM | POA: Diagnosis not present

## 2021-02-06 DIAGNOSIS — Z20822 Contact with and (suspected) exposure to covid-19: Secondary | ICD-10-CM | POA: Diagnosis not present

## 2021-02-10 DIAGNOSIS — Z20822 Contact with and (suspected) exposure to covid-19: Secondary | ICD-10-CM | POA: Diagnosis not present

## 2021-02-11 DIAGNOSIS — Z03818 Encounter for observation for suspected exposure to other biological agents ruled out: Secondary | ICD-10-CM | POA: Diagnosis not present

## 2021-02-13 DIAGNOSIS — Z20822 Contact with and (suspected) exposure to covid-19: Secondary | ICD-10-CM | POA: Diagnosis not present

## 2021-02-18 DIAGNOSIS — Z20822 Contact with and (suspected) exposure to covid-19: Secondary | ICD-10-CM | POA: Diagnosis not present

## 2021-02-18 DIAGNOSIS — Z03818 Encounter for observation for suspected exposure to other biological agents ruled out: Secondary | ICD-10-CM | POA: Diagnosis not present

## 2021-02-24 DIAGNOSIS — Z20822 Contact with and (suspected) exposure to covid-19: Secondary | ICD-10-CM | POA: Diagnosis not present

## 2021-02-25 DIAGNOSIS — Z03818 Encounter for observation for suspected exposure to other biological agents ruled out: Secondary | ICD-10-CM | POA: Diagnosis not present

## 2021-02-27 DIAGNOSIS — Z20822 Contact with and (suspected) exposure to covid-19: Secondary | ICD-10-CM | POA: Diagnosis not present

## 2021-03-03 DIAGNOSIS — Z20822 Contact with and (suspected) exposure to covid-19: Secondary | ICD-10-CM | POA: Diagnosis not present

## 2021-03-04 DIAGNOSIS — Z03818 Encounter for observation for suspected exposure to other biological agents ruled out: Secondary | ICD-10-CM | POA: Diagnosis not present

## 2021-03-06 DIAGNOSIS — Z20822 Contact with and (suspected) exposure to covid-19: Secondary | ICD-10-CM | POA: Diagnosis not present

## 2021-03-10 DIAGNOSIS — Z20822 Contact with and (suspected) exposure to covid-19: Secondary | ICD-10-CM | POA: Diagnosis not present

## 2021-03-11 DIAGNOSIS — Z03818 Encounter for observation for suspected exposure to other biological agents ruled out: Secondary | ICD-10-CM | POA: Diagnosis not present

## 2021-03-13 DIAGNOSIS — Z20822 Contact with and (suspected) exposure to covid-19: Secondary | ICD-10-CM | POA: Diagnosis not present

## 2021-03-17 DIAGNOSIS — Z20822 Contact with and (suspected) exposure to covid-19: Secondary | ICD-10-CM | POA: Diagnosis not present

## 2021-03-17 DIAGNOSIS — Z03818 Encounter for observation for suspected exposure to other biological agents ruled out: Secondary | ICD-10-CM | POA: Diagnosis not present

## 2021-03-18 DIAGNOSIS — Z03818 Encounter for observation for suspected exposure to other biological agents ruled out: Secondary | ICD-10-CM | POA: Diagnosis not present

## 2021-03-20 DIAGNOSIS — Z20822 Contact with and (suspected) exposure to covid-19: Secondary | ICD-10-CM | POA: Diagnosis not present

## 2021-03-25 DIAGNOSIS — Z03818 Encounter for observation for suspected exposure to other biological agents ruled out: Secondary | ICD-10-CM | POA: Diagnosis not present

## 2021-04-01 DIAGNOSIS — Z03818 Encounter for observation for suspected exposure to other biological agents ruled out: Secondary | ICD-10-CM | POA: Diagnosis not present

## 2021-04-02 DIAGNOSIS — Z20822 Contact with and (suspected) exposure to covid-19: Secondary | ICD-10-CM | POA: Diagnosis not present

## 2021-04-07 DIAGNOSIS — Z20822 Contact with and (suspected) exposure to covid-19: Secondary | ICD-10-CM | POA: Diagnosis not present

## 2021-04-08 DIAGNOSIS — Z03818 Encounter for observation for suspected exposure to other biological agents ruled out: Secondary | ICD-10-CM | POA: Diagnosis not present

## 2021-04-11 DIAGNOSIS — Z20822 Contact with and (suspected) exposure to covid-19: Secondary | ICD-10-CM | POA: Diagnosis not present

## 2021-04-15 DIAGNOSIS — Z20822 Contact with and (suspected) exposure to covid-19: Secondary | ICD-10-CM | POA: Diagnosis not present

## 2021-04-15 DIAGNOSIS — Z03818 Encounter for observation for suspected exposure to other biological agents ruled out: Secondary | ICD-10-CM | POA: Diagnosis not present

## 2021-04-22 DIAGNOSIS — Z03818 Encounter for observation for suspected exposure to other biological agents ruled out: Secondary | ICD-10-CM | POA: Diagnosis not present

## 2021-04-24 DIAGNOSIS — Z1152 Encounter for screening for COVID-19: Secondary | ICD-10-CM | POA: Diagnosis not present

## 2021-04-29 DIAGNOSIS — Z03818 Encounter for observation for suspected exposure to other biological agents ruled out: Secondary | ICD-10-CM | POA: Diagnosis not present

## 2021-05-03 DIAGNOSIS — Z1152 Encounter for screening for COVID-19: Secondary | ICD-10-CM | POA: Diagnosis not present

## 2021-05-06 DIAGNOSIS — Z03818 Encounter for observation for suspected exposure to other biological agents ruled out: Secondary | ICD-10-CM | POA: Diagnosis not present

## 2021-05-09 DIAGNOSIS — F329 Major depressive disorder, single episode, unspecified: Secondary | ICD-10-CM | POA: Diagnosis not present

## 2021-05-09 DIAGNOSIS — Z1152 Encounter for screening for COVID-19: Secondary | ICD-10-CM | POA: Diagnosis not present

## 2021-05-16 DIAGNOSIS — F329 Major depressive disorder, single episode, unspecified: Secondary | ICD-10-CM | POA: Diagnosis not present

## 2021-05-23 DIAGNOSIS — F329 Major depressive disorder, single episode, unspecified: Secondary | ICD-10-CM | POA: Diagnosis not present

## 2021-05-25 DIAGNOSIS — Z1152 Encounter for screening for COVID-19: Secondary | ICD-10-CM | POA: Diagnosis not present

## 2021-05-30 DIAGNOSIS — F329 Major depressive disorder, single episode, unspecified: Secondary | ICD-10-CM | POA: Diagnosis not present

## 2021-06-04 DIAGNOSIS — Z1152 Encounter for screening for COVID-19: Secondary | ICD-10-CM | POA: Diagnosis not present

## 2021-06-04 DIAGNOSIS — F329 Major depressive disorder, single episode, unspecified: Secondary | ICD-10-CM | POA: Diagnosis not present

## 2021-06-06 DIAGNOSIS — F329 Major depressive disorder, single episode, unspecified: Secondary | ICD-10-CM | POA: Diagnosis not present

## 2021-06-13 DIAGNOSIS — F329 Major depressive disorder, single episode, unspecified: Secondary | ICD-10-CM | POA: Diagnosis not present

## 2021-06-13 DIAGNOSIS — Z1152 Encounter for screening for COVID-19: Secondary | ICD-10-CM | POA: Diagnosis not present

## 2021-06-20 IMAGING — US OBSTETRIC <14 WK ULTRASOUND
1 series · 15 of 28 positions shown · non-contrast
Comparison: Ultrasound October 18, 2017.

CLINICAL DATA: First trimester of pregnancy, lower abdominal pain.

EXAM:
OBSTETRIC <14 WK US AND TRANSVAGINAL OB US
TECHNIQUE: Both transabdominal and transvaginal ultrasound examinations were
performed for complete evaluation of the gestation as well as the
maternal uterus, adnexal regions, and pelvic cul-de-sac.
Transvaginal technique was performed to assess early pregnancy.

[Series 1: obstetric <14 wk ultrasound · 15 of 70 slices shown]
[im 1/70]
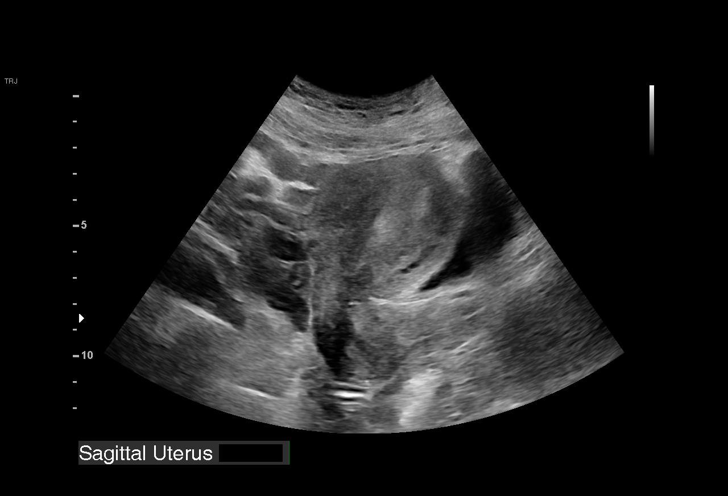
[im 6/70]
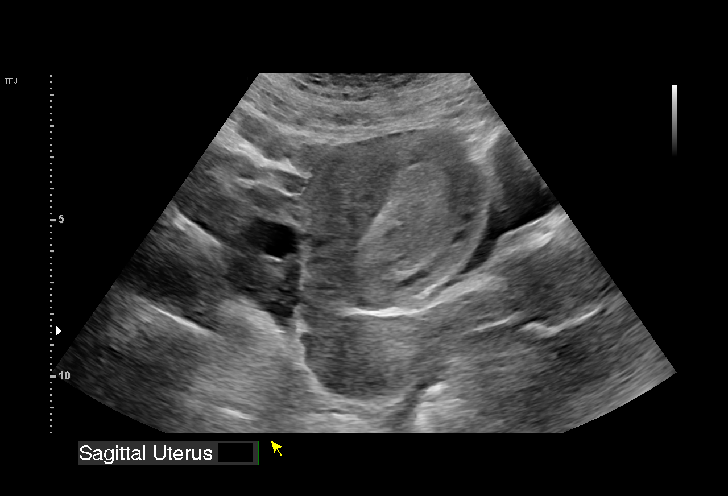
[im 11/70]
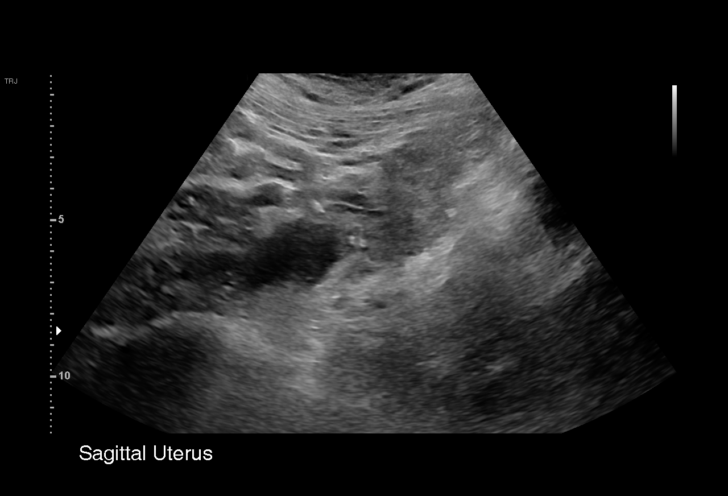
[im 16/70]
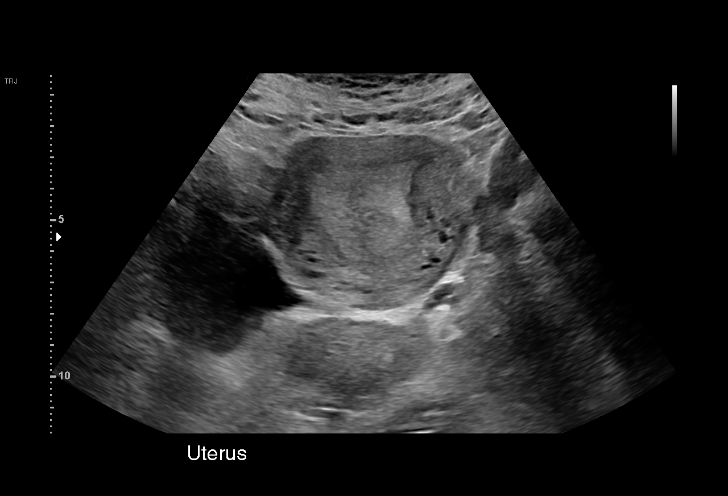
[im 21/70]
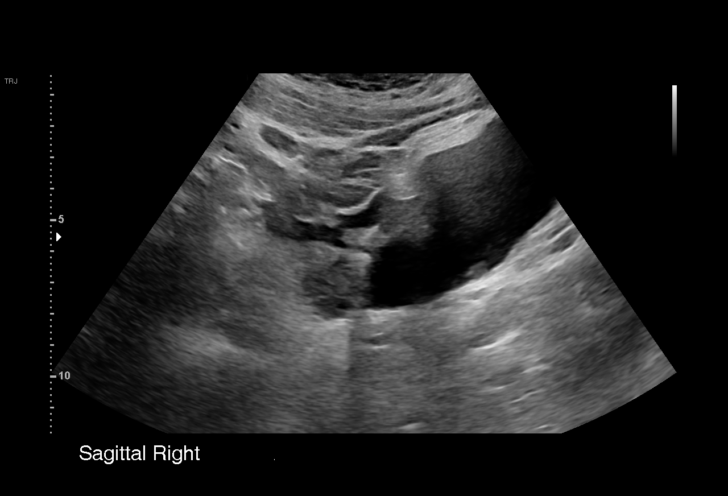
[im 26/70]
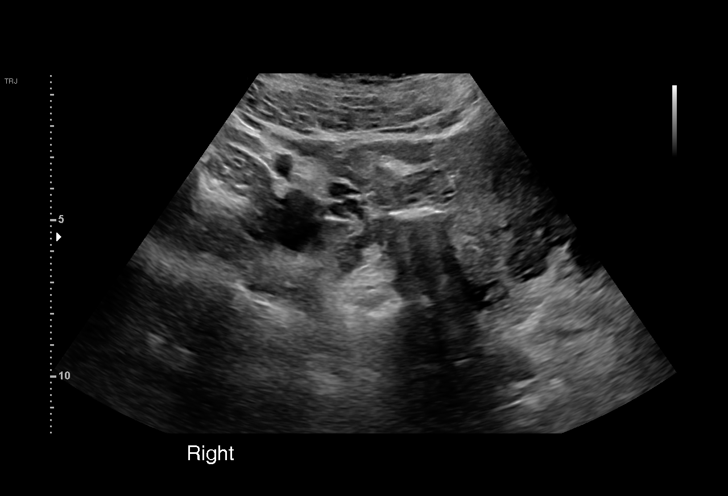
[im 31/70]
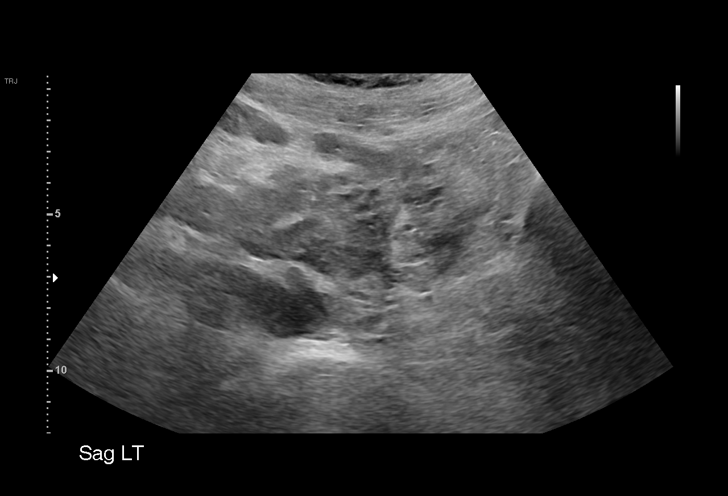
[im 36/70]
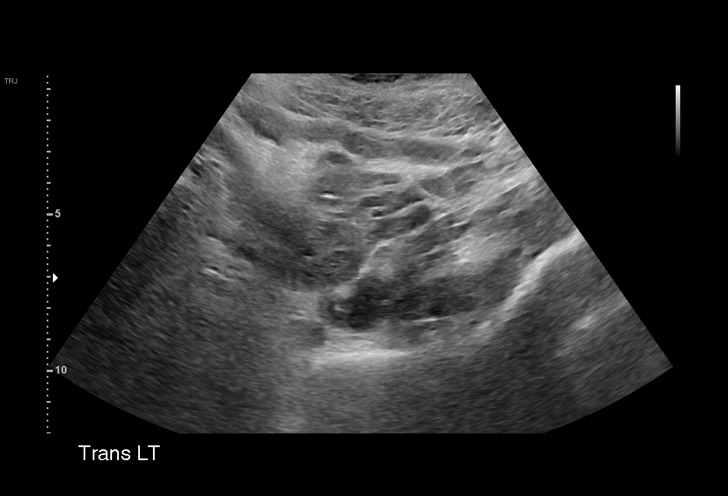
[im 39/70]
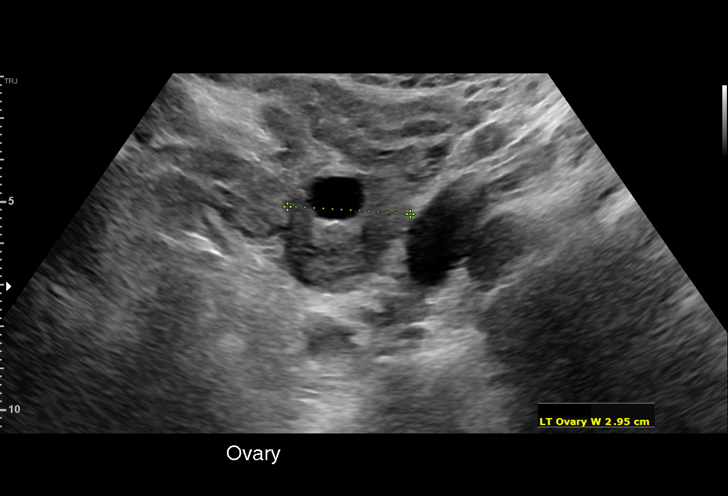
[im 44/70]
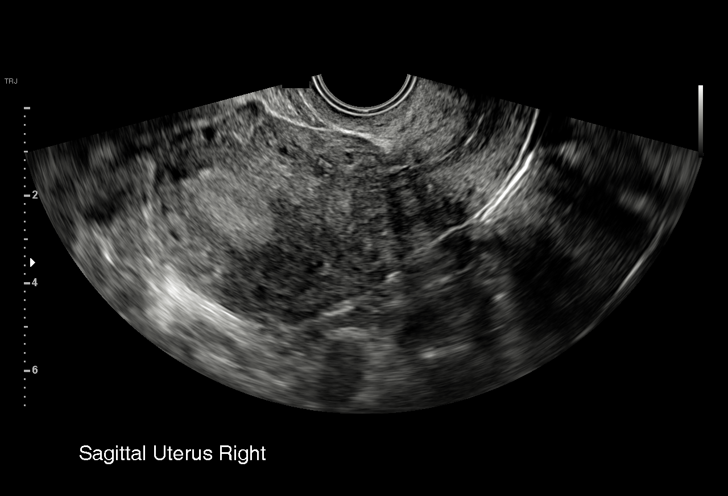
[im 49/70]
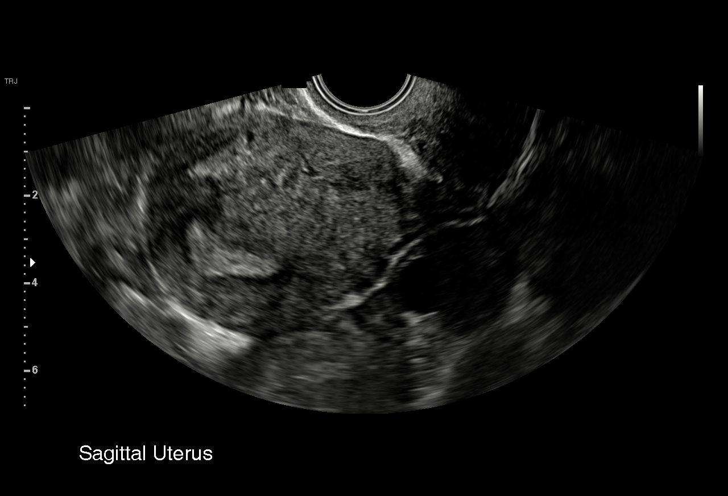
[im 54/70]
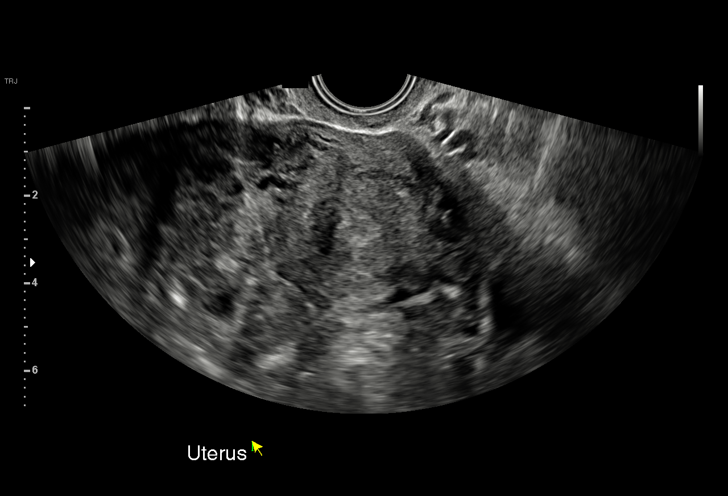
[im 59/70]
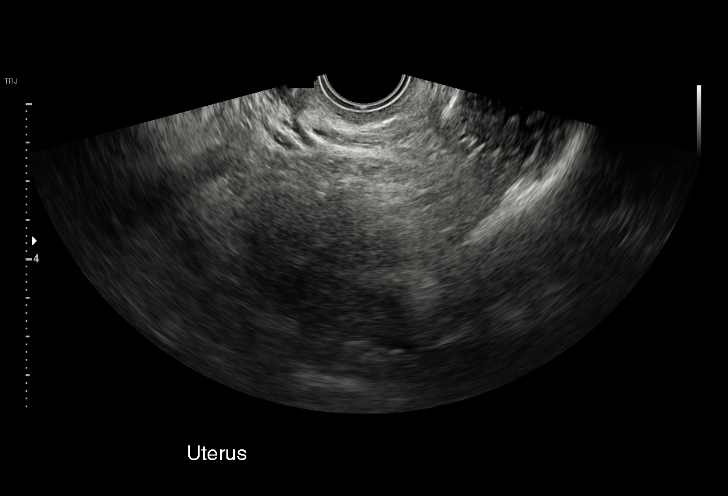
[im 64/70]
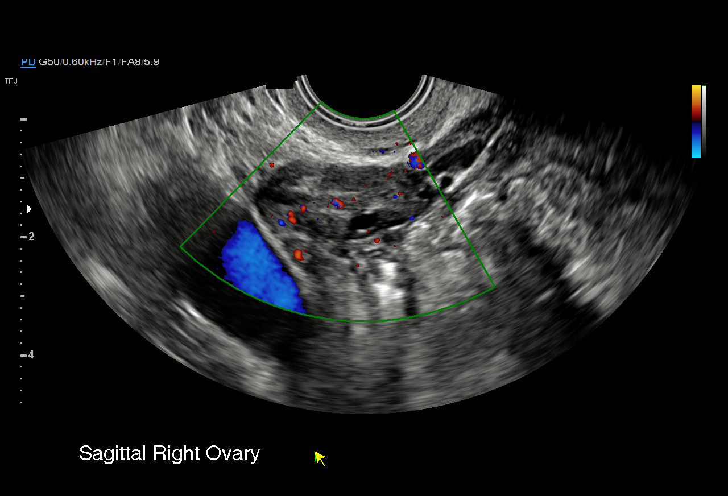
[im 70/70]
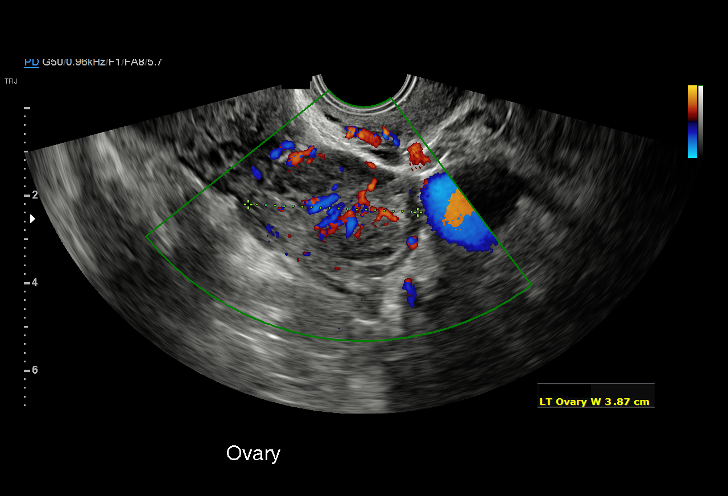

[15 of 28 positions shown; findings below may reference images not displayed]

FINDINGS: Intrauterine gestational sac: Possible intrauterine gestational sac
is noted.

Yolk sac:  Not visualized.

Embryo:  Not visualized.

Cardiac Activity: Not visualized

MSD: 2.4 mm   4 w   6 d

Subchorionic hemorrhage:  None visualized.

Maternal uterus/adnexae: Probable corpus luteum cyst seen in left
ovary. Right ovary appears normal. Trace free fluid is noted which
most likely is physiologic.
IMPRESSION: Probable early intrauterine gestational sac, but no yolk sac, fetal
pole, or cardiac activity yet visualized. Recommend follow-up
quantitative B-HCG levels and follow-up US in 14 days to assess
viability. This recommendation follows SRU consensus guidelines:
Diagnostic Criteria for Nonviable Pregnancy Early in the First
Trimester. N Engl J Med 5809; [DATE].

## 2021-07-02 DIAGNOSIS — F329 Major depressive disorder, single episode, unspecified: Secondary | ICD-10-CM | POA: Diagnosis not present

## 2021-07-16 DIAGNOSIS — F329 Major depressive disorder, single episode, unspecified: Secondary | ICD-10-CM | POA: Diagnosis not present

## 2021-07-23 DIAGNOSIS — F329 Major depressive disorder, single episode, unspecified: Secondary | ICD-10-CM | POA: Diagnosis not present

## 2021-08-02 DIAGNOSIS — F329 Major depressive disorder, single episode, unspecified: Secondary | ICD-10-CM | POA: Diagnosis not present

## 2021-08-07 DIAGNOSIS — Z1152 Encounter for screening for COVID-19: Secondary | ICD-10-CM | POA: Diagnosis not present

## 2021-08-11 DIAGNOSIS — F329 Major depressive disorder, single episode, unspecified: Secondary | ICD-10-CM | POA: Diagnosis not present

## 2021-08-18 DIAGNOSIS — F329 Major depressive disorder, single episode, unspecified: Secondary | ICD-10-CM | POA: Diagnosis not present

## 2021-08-30 DIAGNOSIS — Z1152 Encounter for screening for COVID-19: Secondary | ICD-10-CM | POA: Diagnosis not present

## 2021-09-01 DIAGNOSIS — F320 Major depressive disorder, single episode, mild: Secondary | ICD-10-CM | POA: Diagnosis not present

## 2021-10-01 DIAGNOSIS — F320 Major depressive disorder, single episode, mild: Secondary | ICD-10-CM | POA: Diagnosis not present

## 2021-10-08 DIAGNOSIS — F320 Major depressive disorder, single episode, mild: Secondary | ICD-10-CM | POA: Diagnosis not present

## 2021-10-15 DIAGNOSIS — F320 Major depressive disorder, single episode, mild: Secondary | ICD-10-CM | POA: Diagnosis not present

## 2021-10-22 DIAGNOSIS — F320 Major depressive disorder, single episode, mild: Secondary | ICD-10-CM | POA: Diagnosis not present

## 2021-10-26 DIAGNOSIS — Z20822 Contact with and (suspected) exposure to covid-19: Secondary | ICD-10-CM | POA: Diagnosis not present

## 2021-10-27 DIAGNOSIS — F320 Major depressive disorder, single episode, mild: Secondary | ICD-10-CM | POA: Diagnosis not present

## 2021-10-29 DIAGNOSIS — F320 Major depressive disorder, single episode, mild: Secondary | ICD-10-CM | POA: Diagnosis not present

## 2021-11-02 DIAGNOSIS — Z20822 Contact with and (suspected) exposure to covid-19: Secondary | ICD-10-CM | POA: Diagnosis not present

## 2021-11-05 DIAGNOSIS — F320 Major depressive disorder, single episode, mild: Secondary | ICD-10-CM | POA: Diagnosis not present

## 2021-11-12 DIAGNOSIS — F320 Major depressive disorder, single episode, mild: Secondary | ICD-10-CM | POA: Diagnosis not present

## 2021-11-19 DIAGNOSIS — F320 Major depressive disorder, single episode, mild: Secondary | ICD-10-CM | POA: Diagnosis not present

## 2021-11-22 DIAGNOSIS — F320 Major depressive disorder, single episode, mild: Secondary | ICD-10-CM | POA: Diagnosis not present

## 2021-11-24 DIAGNOSIS — F320 Major depressive disorder, single episode, mild: Secondary | ICD-10-CM | POA: Diagnosis not present

## 2021-11-26 DIAGNOSIS — F320 Major depressive disorder, single episode, mild: Secondary | ICD-10-CM | POA: Diagnosis not present

## 2021-12-03 DIAGNOSIS — F320 Major depressive disorder, single episode, mild: Secondary | ICD-10-CM | POA: Diagnosis not present

## 2021-12-05 DIAGNOSIS — F320 Major depressive disorder, single episode, mild: Secondary | ICD-10-CM | POA: Diagnosis not present

## 2021-12-05 DIAGNOSIS — Z1152 Encounter for screening for COVID-19: Secondary | ICD-10-CM | POA: Diagnosis not present

## 2021-12-07 DIAGNOSIS — F320 Major depressive disorder, single episode, mild: Secondary | ICD-10-CM | POA: Diagnosis not present

## 2021-12-12 DIAGNOSIS — F320 Major depressive disorder, single episode, mild: Secondary | ICD-10-CM | POA: Diagnosis not present

## 2021-12-13 DIAGNOSIS — Z1152 Encounter for screening for COVID-19: Secondary | ICD-10-CM | POA: Diagnosis not present

## 2021-12-19 DIAGNOSIS — F320 Major depressive disorder, single episode, mild: Secondary | ICD-10-CM | POA: Diagnosis not present

## 2021-12-21 DIAGNOSIS — F320 Major depressive disorder, single episode, mild: Secondary | ICD-10-CM | POA: Diagnosis not present

## 2021-12-21 DIAGNOSIS — Z1152 Encounter for screening for COVID-19: Secondary | ICD-10-CM | POA: Diagnosis not present

## 2021-12-22 DIAGNOSIS — F320 Major depressive disorder, single episode, mild: Secondary | ICD-10-CM | POA: Diagnosis not present

## 2021-12-23 DIAGNOSIS — F320 Major depressive disorder, single episode, mild: Secondary | ICD-10-CM | POA: Diagnosis not present

## 2021-12-25 DIAGNOSIS — F320 Major depressive disorder, single episode, mild: Secondary | ICD-10-CM | POA: Diagnosis not present

## 2021-12-26 DIAGNOSIS — F320 Major depressive disorder, single episode, mild: Secondary | ICD-10-CM | POA: Diagnosis not present

## 2021-12-27 DIAGNOSIS — Z1152 Encounter for screening for COVID-19: Secondary | ICD-10-CM | POA: Diagnosis not present

## 2022-01-01 DIAGNOSIS — F320 Major depressive disorder, single episode, mild: Secondary | ICD-10-CM | POA: Diagnosis not present

## 2022-01-02 DIAGNOSIS — F320 Major depressive disorder, single episode, mild: Secondary | ICD-10-CM | POA: Diagnosis not present

## 2022-01-02 DIAGNOSIS — Z1152 Encounter for screening for COVID-19: Secondary | ICD-10-CM | POA: Diagnosis not present

## 2022-01-04 DIAGNOSIS — F320 Major depressive disorder, single episode, mild: Secondary | ICD-10-CM | POA: Diagnosis not present

## 2022-01-04 DIAGNOSIS — Z20822 Contact with and (suspected) exposure to covid-19: Secondary | ICD-10-CM | POA: Diagnosis not present

## 2022-01-09 DIAGNOSIS — F320 Major depressive disorder, single episode, mild: Secondary | ICD-10-CM | POA: Diagnosis not present

## 2022-01-10 DIAGNOSIS — Z1152 Encounter for screening for COVID-19: Secondary | ICD-10-CM | POA: Diagnosis not present

## 2022-01-10 DIAGNOSIS — F320 Major depressive disorder, single episode, mild: Secondary | ICD-10-CM | POA: Diagnosis not present

## 2022-01-10 DIAGNOSIS — F17200 Nicotine dependence, unspecified, uncomplicated: Secondary | ICD-10-CM | POA: Diagnosis not present

## 2022-01-16 DIAGNOSIS — F320 Major depressive disorder, single episode, mild: Secondary | ICD-10-CM | POA: Diagnosis not present

## 2022-01-17 DIAGNOSIS — F320 Major depressive disorder, single episode, mild: Secondary | ICD-10-CM | POA: Diagnosis not present

## 2022-01-18 DIAGNOSIS — Z1152 Encounter for screening for COVID-19: Secondary | ICD-10-CM | POA: Diagnosis not present

## 2022-01-19 DIAGNOSIS — F320 Major depressive disorder, single episode, mild: Secondary | ICD-10-CM | POA: Diagnosis not present

## 2022-01-21 DIAGNOSIS — F320 Major depressive disorder, single episode, mild: Secondary | ICD-10-CM | POA: Diagnosis not present

## 2022-01-22 DIAGNOSIS — F320 Major depressive disorder, single episode, mild: Secondary | ICD-10-CM | POA: Diagnosis not present

## 2022-01-23 DIAGNOSIS — F320 Major depressive disorder, single episode, mild: Secondary | ICD-10-CM | POA: Diagnosis not present

## 2022-01-24 DIAGNOSIS — Z1152 Encounter for screening for COVID-19: Secondary | ICD-10-CM | POA: Diagnosis not present

## 2022-01-28 DIAGNOSIS — F320 Major depressive disorder, single episode, mild: Secondary | ICD-10-CM | POA: Diagnosis not present

## 2022-01-30 DIAGNOSIS — F320 Major depressive disorder, single episode, mild: Secondary | ICD-10-CM | POA: Diagnosis not present

## 2022-01-31 DIAGNOSIS — F320 Major depressive disorder, single episode, mild: Secondary | ICD-10-CM | POA: Diagnosis not present

## 2022-02-06 DIAGNOSIS — F320 Major depressive disorder, single episode, mild: Secondary | ICD-10-CM | POA: Diagnosis not present

## 2022-02-09 DIAGNOSIS — F320 Major depressive disorder, single episode, mild: Secondary | ICD-10-CM | POA: Diagnosis not present

## 2022-02-09 DIAGNOSIS — F17200 Nicotine dependence, unspecified, uncomplicated: Secondary | ICD-10-CM | POA: Diagnosis not present

## 2022-02-10 ENCOUNTER — Other Ambulatory Visit: Payer: Self-pay | Admitting: Obstetrics

## 2022-02-10 DIAGNOSIS — N760 Acute vaginitis: Secondary | ICD-10-CM

## 2022-02-10 MED ORDER — METRONIDAZOLE 0.75 % VA GEL: 1.0000 | Freq: Two times a day (BID) | VAGINAL | 5 refills | Status: DC

## 2022-02-13 DIAGNOSIS — F320 Major depressive disorder, single episode, mild: Secondary | ICD-10-CM | POA: Diagnosis not present

## 2022-02-17 DIAGNOSIS — F320 Major depressive disorder, single episode, mild: Secondary | ICD-10-CM | POA: Diagnosis not present

## 2022-02-17 DIAGNOSIS — F17200 Nicotine dependence, unspecified, uncomplicated: Secondary | ICD-10-CM | POA: Diagnosis not present

## 2022-02-19 DIAGNOSIS — F17200 Nicotine dependence, unspecified, uncomplicated: Secondary | ICD-10-CM | POA: Diagnosis not present

## 2022-02-19 DIAGNOSIS — F320 Major depressive disorder, single episode, mild: Secondary | ICD-10-CM | POA: Diagnosis not present

## 2022-02-20 DIAGNOSIS — F320 Major depressive disorder, single episode, mild: Secondary | ICD-10-CM | POA: Diagnosis not present

## 2022-02-24 DIAGNOSIS — Z1152 Encounter for screening for COVID-19: Secondary | ICD-10-CM | POA: Diagnosis not present

## 2022-02-25 DIAGNOSIS — F320 Major depressive disorder, single episode, mild: Secondary | ICD-10-CM | POA: Diagnosis not present

## 2022-02-25 DIAGNOSIS — F17200 Nicotine dependence, unspecified, uncomplicated: Secondary | ICD-10-CM | POA: Diagnosis not present

## 2022-02-27 DIAGNOSIS — F320 Major depressive disorder, single episode, mild: Secondary | ICD-10-CM | POA: Diagnosis not present

## 2022-02-28 DIAGNOSIS — F320 Major depressive disorder, single episode, mild: Secondary | ICD-10-CM | POA: Diagnosis not present

## 2022-02-28 DIAGNOSIS — F17200 Nicotine dependence, unspecified, uncomplicated: Secondary | ICD-10-CM | POA: Diagnosis not present

## 2022-03-02 DIAGNOSIS — F17200 Nicotine dependence, unspecified, uncomplicated: Secondary | ICD-10-CM | POA: Diagnosis not present

## 2022-03-02 DIAGNOSIS — F320 Major depressive disorder, single episode, mild: Secondary | ICD-10-CM | POA: Diagnosis not present

## 2022-03-04 DIAGNOSIS — F17200 Nicotine dependence, unspecified, uncomplicated: Secondary | ICD-10-CM | POA: Diagnosis not present

## 2022-03-04 DIAGNOSIS — F320 Major depressive disorder, single episode, mild: Secondary | ICD-10-CM | POA: Diagnosis not present

## 2022-03-06 DIAGNOSIS — F320 Major depressive disorder, single episode, mild: Secondary | ICD-10-CM | POA: Diagnosis not present

## 2022-03-10 DIAGNOSIS — F320 Major depressive disorder, single episode, mild: Secondary | ICD-10-CM | POA: Diagnosis not present

## 2022-03-14 DIAGNOSIS — F320 Major depressive disorder, single episode, mild: Secondary | ICD-10-CM | POA: Diagnosis not present

## 2022-03-16 DIAGNOSIS — F320 Major depressive disorder, single episode, mild: Secondary | ICD-10-CM | POA: Diagnosis not present

## 2022-03-17 ENCOUNTER — Ambulatory Visit: Payer: Medicaid Other | Admitting: Obstetrics

## 2022-03-20 DIAGNOSIS — F320 Major depressive disorder, single episode, mild: Secondary | ICD-10-CM | POA: Diagnosis not present

## 2022-03-28 DIAGNOSIS — F320 Major depressive disorder, single episode, mild: Secondary | ICD-10-CM | POA: Diagnosis not present

## 2022-04-01 DIAGNOSIS — F320 Major depressive disorder, single episode, mild: Secondary | ICD-10-CM | POA: Diagnosis not present

## 2022-04-02 ENCOUNTER — Ambulatory Visit: Payer: Medicaid Other | Admitting: Obstetrics and Gynecology

## 2022-04-04 DIAGNOSIS — F320 Major depressive disorder, single episode, mild: Secondary | ICD-10-CM | POA: Diagnosis not present

## 2022-04-12 DIAGNOSIS — F320 Major depressive disorder, single episode, mild: Secondary | ICD-10-CM | POA: Diagnosis not present

## 2022-04-17 DIAGNOSIS — F320 Major depressive disorder, single episode, mild: Secondary | ICD-10-CM | POA: Diagnosis not present

## 2022-04-19 DIAGNOSIS — F320 Major depressive disorder, single episode, mild: Secondary | ICD-10-CM | POA: Diagnosis not present

## 2022-04-21 DIAGNOSIS — F320 Major depressive disorder, single episode, mild: Secondary | ICD-10-CM | POA: Diagnosis not present

## 2022-04-24 DIAGNOSIS — F320 Major depressive disorder, single episode, mild: Secondary | ICD-10-CM | POA: Diagnosis not present

## 2022-04-26 DIAGNOSIS — F320 Major depressive disorder, single episode, mild: Secondary | ICD-10-CM | POA: Diagnosis not present

## 2022-04-27 DIAGNOSIS — F320 Major depressive disorder, single episode, mild: Secondary | ICD-10-CM | POA: Diagnosis not present

## 2022-04-30 DIAGNOSIS — F320 Major depressive disorder, single episode, mild: Secondary | ICD-10-CM | POA: Diagnosis not present

## 2022-05-03 DIAGNOSIS — F320 Major depressive disorder, single episode, mild: Secondary | ICD-10-CM | POA: Diagnosis not present

## 2022-05-06 DIAGNOSIS — F320 Major depressive disorder, single episode, mild: Secondary | ICD-10-CM | POA: Diagnosis not present

## 2022-05-08 DIAGNOSIS — F320 Major depressive disorder, single episode, mild: Secondary | ICD-10-CM | POA: Diagnosis not present

## 2022-05-10 DIAGNOSIS — F320 Major depressive disorder, single episode, mild: Secondary | ICD-10-CM | POA: Diagnosis not present

## 2022-05-11 DIAGNOSIS — F320 Major depressive disorder, single episode, mild: Secondary | ICD-10-CM | POA: Diagnosis not present

## 2022-05-13 DIAGNOSIS — F17200 Nicotine dependence, unspecified, uncomplicated: Secondary | ICD-10-CM | POA: Diagnosis not present

## 2022-05-13 DIAGNOSIS — F320 Major depressive disorder, single episode, mild: Secondary | ICD-10-CM | POA: Diagnosis not present

## 2022-05-14 DIAGNOSIS — F320 Major depressive disorder, single episode, mild: Secondary | ICD-10-CM | POA: Diagnosis not present

## 2022-05-14 DIAGNOSIS — F17200 Nicotine dependence, unspecified, uncomplicated: Secondary | ICD-10-CM | POA: Diagnosis not present

## 2022-05-17 DIAGNOSIS — F17200 Nicotine dependence, unspecified, uncomplicated: Secondary | ICD-10-CM | POA: Diagnosis not present

## 2022-05-17 DIAGNOSIS — F320 Major depressive disorder, single episode, mild: Secondary | ICD-10-CM | POA: Diagnosis not present

## 2022-05-22 DIAGNOSIS — F17200 Nicotine dependence, unspecified, uncomplicated: Secondary | ICD-10-CM | POA: Diagnosis not present

## 2022-05-22 DIAGNOSIS — F320 Major depressive disorder, single episode, mild: Secondary | ICD-10-CM | POA: Diagnosis not present

## 2022-05-23 DIAGNOSIS — F320 Major depressive disorder, single episode, mild: Secondary | ICD-10-CM | POA: Diagnosis not present

## 2022-05-23 DIAGNOSIS — F17200 Nicotine dependence, unspecified, uncomplicated: Secondary | ICD-10-CM | POA: Diagnosis not present

## 2022-06-04 DIAGNOSIS — F320 Major depressive disorder, single episode, mild: Secondary | ICD-10-CM | POA: Diagnosis not present

## 2022-06-07 DIAGNOSIS — F320 Major depressive disorder, single episode, mild: Secondary | ICD-10-CM | POA: Diagnosis not present

## 2022-06-10 DIAGNOSIS — F320 Major depressive disorder, single episode, mild: Secondary | ICD-10-CM | POA: Diagnosis not present

## 2022-06-11 DIAGNOSIS — F320 Major depressive disorder, single episode, mild: Secondary | ICD-10-CM | POA: Diagnosis not present

## 2022-06-12 DIAGNOSIS — F320 Major depressive disorder, single episode, mild: Secondary | ICD-10-CM | POA: Diagnosis not present

## 2022-06-16 DIAGNOSIS — F320 Major depressive disorder, single episode, mild: Secondary | ICD-10-CM | POA: Diagnosis not present

## 2022-06-20 DIAGNOSIS — F320 Major depressive disorder, single episode, mild: Secondary | ICD-10-CM | POA: Diagnosis not present

## 2022-06-24 DIAGNOSIS — F320 Major depressive disorder, single episode, mild: Secondary | ICD-10-CM | POA: Diagnosis not present

## 2022-06-28 DIAGNOSIS — F320 Major depressive disorder, single episode, mild: Secondary | ICD-10-CM | POA: Diagnosis not present

## 2022-06-30 DIAGNOSIS — F320 Major depressive disorder, single episode, mild: Secondary | ICD-10-CM | POA: Diagnosis not present

## 2022-07-01 DIAGNOSIS — F320 Major depressive disorder, single episode, mild: Secondary | ICD-10-CM | POA: Diagnosis not present

## 2022-07-19 DIAGNOSIS — F320 Major depressive disorder, single episode, mild: Secondary | ICD-10-CM | POA: Diagnosis not present

## 2022-07-21 DIAGNOSIS — F320 Major depressive disorder, single episode, mild: Secondary | ICD-10-CM | POA: Diagnosis not present

## 2022-07-24 DIAGNOSIS — F320 Major depressive disorder, single episode, mild: Secondary | ICD-10-CM | POA: Diagnosis not present

## 2022-07-28 DIAGNOSIS — F320 Major depressive disorder, single episode, mild: Secondary | ICD-10-CM | POA: Diagnosis not present

## 2022-08-05 DIAGNOSIS — F320 Major depressive disorder, single episode, mild: Secondary | ICD-10-CM | POA: Diagnosis not present

## 2022-08-08 DIAGNOSIS — F320 Major depressive disorder, single episode, mild: Secondary | ICD-10-CM | POA: Diagnosis not present

## 2022-08-11 DIAGNOSIS — F320 Major depressive disorder, single episode, mild: Secondary | ICD-10-CM | POA: Diagnosis not present

## 2022-08-16 DIAGNOSIS — F320 Major depressive disorder, single episode, mild: Secondary | ICD-10-CM | POA: Diagnosis not present

## 2022-08-20 DIAGNOSIS — F320 Major depressive disorder, single episode, mild: Secondary | ICD-10-CM | POA: Diagnosis not present

## 2022-08-22 DIAGNOSIS — F320 Major depressive disorder, single episode, mild: Secondary | ICD-10-CM | POA: Diagnosis not present

## 2022-08-24 DIAGNOSIS — F320 Major depressive disorder, single episode, mild: Secondary | ICD-10-CM | POA: Diagnosis not present

## 2022-09-01 DIAGNOSIS — F320 Major depressive disorder, single episode, mild: Secondary | ICD-10-CM | POA: Diagnosis not present

## 2022-09-05 DIAGNOSIS — F320 Major depressive disorder, single episode, mild: Secondary | ICD-10-CM | POA: Diagnosis not present

## 2022-09-08 DIAGNOSIS — F320 Major depressive disorder, single episode, mild: Secondary | ICD-10-CM | POA: Diagnosis not present

## 2022-09-10 DIAGNOSIS — F320 Major depressive disorder, single episode, mild: Secondary | ICD-10-CM | POA: Diagnosis not present

## 2022-09-16 DIAGNOSIS — F320 Major depressive disorder, single episode, mild: Secondary | ICD-10-CM | POA: Diagnosis not present

## 2022-09-18 DIAGNOSIS — F320 Major depressive disorder, single episode, mild: Secondary | ICD-10-CM | POA: Diagnosis not present

## 2022-09-22 DIAGNOSIS — F320 Major depressive disorder, single episode, mild: Secondary | ICD-10-CM | POA: Diagnosis not present

## 2022-09-22 DIAGNOSIS — F17200 Nicotine dependence, unspecified, uncomplicated: Secondary | ICD-10-CM | POA: Diagnosis not present

## 2022-09-22 DIAGNOSIS — F331 Major depressive disorder, recurrent, moderate: Secondary | ICD-10-CM | POA: Diagnosis not present

## 2022-09-24 DIAGNOSIS — F320 Major depressive disorder, single episode, mild: Secondary | ICD-10-CM | POA: Diagnosis not present

## 2022-09-30 DIAGNOSIS — F320 Major depressive disorder, single episode, mild: Secondary | ICD-10-CM | POA: Diagnosis not present

## 2022-10-05 DIAGNOSIS — F320 Major depressive disorder, single episode, mild: Secondary | ICD-10-CM | POA: Diagnosis not present

## 2022-10-11 DIAGNOSIS — F320 Major depressive disorder, single episode, mild: Secondary | ICD-10-CM | POA: Diagnosis not present

## 2022-10-18 DIAGNOSIS — F320 Major depressive disorder, single episode, mild: Secondary | ICD-10-CM | POA: Diagnosis not present

## 2022-10-29 DIAGNOSIS — F320 Major depressive disorder, single episode, mild: Secondary | ICD-10-CM | POA: Diagnosis not present

## 2022-10-30 DIAGNOSIS — F320 Major depressive disorder, single episode, mild: Secondary | ICD-10-CM | POA: Diagnosis not present

## 2022-11-03 DIAGNOSIS — F320 Major depressive disorder, single episode, mild: Secondary | ICD-10-CM | POA: Diagnosis not present

## 2022-11-07 DIAGNOSIS — F320 Major depressive disorder, single episode, mild: Secondary | ICD-10-CM | POA: Diagnosis not present

## 2022-11-09 DIAGNOSIS — F320 Major depressive disorder, single episode, mild: Secondary | ICD-10-CM | POA: Diagnosis not present

## 2022-11-11 DIAGNOSIS — F320 Major depressive disorder, single episode, mild: Secondary | ICD-10-CM | POA: Diagnosis not present

## 2022-11-17 DIAGNOSIS — F320 Major depressive disorder, single episode, mild: Secondary | ICD-10-CM | POA: Diagnosis not present

## 2022-11-23 DIAGNOSIS — F320 Major depressive disorder, single episode, mild: Secondary | ICD-10-CM | POA: Diagnosis not present

## 2022-11-27 DIAGNOSIS — F320 Major depressive disorder, single episode, mild: Secondary | ICD-10-CM | POA: Diagnosis not present

## 2022-11-30 DIAGNOSIS — F320 Major depressive disorder, single episode, mild: Secondary | ICD-10-CM | POA: Diagnosis not present

## 2022-12-04 DIAGNOSIS — F320 Major depressive disorder, single episode, mild: Secondary | ICD-10-CM | POA: Diagnosis not present

## 2022-12-07 DIAGNOSIS — F320 Major depressive disorder, single episode, mild: Secondary | ICD-10-CM | POA: Diagnosis not present

## 2022-12-11 DIAGNOSIS — F320 Major depressive disorder, single episode, mild: Secondary | ICD-10-CM | POA: Diagnosis not present

## 2022-12-15 DIAGNOSIS — F320 Major depressive disorder, single episode, mild: Secondary | ICD-10-CM | POA: Diagnosis not present

## 2022-12-17 DIAGNOSIS — F320 Major depressive disorder, single episode, mild: Secondary | ICD-10-CM | POA: Diagnosis not present

## 2022-12-22 DIAGNOSIS — F320 Major depressive disorder, single episode, mild: Secondary | ICD-10-CM | POA: Diagnosis not present

## 2022-12-24 DIAGNOSIS — F320 Major depressive disorder, single episode, mild: Secondary | ICD-10-CM | POA: Diagnosis not present

## 2022-12-25 ENCOUNTER — Telehealth: Payer: Medicaid Other | Admitting: Physician Assistant

## 2022-12-25 ENCOUNTER — Other Ambulatory Visit: Payer: Self-pay | Admitting: Physician Assistant

## 2022-12-25 DIAGNOSIS — I10 Essential (primary) hypertension: Secondary | ICD-10-CM | POA: Diagnosis not present

## 2022-12-25 DIAGNOSIS — Z760 Encounter for issue of repeat prescription: Secondary | ICD-10-CM

## 2022-12-25 DIAGNOSIS — N760 Acute vaginitis: Secondary | ICD-10-CM

## 2022-12-25 DIAGNOSIS — B9689 Other specified bacterial agents as the cause of diseases classified elsewhere: Secondary | ICD-10-CM

## 2022-12-25 MED ORDER — TRIAMTERENE-HCTZ 37.5-25 MG PO TABS: 1.0000 | ORAL_TABLET | Freq: Every day | ORAL | 0 refills | Status: DC

## 2022-12-25 MED ORDER — METRONIDAZOLE 0.75 % EX GEL: CUTANEOUS | 0 refills | Status: DC

## 2022-12-25 MED ORDER — METRONIDAZOLE 500 MG PO TABS: 500.0000 mg | ORAL_TABLET | Freq: Two times a day (BID) | ORAL | 0 refills | Status: DC

## 2022-12-25 NOTE — Progress Notes (Signed)
Virtual Visit Consent   JAMAR CASAGRANDE, you are scheduled for a virtual visit with a [facility] provider today. Just as with appointments in the office, your consent must be obtained to participate. Your consent will be active for this visit and any virtual visit you may have with one of our providers in the next 365 days. If you have a MyChart account, a copy of this consent can be sent to you electronically.  As this is a virtual visit, video technology does not allow for your provider to perform a traditional examination. This may limit your provider's ability to fully assess your condition. If your provider identifies any concerns that need to be evaluated in person or the need to arrange testing (such as labs, EKG, etc.), we will make arrangements to do so. Although advances in technology are sophisticated, we cannot ensure that it will always work on either your end or our end. If the connection with a video visit is poor, the visit may have to be switched to a telephone visit. With either a video or telephone visit, we are not always able to ensure that we have a secure connection.  By engaging in this virtual visit, you consent to the provision of healthcare and authorize for your insurance to be billed (if applicable) for the services provided during this visit. Depending on your insurance coverage, you may receive a charge related to this service.  I need to obtain your verbal consent now. Are you willing to proceed with your visit today? Sydney Griffin has provided verbal consent on 12/25/2022 for a virtual visit (video or telephone). [name], [place]  Date: 12/25/2022 5:04 PM  Virtual Visit via Video Note   I, [name], connected with  Sydney Griffin  ([id number], [birth year]) on 12/25/22 at  5:00 PM EDT by a video-enabled telemedicine application and verified that I am speaking with the correct person using two identifiers.  Location: Patient: Virtual Visit  Location Patient: Home Provider: Virtual Visit Location Provider: Home Office   I discussed the limitations of evaluation and management by telemedicine and the availability of in person appointments. The patient expressed understanding and agreed to proceed.    History of Present Illness: Sydney Griffin is a 42 y.o. who identifies as a female who was assigned female at birth, and is being seen today for multiple concerns.  Patient is requesting a refill of her BP medication as she is out for the past 2 weeks. Is currently on a regimen of triamterene-HCTZ 37.5-25 mg daily. Notes some foot swelling of of her medications along with occasional headache. Patient denies chest pain, palpitations, lightheadedness, dizziness, vision changes or frequent headaches.  Endorses concern for BV as well. Notes strong-smelling odor and mild occasional discomfort. Denies dysuria, urinary frequency, hematuria. Denies concern for pregnancy or STI. LMP ended 3 days ago.     HPI: HPI  Problems:  Patient Active Problem List   Diagnosis Date Noted   History of cesarean section 10/30/2019   IUD (intrauterine device) in place 10/30/2019   AMA (advanced maternal age) multigravida 35+ 06/13/2019   Encounter for supervision of high risk multigravida of advanced maternal age, antepartum 04/11/2019   Chronic hypertension during pregnancy 04/11/2019   Tobacco use complicating pregnancy 04/11/2019   Suboxone maintenance treatment complicating pregnancy, antepartum 04/11/2019   Carpal tunnel syndrome 02/22/2013   Tobacco abuse 02/15/2013   Previous cesarean delivery, antepartum 03/27/2012   Morbid obesity (HCC) 03/27/2012  Allergies:  Allergies  Allergen Reactions   Aspirin Shortness Of Breath   Dilaudid [Hydromorphone Hcl] Other (See Comments)    Pt says medication causes her to shake uncontrollably.   Ibuprofen Other (See Comments)    Gi upset   Tramadol Nausea Only and Other (See Comments)    Causes  headache.   Vicodin [Hydrocodone-Acetaminophen] Other (See Comments)    Causes headache.   Medications:  Current Outpatient Medications:    buprenorphine-naloxone (SUBOXONE) 8-2 mg SUBL SL tablet, Place 1 tablet under the tongue daily. Pt states she takes anywhere from 1-3  daily, Disp: , Rfl:    ibuprofen (ADVIL) 800 MG tablet, Take 1 tablet (800 mg total) by mouth every 8 (eight) hours as needed for cramping., Disp: 30 tablet, Rfl: 5   triamterene-hydrochlorothiazide (MAXZIDE-25) 37.5-25 MG tablet, Take 1 tablet by mouth daily., Disp: 30 tablet, Rfl: 11  Observations/Objective: Patient is well-developed, well-nourished in no acute distress.  Resting comfortably at home.  Head is normocephalic, atraumatic.  No labored breathing.  Speech is clear and coherent with logical content.  Patient is alert and oriented at baseline.   Assessment and Plan: 1. BV (bacterial vaginosis)  Will start Flagyl per orders. Supportive measures reviewed with patient.   2. Medication refill  One-time refill of BP medication given. She will have to establish with a PCP or be evaluated in person for any further refills as she needs BP check and updated labs.   Follow Up Instructions: I discussed the assessment and treatment plan with the patient. The patient was provided an opportunity to ask questions and all were answered. The patient agreed with the plan and demonstrated an understanding of the instructions.  A copy of instructions were sent to the patient via MyChart unless otherwise noted below.   The patient was advised to call back or seek an in-person evaluation if the symptoms worsen or if the condition fails to improve as anticipated.  Time:  I spent 10 minutes with the patient via telehealth technology discussing the above problems/concerns.    [name], PA-C

## 2022-12-25 NOTE — Patient Instructions (Addendum)
Sydney Griffin, thank you for joining [name], PA-C for today's virtual visit.  While this provider is not your primary care provider (PCP), if your PCP is located in our provider database this encounter information will be shared with them immediately following your visit.   A [facility] MyChart account gives you access to today's visit and all your visits, tests, and labs performed at [facility] " click here if you don't have a [facility] MyChart account or go to [email or web address]  Consent: (Patient) Sydney Griffin provided verbal consent for this virtual visit at the beginning of the encounter.  Current Medications:  Current Outpatient Medications:    buprenorphine-naloxone (SUBOXONE) 8-2 mg SUBL SL tablet, Place 1 tablet under the tongue daily. Pt states she takes anywhere from 1-3  daily, Disp: , Rfl:    ibuprofen (ADVIL) 800 MG tablet, Take 1 tablet (800 mg total) by mouth every 8 (eight) hours as needed for cramping., Disp: 30 tablet, Rfl: 5   triamterene-hydrochlorothiazide (MAXZIDE-25) 37.5-25 MG tablet, Take 1 tablet by mouth daily., Disp: 30 tablet, Rfl: 11   Medications ordered in this encounter:  No orders of the defined types were placed in this encounter.    *If you need refills on other medications prior to your next appointment, please contact your pharmacy*  Follow-Up: Call back or seek an in-person evaluation if the symptoms worsen or if the condition fails to improve as anticipated.  [facility] [phone]  Other Instructions I have sent in a refill of your BP medication.  You will need to be evaluated in person with a new primary care provider or at local urgent care (in-person) for further refills.  Take the metronidazole as directed for BV symptoms.  Bacterial Vaginosis  Bacterial vaginosis is an infection of the vagina. It happens when too many normal germs (healthy bacteria) grow in the vagina. This  infection can make it easier to get other infections from sex (STIs). It is very important for pregnant women to get treated. This infection can cause babies to be born early or at a low birth weight. What are the causes? This infection is caused by an increase in certain germs that grow in the vagina. You cannot get this infection from toilet seats, bedsheets, swimming pools, or things that touch your vagina. What increases the risk? Having sex with a new person or more than one person. Having sex without protection. Douching. Having an intrauterine device (IUD). Smoking. Using drugs or drinking alcohol. These can lead you to do things that are risky. Taking certain antibiotic medicines. Being pregnant. What are the signs or symptoms? Some women have no symptoms. Symptoms may include: A discharge from your vagina. It may be gray or white. It can be watery or foamy. A fishy smell. This can happen after sex or during your menstrual period. Itching in and around your vagina. A feeling of burning or pain when you pee (urinate). How is this treated? This infection is treated with antibiotic medicines. These may be given to you as: A pill. A cream for your vagina. A medicine that you put into your vagina (suppository). If the infection comes back after treatment, you may need more antibiotics. Follow these instructions at home: Medicines Take over-the-counter and prescription medicines as told by your doctor. Take or use your antibiotic medicine as told by your doctor. Do not stop taking or using it, even if you start to feel better. General instructions If the  person you have sex with is a woman, tell her that you have this infection. She will need to follow up with her doctor. If you have a female partner, he does not need to be treated. Do not have sex until you finish treatment. Drink enough fluid to keep your pee pale yellow. Keep your vagina and butt clean. Wash the area with warm  water each day. Wipe from front to back after you use the toilet. If you are breastfeeding a baby, ask your doctor if you should keep doing so during treatment. Keep all follow-up visits. How is this prevented? Self-care Do not douche. Use only warm water to wash around your vagina. Wear underwear that is cotton or lined with cotton. Do not wear tight pants and pantyhose, especially in the summer. Safe sex Use protection when you have sex. This includes: Use condoms. Use dental dams. This is a thin layer that protects the mouth during oral sex. Limit how many people you have sex with. To prevent this infection, it is best to have sex with just one person. Get tested for STIs. The person you have sex with should also get tested. Drugs and alcohol Do not smoke or use any products that contain nicotine or tobacco. If you need help quitting, ask your doctor. Do not use drugs. Do not drink alcohol if: Your doctor tells you not to drink. You are pregnant, may be pregnant, or are planning to become pregnant. If you drink alcohol: Limit how much you have to 0-1 drink a day. Know how much alcohol is in your drink. In the [place] one drink equals one 12 oz bottle of beer (355 mL), one 5 oz glass of wine (148 mL), or one 1 oz glass of hard liquor (44 mL). Where to find more information Centers for Disease Control and Prevention: FootballExhibition.com.br American Sexual Health Association: www.ashastd.org Office on [employer] Health: http://hoffman.com/ Contact a doctor if: Your symptoms do not get better, even after you are treated. You have more discharge or pain when you pee. You have a fever or chills. You have pain in your belly (abdomen) or in the area between your hips. You have pain with sex. You bleed from your vagina between menstrual periods. Summary This infection can happen when too many germs (bacteria) grow in the vagina. This infection can make it easier to get infections from sex (STIs).  Treating this can lower that chance. Get treated if you are pregnant. This infection can cause babies to be born early. Do not stop taking or using your antibiotic medicine, even if you start to feel better. This information is not intended to replace advice given to you by your health care provider. Make sure you discuss any questions you have with your health care provider. Document Revised: 02/22/2020 Document Reviewed: 02/22/2020 Elsevier Patient Education  2023 Elsevier Inc.  If you have been instructed to have an in-person evaluation today at a local Urgent Care facility, please use the link below. It will take you to a list of all of our available [facility], including address, phone number and hours of operation. Please do not delay care.  [facility]  If you or a family member do not have a primary care provider, use the link below to schedule a visit and establish care. When you choose a [facility] primary care physician or advanced practice provider, you gain a long-term partner in health. Find a Primary Care Provider  Learn more  about [facility]'s in-office and virtual care options: [facility]

## 2022-12-28 DIAGNOSIS — F320 Major depressive disorder, single episode, mild: Secondary | ICD-10-CM | POA: Diagnosis not present

## 2023-01-01 DIAGNOSIS — F320 Major depressive disorder, single episode, mild: Secondary | ICD-10-CM | POA: Diagnosis not present

## 2023-01-02 DIAGNOSIS — F320 Major depressive disorder, single episode, mild: Secondary | ICD-10-CM | POA: Diagnosis not present

## 2023-01-06 DIAGNOSIS — F320 Major depressive disorder, single episode, mild: Secondary | ICD-10-CM | POA: Diagnosis not present

## 2023-01-08 DIAGNOSIS — F320 Major depressive disorder, single episode, mild: Secondary | ICD-10-CM | POA: Diagnosis not present

## 2023-01-12 DIAGNOSIS — F331 Major depressive disorder, recurrent, moderate: Secondary | ICD-10-CM | POA: Diagnosis not present

## 2023-01-13 DIAGNOSIS — F320 Major depressive disorder, single episode, mild: Secondary | ICD-10-CM | POA: Diagnosis not present

## 2023-01-14 DIAGNOSIS — F320 Major depressive disorder, single episode, mild: Secondary | ICD-10-CM | POA: Diagnosis not present

## 2023-01-17 DIAGNOSIS — F331 Major depressive disorder, recurrent, moderate: Secondary | ICD-10-CM | POA: Diagnosis not present

## 2023-01-19 DIAGNOSIS — F331 Major depressive disorder, recurrent, moderate: Secondary | ICD-10-CM | POA: Diagnosis not present

## 2023-01-23 DIAGNOSIS — F331 Major depressive disorder, recurrent, moderate: Secondary | ICD-10-CM | POA: Diagnosis not present

## 2023-01-29 DIAGNOSIS — F331 Major depressive disorder, recurrent, moderate: Secondary | ICD-10-CM | POA: Diagnosis not present

## 2023-02-08 DIAGNOSIS — F331 Major depressive disorder, recurrent, moderate: Secondary | ICD-10-CM | POA: Diagnosis not present

## 2023-02-13 DIAGNOSIS — F331 Major depressive disorder, recurrent, moderate: Secondary | ICD-10-CM | POA: Diagnosis not present

## 2023-02-17 DIAGNOSIS — F331 Major depressive disorder, recurrent, moderate: Secondary | ICD-10-CM | POA: Diagnosis not present

## 2023-02-20 DIAGNOSIS — F331 Major depressive disorder, recurrent, moderate: Secondary | ICD-10-CM | POA: Diagnosis not present

## 2023-02-23 DIAGNOSIS — F331 Major depressive disorder, recurrent, moderate: Secondary | ICD-10-CM | POA: Diagnosis not present

## 2023-02-28 DIAGNOSIS — F331 Major depressive disorder, recurrent, moderate: Secondary | ICD-10-CM | POA: Diagnosis not present

## 2023-03-04 DIAGNOSIS — F331 Major depressive disorder, recurrent, moderate: Secondary | ICD-10-CM | POA: Diagnosis not present

## 2023-03-07 DIAGNOSIS — F331 Major depressive disorder, recurrent, moderate: Secondary | ICD-10-CM | POA: Diagnosis not present

## 2023-03-08 DIAGNOSIS — F331 Major depressive disorder, recurrent, moderate: Secondary | ICD-10-CM | POA: Diagnosis not present

## 2023-03-10 DIAGNOSIS — F331 Major depressive disorder, recurrent, moderate: Secondary | ICD-10-CM | POA: Diagnosis not present

## 2023-03-12 DIAGNOSIS — F331 Major depressive disorder, recurrent, moderate: Secondary | ICD-10-CM | POA: Diagnosis not present

## 2023-03-14 ENCOUNTER — Telehealth: Payer: 59 | Admitting: Family

## 2023-03-14 DIAGNOSIS — B9689 Other specified bacterial agents as the cause of diseases classified elsewhere: Secondary | ICD-10-CM | POA: Diagnosis not present

## 2023-03-14 DIAGNOSIS — Z760 Encounter for issue of repeat prescription: Secondary | ICD-10-CM | POA: Diagnosis not present

## 2023-03-14 DIAGNOSIS — N760 Acute vaginitis: Secondary | ICD-10-CM

## 2023-03-14 DIAGNOSIS — I10 Essential (primary) hypertension: Secondary | ICD-10-CM | POA: Diagnosis not present

## 2023-03-14 MED ORDER — METRONIDAZOLE 0.75 % EX GEL: CUTANEOUS | 0 refills | Status: DC

## 2023-03-14 MED ORDER — TRIAMTERENE-HCTZ 37.5-25 MG PO TABS: 1.0000 | ORAL_TABLET | Freq: Every day | ORAL | 0 refills | Status: DC

## 2023-03-14 NOTE — Progress Notes (Signed)
Virtual Visit Consent   Sydney Griffin, you are scheduled for a virtual visit with a [facility] provider today. Just as with appointments in the office, your consent must be obtained to participate. Your consent will be active for this visit and any virtual visit you may have with one of our providers in the next 365 days. If you have a MyChart account, a copy of this consent can be sent to you electronically.  As this is a virtual visit, video technology does not allow for your provider to perform a traditional examination. This may limit your provider's ability to fully assess your condition. If your provider identifies any concerns that need to be evaluated in person or the need to arrange testing (such as labs, EKG, etc.), we will make arrangements to do so. Although advances in technology are sophisticated, we cannot ensure that it will always work on either your end or our end. If the connection with a video visit is poor, the visit may have to be switched to a telephone visit. With either a video or telephone visit, we are not always able to ensure that we have a secure connection.  By engaging in this virtual visit, you consent to the provision of healthcare and authorize for your insurance to be billed (if applicable) for the services provided during this visit. Depending on your insurance coverage, you may receive a charge related to this service.  I need to obtain your verbal consent now. Are you willing to proceed with your visit today? Sydney Griffin has provided verbal consent on 03/14/2023 for a virtual visit (video or telephone). Jannifer Rodney, FNP  Date: 03/14/2023 6:09 PM  Virtual Visit via Video Note   I, Jannifer Rodney, connected with  Sydney Griffin  ([id number], [birth year]) on 03/14/23 at  6:15 PM EDT by a video-enabled telemedicine application and verified that I am speaking with the correct person using two identifiers.  Location: Patient: Virtual Visit Location  Patient: Home Provider: Virtual Visit Location Provider: Home Office   I discussed the limitations of evaluation and management by telemedicine and the availability of in person appointments. The patient expressed understanding and agreed to proceed.    History of Present Illness: Sydney Griffin is a 42 y.o. who identifies as a female who was assigned female at birth, and is being seen today for elevated BP. She usually takes Maxzide 25, however, has been out of it for two days. She reports she has noticed swelling in bilateral legs. Denies any headache. Has not checked her BP at home.   She currently does not have a PCP, but is working on to establish care.   HPI: Hypertension This is a new problem. The current episode started more than 1 year ago. The problem is unchanged. The problem is uncontrolled. Associated symptoms include peripheral edema. Pertinent negatives include no malaise/fatigue or shortness of breath. Risk factors for coronary artery disease include dyslipidemia, obesity and sedentary lifestyle.  Vaginal Discharge The patient's primary symptoms include genital itching and vaginal discharge. The patient's pertinent negatives include no genital odor. This is a recurrent problem. The current episode started more than 1 month ago. The vaginal discharge was white.    Problems:  Patient Active Problem List   Diagnosis Date Noted   History of cesarean section 10/30/2019   IUD (intrauterine device) in place 10/30/2019   AMA (advanced maternal age) multigravida 35+ 06/13/2019   Encounter for supervision of high risk multigravida of advanced maternal age,  antepartum 04/11/2019   Chronic hypertension during pregnancy 04/11/2019   Tobacco use complicating pregnancy 04/11/2019   Suboxone maintenance treatment complicating pregnancy, antepartum (HCC) 04/11/2019   Carpal tunnel syndrome 02/22/2013   Tobacco abuse 02/15/2013   Previous cesarean delivery, antepartum 03/27/2012   Morbid  obesity (HCC) 03/27/2012    Allergies:  Allergies  Allergen Reactions   Aspirin Shortness Of Breath   Dilaudid [Hydromorphone Hcl] Other (See Comments)    Pt says medication causes her to shake uncontrollably.   Ibuprofen Other (See Comments)    Gi upset   Tramadol Nausea Only and Other (See Comments)    Causes headache.   Vicodin [Hydrocodone-Acetaminophen] Other (See Comments)    Causes headache.   Medications:  Current Outpatient Medications:    buprenorphine-naloxone (SUBOXONE) 8-2 mg SUBL SL tablet, Place 1 tablet under the tongue daily. Pt states she takes anywhere from 1-3  daily, Disp: , Rfl:    ibuprofen (ADVIL) 800 MG tablet, Take 1 tablet (800 mg total) by mouth every 8 (eight) hours as needed for cramping., Disp: 30 tablet, Rfl: 5   metroNIDAZOLE (METROGEL) 0.75 % gel, Insert one applicatorful (5g) of medicine into the vagina once nightly x 5 days, Disp: 25 g, Rfl: 0   triamterene-hydrochlorothiazide (MAXZIDE-25) 37.5-25 MG tablet, Take 1 tablet by mouth daily. ONE TIME REFILL. WILL NEED IN-PERSON APPT WITH NEW PCP OR URGENT CARE, Disp: 60 tablet, Rfl: 0  Observations/Objective: Patient is well-developed, well-nourished in no acute distress.  Resting comfortably  at home.  Head is normocephalic, atraumatic.  No labored breathing.  Speech is clear and coherent with logical content.  Patient is alert and oriented at baseline.    Assessment and Plan: 1. Medication refill - triamterene-hydrochlorothiazide (MAXZIDE-25) 37.5-25 MG tablet; Take 1 tablet by mouth daily. ONE TIME REFILL. WILL NEED IN-PERSON APPT WITH NEW PCP OR URGENT CARE  Dispense: 60 tablet; Refill: 0  2. BV (bacterial vaginosis) - metroNIDAZOLE (METROGEL) 0.75 % gel; Insert one applicatorful (5g) of medicine into the vagina once nightly x 5 days  Dispense: 25 g; Refill: 0  3. HTN (hypertension), benign  Restart Maxzide -Dash diet information given -Exercise encouraged - Stress Management  -Continue  current meds -Follow up with new PCP for next refill    Follow Up Instructions: I discussed the assessment and treatment plan with the patient. The patient was provided an opportunity to ask questions and all were answered. The patient agreed with the plan and demonstrated an understanding of the instructions.  A copy of instructions were sent to the patient via MyChart unless otherwise noted below.     The patient was advised to call back or seek an in-person evaluation if the symptoms worsen or if the condition fails to improve as anticipated.  Time:  I spent 9 minutes with the patient via telehealth technology discussing the above problems/concerns.    Jannifer Rodney, FNP

## 2023-03-16 DIAGNOSIS — F331 Major depressive disorder, recurrent, moderate: Secondary | ICD-10-CM | POA: Diagnosis not present

## 2023-03-17 ENCOUNTER — Other Ambulatory Visit: Payer: Self-pay | Admitting: Family

## 2023-03-17 DIAGNOSIS — Z760 Encounter for issue of repeat prescription: Secondary | ICD-10-CM

## 2023-03-18 DIAGNOSIS — F331 Major depressive disorder, recurrent, moderate: Secondary | ICD-10-CM | POA: Diagnosis not present

## 2023-03-24 DIAGNOSIS — F331 Major depressive disorder, recurrent, moderate: Secondary | ICD-10-CM | POA: Diagnosis not present

## 2023-03-29 DIAGNOSIS — F331 Major depressive disorder, recurrent, moderate: Secondary | ICD-10-CM | POA: Diagnosis not present

## 2023-03-31 DIAGNOSIS — F331 Major depressive disorder, recurrent, moderate: Secondary | ICD-10-CM | POA: Diagnosis not present

## 2023-04-07 DIAGNOSIS — F331 Major depressive disorder, recurrent, moderate: Secondary | ICD-10-CM | POA: Diagnosis not present

## 2023-04-08 DIAGNOSIS — F331 Major depressive disorder, recurrent, moderate: Secondary | ICD-10-CM | POA: Diagnosis not present

## 2023-04-11 DIAGNOSIS — F331 Major depressive disorder, recurrent, moderate: Secondary | ICD-10-CM | POA: Diagnosis not present

## 2023-04-18 DIAGNOSIS — F331 Major depressive disorder, recurrent, moderate: Secondary | ICD-10-CM | POA: Diagnosis not present

## 2023-04-19 ENCOUNTER — Other Ambulatory Visit: Payer: Self-pay | Admitting: Family

## 2023-04-19 DIAGNOSIS — Z760 Encounter for issue of repeat prescription: Secondary | ICD-10-CM

## 2023-04-22 DIAGNOSIS — F331 Major depressive disorder, recurrent, moderate: Secondary | ICD-10-CM | POA: Diagnosis not present

## 2023-04-24 DIAGNOSIS — F331 Major depressive disorder, recurrent, moderate: Secondary | ICD-10-CM | POA: Diagnosis not present

## 2023-04-29 DIAGNOSIS — F331 Major depressive disorder, recurrent, moderate: Secondary | ICD-10-CM | POA: Diagnosis not present

## 2023-05-01 DIAGNOSIS — F331 Major depressive disorder, recurrent, moderate: Secondary | ICD-10-CM | POA: Diagnosis not present

## 2023-05-03 DIAGNOSIS — F331 Major depressive disorder, recurrent, moderate: Secondary | ICD-10-CM | POA: Diagnosis not present

## 2023-05-07 DIAGNOSIS — F331 Major depressive disorder, recurrent, moderate: Secondary | ICD-10-CM | POA: Diagnosis not present

## 2023-05-10 DIAGNOSIS — F331 Major depressive disorder, recurrent, moderate: Secondary | ICD-10-CM | POA: Diagnosis not present

## 2023-05-13 DIAGNOSIS — F331 Major depressive disorder, recurrent, moderate: Secondary | ICD-10-CM | POA: Diagnosis not present

## 2023-05-18 DIAGNOSIS — F331 Major depressive disorder, recurrent, moderate: Secondary | ICD-10-CM | POA: Diagnosis not present

## 2023-05-19 DIAGNOSIS — F331 Major depressive disorder, recurrent, moderate: Secondary | ICD-10-CM | POA: Diagnosis not present

## 2023-05-21 DIAGNOSIS — F331 Major depressive disorder, recurrent, moderate: Secondary | ICD-10-CM | POA: Diagnosis not present

## 2023-05-29 DIAGNOSIS — F331 Major depressive disorder, recurrent, moderate: Secondary | ICD-10-CM | POA: Diagnosis not present

## 2023-05-31 DIAGNOSIS — F331 Major depressive disorder, recurrent, moderate: Secondary | ICD-10-CM | POA: Diagnosis not present

## 2023-06-05 DIAGNOSIS — F331 Major depressive disorder, recurrent, moderate: Secondary | ICD-10-CM | POA: Diagnosis not present

## 2023-06-08 DIAGNOSIS — F331 Major depressive disorder, recurrent, moderate: Secondary | ICD-10-CM | POA: Diagnosis not present

## 2023-06-11 DIAGNOSIS — F331 Major depressive disorder, recurrent, moderate: Secondary | ICD-10-CM | POA: Diagnosis not present

## 2023-06-14 DIAGNOSIS — F331 Major depressive disorder, recurrent, moderate: Secondary | ICD-10-CM | POA: Diagnosis not present

## 2023-06-17 DIAGNOSIS — F331 Major depressive disorder, recurrent, moderate: Secondary | ICD-10-CM | POA: Diagnosis not present

## 2023-06-19 ENCOUNTER — Telehealth: Payer: 59 | Admitting: Family Medicine

## 2023-06-19 DIAGNOSIS — I10 Essential (primary) hypertension: Secondary | ICD-10-CM

## 2023-06-19 DIAGNOSIS — N760 Acute vaginitis: Secondary | ICD-10-CM

## 2023-06-19 DIAGNOSIS — B9689 Other specified bacterial agents as the cause of diseases classified elsewhere: Secondary | ICD-10-CM | POA: Diagnosis not present

## 2023-06-19 MED ORDER — METRONIDAZOLE 0.75 % EX GEL: CUTANEOUS | 0 refills | Status: DC

## 2023-06-19 MED ORDER — TRIAMTERENE-HCTZ 37.5-25 MG PO TABS: 1.0000 | ORAL_TABLET | Freq: Every day | ORAL | 0 refills | Status: DC

## 2023-06-19 NOTE — Patient Instructions (Signed)
Sydney Griffin, thank you for joining Reed Pandy, PA-C for today's virtual visit.  While this provider is not your primary care provider (PCP), if your PCP is located in our provider database this encounter information will be shared with them immediately following your visit.   A [facility] MyChart account gives you access to today's visit and all your visits, tests, and labs performed at [facility] " click here if you don't have a [facility] MyChart account or go to [email or web address]  Consent: (Patient) Sydney Griffin provided verbal consent for this virtual visit at the beginning of the encounter.  Current Medications:  Current Outpatient Medications:    buprenorphine-naloxone (SUBOXONE) 8-2 mg SUBL SL tablet, Place 1 tablet under the tongue daily. Pt states she takes anywhere from 1-3  daily, Disp: , Rfl:    ibuprofen (ADVIL) 800 MG tablet, Take 1 tablet (800 mg total) by mouth every 8 (eight) hours as needed for cramping., Disp: 30 tablet, Rfl: 5   metroNIDAZOLE (METROGEL) 0.75 % gel, Insert one applicatorful (5g) of medicine into the vagina once nightly x 5 days, Disp: 25 g, Rfl: 0   triamterene-hydrochlorothiazide (MAXZIDE-25) 37.5-25 MG tablet, Take 1 tablet by mouth daily. ONE TIME REFILL. WILL NEED IN-PERSON APPT WITH NEW PCP OR URGENT CARE, Disp: 30 tablet, Rfl: 0   Medications ordered in this encounter:  Meds ordered this encounter  Medications   triamterene-hydrochlorothiazide (MAXZIDE-25) 37.5-25 MG tablet    Sig: Take 1 tablet by mouth daily. ONE TIME REFILL. WILL NEED IN-PERSON APPT WITH NEW PCP OR URGENT CARE    Dispense:  30 tablet    Refill:  0   metroNIDAZOLE (METROGEL) 0.75 % gel    Sig: Insert one applicatorful (5g) of medicine into the vagina once nightly x 5 days    Dispense:  25 g    Refill:  0    To take place of Flagyl tablets     *If you need refills on other medications prior to your next appointment, please contact your  pharmacy*  Follow-Up: Call back or seek an in-person evaluation if the symptoms worsen or if the condition fails to improve as anticipated.  [facility] [phone]  Other Instructions Bacterial Vaginosis  Bacterial vaginosis is an infection that occurs when the normal balance of bacteria in the vagina changes. This change is caused by an overgrowth of certain bacteria in the vagina. Bacterial vaginosis is the most common vaginal infection among females aged 56 to 45 years. This condition increases the risk of sexually transmitted infections (STIs). Treatment can help reduce this risk. Treatment is very important for pregnant women because this condition can cause babies to be born early (prematurely) or at a low birth weight. What are the causes? This condition is caused by an increase in harmful bacteria that are normally present in small amounts in the vagina. However, the exact reason this condition develops is not known. You cannot get bacterial vaginosis from toilet seats, bedding, swimming pools, or contact with objects around you. What increases the risk? The following factors may make you more likely to develop this condition: Having a new sexual partner or multiple sexual partners, or having unprotected sex. Douching. Having an intrauterine device (IUD). Smoking. Abusing drugs and alcohol. This may lead to riskier sexual behavior. Taking certain antibiotic medicines. Being pregnant. What are the signs or symptoms? Some women with this condition have no symptoms. Symptoms may include: Wallace Cullens or white vaginal discharge. The discharge  can be watery or foamy. A fish-like odor with discharge, especially after sex or during menstruation. Itching in and around the vagina. Burning or pain with urination. How is this diagnosed? This condition is diagnosed based on: Your medical history. A physical exam of the vagina. Checking a sample of vaginal fluid for harmful  bacteria or abnormal cells. How is this treated? This condition is treated with antibiotic medicines. These may be given as a pill, a vaginal cream, or a medicine that is put into the vagina (suppository). If the condition comes back after treatment, a second round of antibiotics may be needed. Follow these instructions at home: Medicines Take or apply over-the-counter and prescription medicines only as told by your health care provider. Take or apply your antibiotic medicine as told by your health care provider. Do not stop using the antibiotic even if you start to feel better. General instructions If you have a female sexual partner, tell her that you have a vaginal infection. She should follow up with her health care provider. If you have a female sexual partner, he does not need treatment. Avoid sexual activity until you finish treatment. Drink enough fluid to keep your urine pale yellow. Keep the area around your vagina and rectum clean. Wash the area daily with warm water. Wipe yourself from front to back after using the toilet. If you are breastfeeding, talk to your health care provider about continuing breastfeeding during treatment. Keep all follow-up visits. This is important. How is this prevented? Self-care Do not douche. Wash the outside of your vagina with warm water only. Wear cotton or cotton-lined underwear. Avoid wearing tight pants and pantyhose, especially during the summer. Safe sex Use protection when having sex. This includes: Using condoms. Using dental dams. This is a thin layer of a material made of latex or polyurethane that protects the mouth during oral sex. Limit the number of sexual partners. To help prevent bacterial vaginosis, it is best to have sex with just one partner (monogamous relationship). Make sure you and your sexual partner are tested for STIs. Drugs and alcohol Do not use any products that contain nicotine or tobacco. These products include  cigarettes, chewing tobacco, and vaping devices, such as e-cigarettes. If you need help quitting, ask your health care provider. Do not use drugs. Do not drink alcohol if: Your health care provider tells you not to do this. You are pregnant, may be pregnant, or are planning to become pregnant. If you drink alcohol: Limit how much you have to 0-1 drink a day. Be aware of how much alcohol is in your drink. In the [place] one drink equals one 12 oz bottle of beer (355 mL), one 5 oz glass of wine (148 mL), or one 1 oz glass of hard liquor (44 mL). Where to find more information Centers for Disease Control and Prevention: FootballExhibition.com.br American Sexual Health Association (ASHA): www.ashastd.org U.S. Department of Health and Health and safety inspector, Office on Women's Health: http://hoffman.com/ Contact a health care provider if: Your symptoms do not improve, even after treatment. You have more discharge or pain when urinating. You have a fever or chills. You have pain in your abdomen or pelvis. You have pain during sex. You have vaginal bleeding between menstrual periods. Summary Bacterial vaginosis is a vaginal infection that occurs when the normal balance of bacteria in the vagina changes. It results from an overgrowth of certain bacteria. This condition increases the risk of sexually transmitted infections (STIs). Getting treated can help reduce this risk.  Treatment is very important for pregnant women because this condition can cause babies to be born early (prematurely) or at low birth weight. This condition is treated with antibiotic medicines. These may be given as a pill, a vaginal cream, or a medicine that is put into the vagina (suppository). This information is not intended to replace advice given to you by your health care provider. Make sure you discuss any questions you have with your health care provider. Document Revised: 02/22/2020 Document Reviewed: 02/22/2020 Elsevier Patient Education   2024 Elsevier Inc.   Hypertension, Adult High blood pressure (hypertension) is when the force of blood pumping through the arteries is too strong. The arteries are the blood vessels that carry blood from the heart throughout the body. Hypertension forces the heart to work harder to pump blood and may cause arteries to become narrow or stiff. Untreated or uncontrolled hypertension can lead to a heart attack, heart failure, a stroke, kidney disease, and other problems. A blood pressure reading consists of a higher number over a lower number. Ideally, your blood pressure should be below 120/80. The first ("top") number is called the systolic pressure. It is a measure of the pressure in your arteries as your heart beats. The second ("bottom") number is called the diastolic pressure. It is a measure of the pressure in your arteries as the heart relaxes. What are the causes? The exact cause of this condition is not known. There are some conditions that result in high blood pressure. What increases the risk? Certain factors may make you more likely to develop high blood pressure. Some of these risk factors are under your control, including: Smoking. Not getting enough exercise or physical activity. Being overweight. Having too much fat, sugar, calories, or salt (sodium) in your diet. Drinking too much alcohol. Other risk factors include: Having a personal history of heart disease, diabetes, high cholesterol, or kidney disease. Stress. Having a family history of high blood pressure and high cholesterol. Having obstructive sleep apnea. Age. The risk increases with age. What are the signs or symptoms? High blood pressure may not cause symptoms. Very high blood pressure (hypertensive crisis) may cause: Headache. Fast or irregular heartbeats (palpitations). Shortness of breath. Nosebleed. Nausea and vomiting. Vision changes. Severe chest pain, dizziness, and seizures. How is this diagnosed? This  condition is diagnosed by measuring your blood pressure while you are seated, with your arm resting on a flat surface, your legs uncrossed, and your feet flat on the floor. The cuff of the blood pressure monitor will be placed directly against the skin of your upper arm at the level of your heart. Blood pressure should be measured at least twice using the same arm. Certain conditions can cause a difference in blood pressure between your right and left arms. If you have a high blood pressure reading during one visit or you have normal blood pressure with other risk factors, you may be asked to: Return on a different day to have your blood pressure checked again. Monitor your blood pressure at home for 1 week or longer. If you are diagnosed with hypertension, you may have other blood or imaging tests to help your health care provider understand your overall risk for other conditions. How is this treated? This condition is treated by making healthy lifestyle changes, such as eating healthy foods, exercising more, and reducing your alcohol intake. You may be referred for counseling on a healthy diet and physical activity. Your health care provider may prescribe medicine if lifestyle  changes are not enough to get your blood pressure under control and if: Your systolic blood pressure is above 130. Your diastolic blood pressure is above 80. Your personal target blood pressure may vary depending on your medical conditions, your age, and other factors. Follow these instructions at home: Eating and drinking  Eat a diet that is high in fiber and potassium, and low in sodium, added sugar, and fat. An example of this eating plan is called the DASH diet. DASH stands for Dietary Approaches to Stop Hypertension. To eat this way: Eat plenty of fresh fruits and vegetables. Try to fill one half of your plate at each meal with fruits and vegetables. Eat whole grains, such as whole-wheat pasta, brown rice, or whole-grain  bread. Fill about one fourth of your plate with whole grains. Eat or drink low-fat dairy products, such as skim milk or low-fat yogurt. Avoid fatty cuts of meat, processed or cured meats, and poultry with skin. Fill about one fourth of your plate with lean proteins, such as fish, chicken without skin, beans, eggs, or tofu. Avoid pre-made and processed foods. These tend to be higher in sodium, added sugar, and fat. Reduce your daily sodium intake. Many people with hypertension should eat less than 1,500 mg of sodium a day. Do not drink alcohol if: Your health care provider tells you not to drink. You are pregnant, may be pregnant, or are planning to become pregnant. If you drink alcohol: Limit how much you have to: 0-1 drink a day for women. 0-2 drinks a day for men. Know how much alcohol is in your drink. In the [place].S., one drink equals one 12 oz bottle of beer (355 mL), one 5 oz glass of wine (148 mL), or one 1 oz glass of hard liquor (44 mL). Lifestyle  Work with your health care provider to maintain a healthy body weight or to lose weight. Ask what an ideal weight is for you. Get at least 30 minutes of exercise that causes your heart to beat faster (aerobic exercise) most days of the week. Activities may include walking, swimming, or biking. Include exercise to strengthen your muscles (resistance exercise), such as Pilates or lifting weights, as part of your weekly exercise routine. Try to do these types of exercises for 30 minutes at least 3 days a week. Do not use any products that contain nicotine or tobacco. These products include cigarettes, chewing tobacco, and vaping devices, such as e-cigarettes. If you need help quitting, ask your health care provider. Monitor your blood pressure at home as told by your health care provider. Keep all follow-up visits. This is important. Medicines Take over-the-counter and prescription medicines only as told by your health care provider. Follow  directions carefully. Blood pressure medicines must be taken as prescribed. Do not skip doses of blood pressure medicine. Doing this puts you at risk for problems and can make the medicine less effective. Ask your health care provider about side effects or reactions to medicines that you should watch for. Contact a health care provider if you: Think you are having a reaction to a medicine you are taking. Have headaches that keep coming back (recurring). Feel dizzy. Have swelling in your ankles. Have trouble with your vision. Get help right away if you: Develop a severe headache or confusion. Have unusual weakness or numbness. Feel faint. Have severe pain in your chest or abdomen. Vomit repeatedly. Have trouble breathing. These symptoms may be an emergency. Get help right away. Call 911. Do  not wait to see if the symptoms will go away. Do not drive yourself to the hospital. Summary Hypertension is when the force of blood pumping through your arteries is too strong. If this condition is not controlled, it may put you at risk for serious complications. Your personal target blood pressure may vary depending on your medical conditions, your age, and other factors. For most people, a normal blood pressure is less than 120/80. Hypertension is treated with lifestyle changes, medicines, or a combination of both. Lifestyle changes include losing weight, eating a healthy, low-sodium diet, exercising more, and limiting alcohol. This information is not intended to replace advice given to you by your health care provider. Make sure you discuss any questions you have with your health care provider. Document Revised: 07/01/2021 Document Reviewed: 07/01/2021 Elsevier Patient Education  2024 Elsevier Inc.    If you have been instructed to have an in-person evaluation today at a local Urgent Care facility, please use the link below. It will take you to a list of all of our available [facility], including address, phone number and hours of operation. Please do not delay care.  [facility]  If you or a family member do not have a primary care provider, use the link below to schedule a visit and establish care. When you choose a [facility] primary care physician or advanced practice provider, you gain a long-term partner in health. Find a Primary Care Provider  Learn more about [facility]'s in-office and virtual care options: [facility] - Get Care Now

## 2023-06-19 NOTE — Progress Notes (Signed)
Virtual Visit Consent   Sydney Griffin, you are scheduled for a virtual visit with a [facility] provider today. Just as with appointments in the office, your consent must be obtained to participate. Your consent will be active for this visit and any virtual visit you may have with one of our providers in the next 365 days. If you have a MyChart account, a copy of this consent can be sent to you electronically.  As this is a virtual visit, video technology does not allow for your provider to perform a traditional examination. This may limit your provider's ability to fully assess your condition. If your provider identifies any concerns that need to be evaluated in person or the need to arrange testing (such as labs, EKG, etc.), we will make arrangements to do so. Although advances in technology are sophisticated, we cannot ensure that it will always work on either your end or our end. If the connection with a video visit is poor, the visit may have to be switched to a telephone visit. With either a video or telephone visit, we are not always able to ensure that we have a secure connection.  By engaging in this virtual visit, you consent to the provision of healthcare and authorize for your insurance to be billed (if applicable) for the services provided during this visit. Depending on your insurance coverage, you may receive a charge related to this service.  I need to obtain your verbal consent now. Are you willing to proceed with your visit today? Sydney Griffin has provided verbal consent on 06/19/2023 for a virtual visit (video or telephone). Reed Pandy, [place]  Date: 06/19/2023 12:05 PM  Virtual Visit via Video Note   I, Reed Pandy, connected with  Sydney Griffin  ([id number], [birth year]) on 06/19/23 at 12:00 PM EDT by a video-enabled telemedicine application and verified that I am speaking with the correct person using two identifiers.  Location: Patient: Virtual Visit Location  Patient: Home Provider: Virtual Visit Location Provider: Home Office   I discussed the limitations of evaluation and management by telemedicine and the availability of in person appointments. The patient expressed understanding and agreed to proceed.    History of Present Illness: Sydney Griffin is a 42 y.o. who identifies as a female who was assigned female at birth, and is being seen today for c/o I need my blood pressure medicine refilled and metronidazole gel.  Pt states is waiting to have her company make her an appointment for her PCP. Pt states she is having vaginal symptom.  Pt states she is having discharge and odor. Pt denies nausea, vomiting or fever.  HPI: HPI  Problems:  Patient Active Problem List   Diagnosis Date Noted   History of cesarean section 10/30/2019   IUD (intrauterine device) in place 10/30/2019   AMA (advanced maternal age) multigravida 35+ 06/13/2019   Encounter for supervision of high risk multigravida of advanced maternal age, antepartum 04/11/2019   Chronic hypertension during pregnancy 04/11/2019   Tobacco use complicating pregnancy 04/11/2019   Suboxone maintenance treatment complicating pregnancy, antepartum (HCC) 04/11/2019   Carpal tunnel syndrome 02/22/2013   Tobacco abuse 02/15/2013   Previous cesarean delivery, antepartum 03/27/2012   Morbid obesity (HCC) 03/27/2012    Allergies:  Allergies  Allergen Reactions   Aspirin Shortness Of Breath   Dilaudid [Hydromorphone Hcl] Other (See Comments)    Pt says medication causes her to shake uncontrollably.   Ibuprofen Other (See Comments)    Gi  upset   Tramadol Nausea Only and Other (See Comments)    Causes headache.   Vicodin [Hydrocodone-Acetaminophen] Other (See Comments)    Causes headache.   Medications:  Current Outpatient Medications:    buprenorphine-naloxone (SUBOXONE) 8-2 mg SUBL SL tablet, Place 1 tablet under the tongue daily. Pt states she takes anywhere from 1-3  daily, Disp: ,  Rfl:    ibuprofen (ADVIL) 800 MG tablet, Take 1 tablet (800 mg total) by mouth every 8 (eight) hours as needed for cramping., Disp: 30 tablet, Rfl: 5   metroNIDAZOLE (METROGEL) 0.75 % gel, Insert one applicatorful (5g) of medicine into the vagina once nightly x 5 days, Disp: 25 g, Rfl: 0   triamterene-hydrochlorothiazide (MAXZIDE-25) 37.5-25 MG tablet, Take 1 tablet by mouth daily. ONE TIME REFILL. WILL NEED IN-PERSON APPT WITH NEW PCP OR URGENT CARE, Disp: 30 tablet, Rfl: 0  Observations/Objective: Patient is well-developed, well-nourished in no acute distress.  Resting comfortably at home.  Head is normocephalic, atraumatic.  No labored breathing.  Speech is clear and coherent with logical content.  Patient is alert and oriented at baseline.    Assessment and Plan: 1. HTN (hypertension), benign - triamterene-hydrochlorothiazide (MAXZIDE-25) 37.5-25 MG tablet; Take 1 tablet by mouth daily. ONE TIME REFILL. WILL NEED IN-PERSON APPT WITH NEW PCP OR URGENT CARE  Dispense: 30 tablet; Refill: 0  2. BV (bacterial vaginosis) - metroNIDAZOLE (METROGEL) 0.75 % gel; Insert one applicatorful (5g) of medicine into the vagina once nightly x 5 days  Dispense: 25 g; Refill: 0  -Medications refilled -Advised Pt to follow up with PCP for further refills -Pt verbalized understanding and stated she will need to find a PCP on her own  Follow Up Instructions: I discussed the assessment and treatment plan with the patient. The patient was provided an opportunity to ask questions and all were answered. The patient agreed with the plan and demonstrated an understanding of the instructions.  A copy of instructions were sent to the patient via MyChart unless otherwise noted below.     The patient was advised to call back or seek an in-person evaluation if the symptoms worsen or if the condition fails to improve as anticipated.    Reed Pandy, PA-C

## 2023-06-23 DIAGNOSIS — F331 Major depressive disorder, recurrent, moderate: Secondary | ICD-10-CM | POA: Diagnosis not present

## 2023-06-26 DIAGNOSIS — F331 Major depressive disorder, recurrent, moderate: Secondary | ICD-10-CM | POA: Diagnosis not present

## 2023-06-29 DIAGNOSIS — F331 Major depressive disorder, recurrent, moderate: Secondary | ICD-10-CM | POA: Diagnosis not present

## 2023-07-02 DIAGNOSIS — F331 Major depressive disorder, recurrent, moderate: Secondary | ICD-10-CM | POA: Diagnosis not present

## 2023-07-07 DIAGNOSIS — F331 Major depressive disorder, recurrent, moderate: Secondary | ICD-10-CM | POA: Diagnosis not present

## 2023-07-21 DIAGNOSIS — F331 Major depressive disorder, recurrent, moderate: Secondary | ICD-10-CM | POA: Diagnosis not present

## 2023-07-22 DIAGNOSIS — F331 Major depressive disorder, recurrent, moderate: Secondary | ICD-10-CM | POA: Diagnosis not present

## 2023-07-29 DIAGNOSIS — F331 Major depressive disorder, recurrent, moderate: Secondary | ICD-10-CM | POA: Diagnosis not present

## 2023-08-07 DIAGNOSIS — F331 Major depressive disorder, recurrent, moderate: Secondary | ICD-10-CM | POA: Diagnosis not present

## 2023-08-12 DIAGNOSIS — F331 Major depressive disorder, recurrent, moderate: Secondary | ICD-10-CM | POA: Diagnosis not present

## 2023-08-18 DIAGNOSIS — F331 Major depressive disorder, recurrent, moderate: Secondary | ICD-10-CM | POA: Diagnosis not present

## 2023-08-23 DIAGNOSIS — F331 Major depressive disorder, recurrent, moderate: Secondary | ICD-10-CM | POA: Diagnosis not present

## 2023-08-30 DIAGNOSIS — F331 Major depressive disorder, recurrent, moderate: Secondary | ICD-10-CM | POA: Diagnosis not present

## 2023-09-06 DIAGNOSIS — F331 Major depressive disorder, recurrent, moderate: Secondary | ICD-10-CM | POA: Diagnosis not present

## 2023-09-16 DIAGNOSIS — F331 Major depressive disorder, recurrent, moderate: Secondary | ICD-10-CM | POA: Diagnosis not present

## 2023-09-22 DIAGNOSIS — F331 Major depressive disorder, recurrent, moderate: Secondary | ICD-10-CM | POA: Diagnosis not present

## 2023-10-03 ENCOUNTER — Ambulatory Visit (HOSPITAL_COMMUNITY)
Admission: EM | Admit: 2023-10-03 | Discharge: 2023-10-03 | Disposition: A | Discharge status: Home / Self Care | Payer: 59 | Attending: Physician Assistant | Admitting: Physician Assistant

## 2023-10-03 ENCOUNTER — Encounter (HOSPITAL_COMMUNITY): Payer: Self-pay

## 2023-10-03 ENCOUNTER — Ambulatory Visit (INDEPENDENT_AMBULATORY_CARE_PROVIDER_SITE_OTHER): Payer: 59

## 2023-10-03 DIAGNOSIS — N760 Acute vaginitis: Secondary | ICD-10-CM | POA: Insufficient documentation

## 2023-10-03 DIAGNOSIS — I10 Essential (primary) hypertension: Secondary | ICD-10-CM | POA: Diagnosis not present

## 2023-10-03 DIAGNOSIS — B9689 Other specified bacterial agents as the cause of diseases classified elsewhere: Secondary | ICD-10-CM | POA: Diagnosis not present

## 2023-10-03 DIAGNOSIS — M79645 Pain in left finger(s): Secondary | ICD-10-CM | POA: Diagnosis not present

## 2023-10-03 DIAGNOSIS — Z760 Encounter for issue of repeat prescription: Secondary | ICD-10-CM | POA: Diagnosis not present

## 2023-10-03 DIAGNOSIS — M1812 Unilateral primary osteoarthritis of first carpometacarpal joint, left hand: Secondary | ICD-10-CM | POA: Diagnosis not present

## 2023-10-03 MED ORDER — TRIAMTERENE-HCTZ 37.5-25 MG PO TABS: 1.0000 | ORAL_TABLET | Freq: Every day | ORAL | 0 refills | Status: DC

## 2023-10-03 MED ORDER — METRONIDAZOLE 0.75 % EX GEL: CUTANEOUS | 0 refills | Status: DC

## 2023-10-03 MED ORDER — SULFAMETHOXAZOLE-TRIMETHOPRIM 800-160 MG PO TABS: 1.0000 | ORAL_TABLET | Freq: Two times a day (BID) | ORAL | 0 refills | Status: DC

## 2023-10-03 MED ORDER — SULFAMETHOXAZOLE-TRIMETHOPRIM 800-160 MG PO TABS: 1.0000 | ORAL_TABLET | Freq: Two times a day (BID) | ORAL | 0 refills | Status: AC

## 2023-10-03 NOTE — ED Provider Notes (Signed)
[facility]    CSN: [id number] Arrival date & time: 10/03/23  1323      History   Chief Complaint Chief Complaint  Patient presents with   Hand Pain    Left middle finger pain and discoloration x1 year   Medication Refill   vaginal odor    HPI Sydney Griffin is a 43 y.o. female.   Patient is of left middle finger pain that started about 1 year ago.  She denies new injury or trauma.  She reports some discoloration under the nail and tenderness to palpation to tip of finger.  She denies fever, chills.  Pain is worse with movement. Patient also requesting medication refill, reports she is out of her blood pressure medication.  She takes Maxide. Patient reports she has been having increased vaginal discharge with foul odor that started about 3 days ago.  Denies pelvic pain, fever, chills.  Reports symptoms feel similar to the last time she had BV.    Past Medical History:  Diagnosis Date   Acid reflux    Asthma May 2013   info from [facility] states asthma but pt denies and said she had bronchitis, used an inhlaer while sick and has now lost the inhaler.   Dysrhythmia    rare palpitations   Ectopic pregnancy    Had MTX   Headache(784.0)    Hypertension    Menstrual bleeding problem    Pregnancy complicated by suboxone maintenance, antepartum, unspecified trimester [facility])     Patient Active Problem List   Diagnosis Date Noted   History of cesarean section 10/30/2019   IUD (intrauterine device) in place 10/30/2019   AMA (advanced maternal age) multigravida 35+ 06/13/2019   Encounter for supervision of high risk multigravida of advanced maternal age, antepartum 04/11/2019   Chronic hypertension during pregnancy 04/11/2019   Tobacco use complicating pregnancy 04/11/2019   Suboxone maintenance treatment complicating pregnancy, antepartum (HCC) 04/11/2019   Carpal tunnel syndrome 02/22/2013   Tobacco abuse 02/15/2013   Previous cesarean delivery, antepartum  03/27/2012   Morbid obesity (HCC) 03/27/2012    Past Surgical History:  Procedure Laterality Date   CESAREAN SECTION     CESAREAN SECTION  03/28/2012   Procedure: CESAREAN SECTION;  Surgeon: Antionette Char, MD;  Location: [facility];  Service: Gynecology;  Laterality: N/A;   CESAREAN SECTION N/A 04/20/2013   Procedure: CESAREAN SECTION;  Surgeon: Antionette Char, MD;  Location: [facility];  Service: Obstetrics;  Laterality: N/A;  Repeat Cesarean Section    CESAREAN SECTION N/A 10/30/2019   Procedure: CESAREAN SECTION;  Surgeon: Drumright Bing, MD;  Location: [facility];  Service: Obstetrics;  Laterality: N/A;   DENTAL SURGERY     DILATION AND CURETTAGE OF UTERUS     LAPAROSCOPY      OB History     Gravida  6   Para  4   Term  4   Preterm  0   AB  2   Living  4      SAB      IAB      Ectopic  2   Multiple  0   Live Births  4            Home Medications    Prior to Admission medications   Medication Sig Start Date End Date Taking? Authorizing Provider  buprenorphine-naloxone (SUBOXONE) 8-2 mg SUBL SL tablet Place 1 tablet under the tongue daily. Pt states she takes anywhere from 1-3  daily  Yes [provider]  sulfamethoxazole-trimethoprim (BACTRIM DS) 800-160 MG tablet Take 1 tablet by mouth 2 (two) times daily for 7 days. 10/03/23 10/10/23 Yes Ward, Tylene Fantasia, PA-C  ibuprofen (ADVIL) 800 MG tablet Take 1 tablet (800 mg total) by mouth every 8 (eight) hours as needed for cramping. 11/12/20   Brock Bad, MD  metroNIDAZOLE (METROGEL) 0.75 % gel Insert one applicatorful (5g) of medicine into the vagina once nightly x 5 days 10/03/23   Ward, Tylene Fantasia, PA-C  triamterene-hydrochlorothiazide (MAXZIDE-25) 37.5-25 MG tablet Take 1 tablet by mouth daily. ONE TIME REFILL. WILL NEED IN-PERSON APPT WITH NEW PCP OR URGENT CARE 10/03/23 11/02/23  Ward, Tylene Fantasia, PA-C    Family History Family History  Problem Relation Age of Onset   Hypertension Mother     Diabetes Mother    Diabetes Father    Hyperlipidemia Father    Hypertension Brother    Hypertension Sister    Anesthesia problems Neg Hx    Other Neg Hx     Social History Social History   Tobacco Use   Smoking status: Every Day    Current packs/day: 1.00    Average packs/day: 1 pack/day for 14.0 years (14.0 ttl pk-yrs)    Types: Cigarettes   Smokeless tobacco: Never  Vaping Use   Vaping status: Never Used  Substance Use Topics   Alcohol use: No    Alcohol/week: 0.0 standard drinks of alcohol   Drug use: Yes    Comment: Suboxone      Allergies   Aspirin, Dilaudid [hydromorphone hcl], Ibuprofen, Tramadol, and Vicodin [hydrocodone-acetaminophen]   Review of Systems Review of Systems   Physical Exam Triage Vital Signs ED Triage Vitals  Encounter Vitals Group     BP 10/03/23 1511 125/64     Systolic BP Percentile --      Diastolic BP Percentile --      Pulse Rate 10/03/23 1511 89     Resp 10/03/23 1511 17     Temp 10/03/23 1511 98.5 F (36.9 C)     Temp src --      SpO2 10/03/23 1511 96 %     Weight 10/03/23 1508 260 lb (117.9 kg)     Height 10/03/23 1508 5\' 9"  (1.753 m)     Head Circumference --      Peak Flow --      Pain Score 10/03/23 1508 10     Pain Loc --      Pain Education --      Exclude from Growth Chart --    No data found.  Updated Vital Signs BP 125/64 (BP Location: Left Arm)   Pulse 89   Temp 98.5 F (36.9 C)   Resp 17   Ht 5\' 9"  (1.753 m)   Wt 260 lb (117.9 kg)   LMP 09/12/2023   SpO2 96%   Breastfeeding No   BMI 38.40 kg/m   Visual Acuity Right Eye Distance:   Left Eye Distance:   Bilateral Distance:    Right Eye Near:   Left Eye Near:    Bilateral Near:     Physical Exam   UC Treatments / Results  Labs (all labs ordered are listed, but only abnormal results are displayed) Labs Reviewed  CERVICOVAGINAL ANCILLARY ONLY    EKG   Radiology DG Finger Middle Left Result Date: 10/03/2023 CLINICAL DATA:  Left  middle finger pain for 1 year. Discoloration. No known injury. EXAM: LEFT MIDDLE FINGER 2+V COMPARISON:  None Available. FINDINGS: Normal bone mineralization. Normal alignment of the third finger. Joint spaces are preserved. No acute fracture or dislocation. No radiopaque foreign body. Partial visualization of mild peripheral degenerative spurring at the thumb carpometacarpal joint. IMPRESSION: 1. Normal appearance of the third finger. 2. Partial visualization of mild peripheral degenerative spurring at the thumb carpometacarpal joint. Electronically Signed   By: Neita Garnet M.D.   On: 10/03/2023 16:33    Procedures Procedures (including critical care time)  Medications Ordered in UC Medications - No data to display  Initial Impression / Assessment and Plan / UC Course  I have reviewed the triage vital signs and the nursing notes.  Pertinent labs & imaging results that were available during my care of the patient were reviewed by me and considered in my medical decision making (see chart for details).     Finger pain for one year, imaging negative for fracture.  Given TTP and mild swelling will cover with antibiotic at this time. Advised to follow up hand specialist if no improvement.  Refill of HTN medication sent in.  Metrogel prescribed for BV.  Cervicovaginal self swab in clinic today, will change treatment plan if indicated based on results.  Final Clinical Impressions(s) / UC Diagnoses   Final diagnoses:  Pain of finger of left hand  Medication refill  BV (bacterial vaginosis)     Discharge Instructions      Take antibiotic as prescribed for possible finger infection  Can wear splint for comfort If no improvement follow up with primary care physician or hand specialist  Use metrogel as prescribed.  Will call with test results and change treatment plan if needed.       ED Prescriptions     Medication Sig Dispense Auth. Provider   metroNIDAZOLE (METROGEL) 0.75 % gel  Insert one applicatorful (5g) of medicine into the vagina once nightly x 5 days 25 g Ward, Bahja Bence Z, PA-C   triamterene-hydrochlorothiazide (MAXZIDE-25) 37.5-25 MG tablet Take 1 tablet by mouth daily. ONE TIME REFILL. WILL NEED IN-PERSON APPT WITH NEW PCP OR URGENT CARE 30 tablet Ward, Shanda Bumps Z, PA-C   sulfamethoxazole-trimethoprim (BACTRIM DS) 800-160 MG tablet Take 1 tablet by mouth 2 (two) times daily for 7 days. 14 tablet Ward, Tylene Fantasia, PA-C      PDMP not reviewed this encounter.   Ward, Tylene Fantasia, PA-C 10/03/23 [phone]

## 2023-10-03 NOTE — ED Triage Notes (Signed)
Pt states that she has some left middle finger pain. X1 year. Pt states that her finger is also discolored. Pt denies any known injury.   Pt states that she also needs her blood pressure medication refilled.  Pt states that she also has some vaginal odor and itching. X3 days

## 2023-10-03 NOTE — Discharge Instructions (Addendum)
Take antibiotic as prescribed for possible finger infection  Can wear splint for comfort If no improvement follow up with primary care physician or hand specialist  Use metrogel as prescribed.  Will call with test results and change treatment plan if needed.

## 2023-10-04 ENCOUNTER — Telehealth (HOSPITAL_BASED_OUTPATIENT_CLINIC_OR_DEPARTMENT_OTHER): Payer: Self-pay

## 2023-10-04 LAB — CERVICOVAGINAL ANCILLARY ONLY
Bacterial Vaginitis (gardnerella): POSITIVE — AB
Candida Glabrata: NEGATIVE
Candida Vaginitis: POSITIVE — AB
Chlamydia: NEGATIVE
Comment: NEGATIVE
Comment: NEGATIVE
Comment: NEGATIVE
Comment: NEGATIVE
Comment: NEGATIVE
Comment: NORMAL
Neisseria Gonorrhea: NEGATIVE
Trichomonas: NEGATIVE

## 2023-10-04 MED ORDER — FLUCONAZOLE 150 MG PO TABS: 150.0000 mg | ORAL_TABLET | Freq: Once | ORAL | 0 refills | Status: AC

## 2023-10-04 NOTE — Telephone Encounter (Signed)
Per protocol, pt requires tx with Diflucan.  Rx sent to pharmacy on file.

## 2023-10-20 DIAGNOSIS — F331 Major depressive disorder, recurrent, moderate: Secondary | ICD-10-CM | POA: Diagnosis not present

## 2023-10-31 DIAGNOSIS — F331 Major depressive disorder, recurrent, moderate: Secondary | ICD-10-CM | POA: Diagnosis not present

## 2023-11-01 DIAGNOSIS — F331 Major depressive disorder, recurrent, moderate: Secondary | ICD-10-CM | POA: Diagnosis not present

## 2023-11-07 DIAGNOSIS — F331 Major depressive disorder, recurrent, moderate: Secondary | ICD-10-CM | POA: Diagnosis not present

## 2023-11-08 ENCOUNTER — Ambulatory Visit (INDEPENDENT_AMBULATORY_CARE_PROVIDER_SITE_OTHER)

## 2023-11-08 ENCOUNTER — Other Ambulatory Visit: Payer: Self-pay

## 2023-11-08 ENCOUNTER — Ambulatory Visit (HOSPITAL_COMMUNITY): Admission: EM | Admit: 2023-11-08 | Discharge: 2023-11-08 | Disposition: A | Discharge status: Home / Self Care

## 2023-11-08 ENCOUNTER — Encounter (HOSPITAL_COMMUNITY): Payer: Self-pay | Admitting: Emergency Medicine

## 2023-11-08 DIAGNOSIS — M25521 Pain in right elbow: Secondary | ICD-10-CM

## 2023-11-08 DIAGNOSIS — M25562 Pain in left knee: Secondary | ICD-10-CM | POA: Diagnosis not present

## 2023-11-08 DIAGNOSIS — M545 Low back pain, unspecified: Secondary | ICD-10-CM

## 2023-11-08 DIAGNOSIS — M1712 Unilateral primary osteoarthritis, left knee: Secondary | ICD-10-CM | POA: Diagnosis not present

## 2023-11-08 MED ORDER — PREDNISONE 20 MG PO TABS: 40.0000 mg | ORAL_TABLET | Freq: Every day | ORAL | 0 refills | Status: AC

## 2023-11-08 MED ORDER — CYCLOBENZAPRINE HCL 10 MG PO TABS: 10.0000 mg | ORAL_TABLET | Freq: Two times a day (BID) | ORAL | 0 refills | Status: AC | PRN
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
## 2023-11-08 NOTE — ED Notes (Signed)
 Reviewed work note

## 2023-11-08 NOTE — Discharge Instructions (Addendum)
  Xrays without fracture.   Please take medication as prescribed and follow up with ortho if symptoms persist.   Muscle relaxer may cause drowsiness- use with caution.   Report to ED with any worsening.
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
## 2023-11-09 DIAGNOSIS — F331 Major depressive disorder, recurrent, moderate: Secondary | ICD-10-CM | POA: Diagnosis not present

## 2023-11-11 ENCOUNTER — Encounter (HOSPITAL_COMMUNITY): Payer: Self-pay | Admitting: Physician Assistant

## 2023-11-12 DIAGNOSIS — F331 Major depressive disorder, recurrent, moderate: Secondary | ICD-10-CM | POA: Diagnosis not present

## 2023-11-16 DIAGNOSIS — F331 Major depressive disorder, recurrent, moderate: Secondary | ICD-10-CM | POA: Diagnosis not present

## 2023-11-17 DIAGNOSIS — F331 Major depressive disorder, recurrent, moderate: Secondary | ICD-10-CM | POA: Diagnosis not present

## 2023-11-20 DIAGNOSIS — F331 Major depressive disorder, recurrent, moderate: Secondary | ICD-10-CM | POA: Diagnosis not present

## 2023-11-22 DIAGNOSIS — F331 Major depressive disorder, recurrent, moderate: Secondary | ICD-10-CM | POA: Diagnosis not present

## 2023-11-28 DIAGNOSIS — F331 Major depressive disorder, recurrent, moderate: Secondary | ICD-10-CM | POA: Diagnosis not present

## 2023-12-01 DIAGNOSIS — F331 Major depressive disorder, recurrent, moderate: Secondary | ICD-10-CM | POA: Diagnosis not present

## 2023-12-03 DIAGNOSIS — F331 Major depressive disorder, recurrent, moderate: Secondary | ICD-10-CM | POA: Diagnosis not present

## 2023-12-07 DIAGNOSIS — F331 Major depressive disorder, recurrent, moderate: Secondary | ICD-10-CM | POA: Diagnosis not present

## 2023-12-11 DIAGNOSIS — F331 Major depressive disorder, recurrent, moderate: Secondary | ICD-10-CM | POA: Diagnosis not present

## 2023-12-14 DIAGNOSIS — F331 Major depressive disorder, recurrent, moderate: Secondary | ICD-10-CM | POA: Diagnosis not present

## 2023-12-18 DIAGNOSIS — F331 Major depressive disorder, recurrent, moderate: Secondary | ICD-10-CM | POA: Diagnosis not present

## 2023-12-21 DIAGNOSIS — F331 Major depressive disorder, recurrent, moderate: Secondary | ICD-10-CM | POA: Diagnosis not present

## 2023-12-25 DIAGNOSIS — F331 Major depressive disorder, recurrent, moderate: Secondary | ICD-10-CM | POA: Diagnosis not present

## 2023-12-28 DIAGNOSIS — F331 Major depressive disorder, recurrent, moderate: Secondary | ICD-10-CM | POA: Diagnosis not present

## 2024-01-01 DIAGNOSIS — F331 Major depressive disorder, recurrent, moderate: Secondary | ICD-10-CM | POA: Diagnosis not present

## 2024-01-04 DIAGNOSIS — F331 Major depressive disorder, recurrent, moderate: Secondary | ICD-10-CM | POA: Diagnosis not present

## 2024-01-11 DIAGNOSIS — F331 Major depressive disorder, recurrent, moderate: Secondary | ICD-10-CM | POA: Diagnosis not present

## 2024-01-12 DIAGNOSIS — F331 Major depressive disorder, recurrent, moderate: Secondary | ICD-10-CM | POA: Diagnosis not present

## 2024-01-14 DIAGNOSIS — F331 Major depressive disorder, recurrent, moderate: Secondary | ICD-10-CM | POA: Diagnosis not present

## 2024-01-31 DIAGNOSIS — F331 Major depressive disorder, recurrent, moderate: Secondary | ICD-10-CM | POA: Diagnosis not present

## 2024-02-02 DIAGNOSIS — F331 Major depressive disorder, recurrent, moderate: Secondary | ICD-10-CM | POA: Diagnosis not present

## 2024-02-05 DIAGNOSIS — F331 Major depressive disorder, recurrent, moderate: Secondary | ICD-10-CM | POA: Diagnosis not present

## 2024-02-07 DIAGNOSIS — F331 Major depressive disorder, recurrent, moderate: Secondary | ICD-10-CM | POA: Diagnosis not present

## 2024-02-10 DIAGNOSIS — F331 Major depressive disorder, recurrent, moderate: Secondary | ICD-10-CM | POA: Diagnosis not present

## 2024-02-15 DIAGNOSIS — F331 Major depressive disorder, recurrent, moderate: Secondary | ICD-10-CM | POA: Diagnosis not present

## 2024-02-18 DIAGNOSIS — F331 Major depressive disorder, recurrent, moderate: Secondary | ICD-10-CM | POA: Diagnosis not present

## 2024-02-22 DIAGNOSIS — F331 Major depressive disorder, recurrent, moderate: Secondary | ICD-10-CM | POA: Diagnosis not present

## 2024-02-25 DIAGNOSIS — F331 Major depressive disorder, recurrent, moderate: Secondary | ICD-10-CM | POA: Diagnosis not present

## 2024-02-29 DIAGNOSIS — F331 Major depressive disorder, recurrent, moderate: Secondary | ICD-10-CM | POA: Diagnosis not present

## 2024-03-03 DIAGNOSIS — F331 Major depressive disorder, recurrent, moderate: Secondary | ICD-10-CM | POA: Diagnosis not present

## 2024-03-07 DIAGNOSIS — F331 Major depressive disorder, recurrent, moderate: Secondary | ICD-10-CM | POA: Diagnosis not present

## 2024-03-09 DIAGNOSIS — F331 Major depressive disorder, recurrent, moderate: Secondary | ICD-10-CM | POA: Diagnosis not present

## 2024-03-14 DIAGNOSIS — F331 Major depressive disorder, recurrent, moderate: Secondary | ICD-10-CM | POA: Diagnosis not present

## 2024-03-21 DIAGNOSIS — F331 Major depressive disorder, recurrent, moderate: Secondary | ICD-10-CM | POA: Diagnosis not present

## 2024-03-23 DIAGNOSIS — F331 Major depressive disorder, recurrent, moderate: Secondary | ICD-10-CM | POA: Diagnosis not present

## 2024-03-28 DIAGNOSIS — F331 Major depressive disorder, recurrent, moderate: Secondary | ICD-10-CM | POA: Diagnosis not present

## 2024-03-30 DIAGNOSIS — F331 Major depressive disorder, recurrent, moderate: Secondary | ICD-10-CM | POA: Diagnosis not present

## 2024-04-04 DIAGNOSIS — F331 Major depressive disorder, recurrent, moderate: Secondary | ICD-10-CM | POA: Diagnosis not present

## 2024-04-06 DIAGNOSIS — F331 Major depressive disorder, recurrent, moderate: Secondary | ICD-10-CM | POA: Diagnosis not present

## 2024-04-11 DIAGNOSIS — F331 Major depressive disorder, recurrent, moderate: Secondary | ICD-10-CM | POA: Diagnosis not present

## 2024-04-13 DIAGNOSIS — F331 Major depressive disorder, recurrent, moderate: Secondary | ICD-10-CM | POA: Diagnosis not present

## 2024-04-24 DIAGNOSIS — F331 Major depressive disorder, recurrent, moderate: Secondary | ICD-10-CM | POA: Diagnosis not present

## 2024-04-25 ENCOUNTER — Telehealth: Admitting: Nurse Practitioner

## 2024-04-25 DIAGNOSIS — G4489 Other headache syndrome: Secondary | ICD-10-CM | POA: Diagnosis not present

## 2024-04-25 DIAGNOSIS — I10 Essential (primary) hypertension: Secondary | ICD-10-CM

## 2024-04-25 DIAGNOSIS — B9689 Other specified bacterial agents as the cause of diseases classified elsewhere: Secondary | ICD-10-CM | POA: Diagnosis not present

## 2024-04-25 DIAGNOSIS — N760 Acute vaginitis: Secondary | ICD-10-CM | POA: Diagnosis not present

## 2024-04-25 MED ORDER — ACETAMINOPHEN-CAFFEINE 500-65 MG PO TABS: 1.0000 | ORAL_TABLET | Freq: Four times a day (QID) | ORAL | 0 refills | Status: AC | PRN

## 2024-04-25 MED ORDER — TRIAMTERENE-HCTZ 37.5-25 MG PO TABS: 1.0000 | ORAL_TABLET | Freq: Every day | ORAL | 0 refills | Status: DC

## 2024-04-25 MED ORDER — METRONIDAZOLE 0.75 % EX GEL: CUTANEOUS | 0 refills | Status: DC

## 2024-04-25 NOTE — Progress Notes (Signed)
 Virtual Visit Consent   Sydney Griffin, you are scheduled for a virtual visit with a [facility] provider today. Just as with appointments in the office, your consent must be obtained to participate. Your consent will be active for this visit and any virtual visit you may have with one of our providers in the next 365 days. If you have a MyChart account, a copy of this consent can be sent to you electronically.  As this is a virtual visit, video technology does not allow for your provider to perform a traditional examination. This may limit your provider's ability to fully assess your condition. If your provider identifies any concerns that need to be evaluated in person or the need to arrange testing (such as labs, EKG, etc.), we will make arrangements to do so. Although advances in technology are sophisticated, we cannot ensure that it will always work on either your end or our end. If the connection with a video visit is poor, the visit may have to be switched to a telephone visit. With either a video or telephone visit, we are not always able to ensure that we have a secure connection.  By engaging in this virtual visit, you consent to the provision of healthcare and authorize for your insurance to be billed (if applicable) for the services provided during this visit. Depending on your insurance coverage, you may receive a charge related to this service.  I need to obtain your verbal consent now. Are you willing to proceed with your visit today? Sydney Griffin has provided verbal consent on 04/25/2024 for a virtual visit (video or telephone). Lauraine Kitty, FNP  Date: 04/25/2024 6:00 PM   Virtual Visit via Video Note   I, Lauraine Kitty, connected with  Sydney Griffin  ([id number], 04-Jan-1981) on 04/25/24 at  6:00 PM EDT by a video-enabled telemedicine application and verified that I am speaking with the correct person using two identifiers.  Location: Patient: Virtual Visit Location  Patient: Home Provider: Virtual Visit Location Provider: Home Office   I discussed the limitations of evaluation and management by telemedicine and the availability of in person appointments. The patient expressed understanding and agreed to proceed.    History of Present Illness: Sydney Griffin is a 43 y.o. who identifies as a female who was assigned female at birth, and is being seen today with requested assistance with medication refills   She has an IUD and feels that since she has had that she has had recurrent BV   She ran out of her blood pressure medication today  She has been taking 1/2 pills to make it last while she was waiting to get a primary care provider  She is getting [facility] where she is getting current suboxone  treatment   Requesting a refill on her BP medication and metro gel until she has her PCP appointment   Also requesting Fioricet  for headache    Problems:  Patient Active Problem List   Diagnosis Date Noted   History of cesarean section 10/30/2019   IUD (intrauterine device) in place 10/30/2019   AMA (advanced maternal age) multigravida 35+ 06/13/2019   Encounter for supervision of high risk multigravida of advanced maternal age, antepartum 04/11/2019   Chronic hypertension during pregnancy 04/11/2019   Tobacco use complicating pregnancy 04/11/2019   Suboxone  maintenance treatment complicating pregnancy, antepartum (HCC) 04/11/2019   Carpal tunnel syndrome 02/22/2013   Tobacco abuse 02/15/2013   Previous cesarean delivery, antepartum 03/27/2012  Morbid obesity (HCC) 03/27/2012    Allergies:  Allergies  Allergen Reactions   Aspirin Shortness Of Breath   Dilaudid  [Hydromorphone  Hcl] Other (See Comments)    Pt says medication causes her to shake uncontrollably.   Ibuprofen  Other (See Comments)    Gi upset   Tramadol Nausea Only and Other (See Comments)    Causes headache.   Vicodin [Hydrocodone -Acetaminophen ] Other (See Comments)     Causes headache.   Medications:  Current Outpatient Medications:    buprenorphine -naloxone  (SUBOXONE ) 8-2 mg SUBL SL tablet, Place 1 tablet under the tongue daily. Pt states she takes anywhere from 1-3  daily, Disp: , Rfl:    cyclobenzaprine  (FLEXERIL ) 10 MG tablet, Take 1 tablet (10 mg total) by mouth 2 (two) times daily as needed for muscle spasms., Disp: 20 tablet, Rfl: 0   ibuprofen  (ADVIL ) 800 MG tablet, Take 1 tablet (800 mg total) by mouth every 8 (eight) hours as needed for cramping., Disp: 30 tablet, Rfl: 5   metroNIDAZOLE  (METROGEL ) 0.75 % gel, Insert one applicatorful (5g) of medicine into the vagina once nightly x 5 days (Patient not taking: Reported on 11/08/2023), Disp: 25 g, Rfl: 0   triamterene -hydrochlorothiazide  (MAXZIDE -25) 37.5-25 MG tablet, Take 1 tablet by mouth daily. ONE TIME REFILL. WILL NEED IN-PERSON APPT WITH NEW PCP OR URGENT CARE, Disp: 30 tablet, Rfl: 0  Observations/Objective: Patient is well-developed, well-nourished in no acute distress.  Resting comfortably  at home.  Head is normocephalic, atraumatic.  No labored breathing.  Speech is clear and coherent with logical content.  Patient is alert and oriented at baseline.    Assessment and Plan:  1. BV (bacterial vaginosis)  - metroNIDAZOLE  (METROGEL ) 0.75 % gel; Insert one applicatorful (5g) of medicine into the vagina once nightly x 5 days  Dispense: 25 g; Refill: 0  2. HTN (hypertension), benign  - triamterene -hydrochlorothiazide  (MAXZIDE -25) 37.5-25 MG tablet; Take 1 tablet by mouth daily.  Dispense: 30 tablet; Refill: 0   Explained that we are not able to refill   Will send Excedrin patient states that she can take acetaminophen  denies allergy    Follow Up Instructions: I discussed the assessment and treatment plan with the patient. The patient was provided an opportunity to ask questions and all were answered. The patient agreed with the plan and demonstrated an understanding of the  instructions.  A copy of instructions were sent to the patient via MyChart unless otherwise noted below.     The patient was advised to call back or seek an in-person evaluation if the symptoms worsen or if the condition fails to improve as anticipated.    Lauraine Kitty, FNP

## 2024-04-27 DIAGNOSIS — F331 Major depressive disorder, recurrent, moderate: Secondary | ICD-10-CM | POA: Diagnosis not present

## 2024-05-01 DIAGNOSIS — F331 Major depressive disorder, recurrent, moderate: Secondary | ICD-10-CM | POA: Diagnosis not present

## 2024-05-04 DIAGNOSIS — F331 Major depressive disorder, recurrent, moderate: Secondary | ICD-10-CM | POA: Diagnosis not present

## 2024-05-08 DIAGNOSIS — F331 Major depressive disorder, recurrent, moderate: Secondary | ICD-10-CM | POA: Diagnosis not present

## 2024-05-11 DIAGNOSIS — F331 Major depressive disorder, recurrent, moderate: Secondary | ICD-10-CM | POA: Diagnosis not present

## 2024-05-15 DIAGNOSIS — F331 Major depressive disorder, recurrent, moderate: Secondary | ICD-10-CM | POA: Diagnosis not present

## 2024-05-18 DIAGNOSIS — F331 Major depressive disorder, recurrent, moderate: Secondary | ICD-10-CM | POA: Diagnosis not present

## 2024-05-22 DIAGNOSIS — F331 Major depressive disorder, recurrent, moderate: Secondary | ICD-10-CM | POA: Diagnosis not present

## 2024-05-25 DIAGNOSIS — F331 Major depressive disorder, recurrent, moderate: Secondary | ICD-10-CM | POA: Diagnosis not present

## 2024-05-29 DIAGNOSIS — F331 Major depressive disorder, recurrent, moderate: Secondary | ICD-10-CM | POA: Diagnosis not present

## 2024-06-01 DIAGNOSIS — F331 Major depressive disorder, recurrent, moderate: Secondary | ICD-10-CM | POA: Diagnosis not present

## 2024-06-28 ENCOUNTER — Telehealth

## 2024-06-28 DIAGNOSIS — B9689 Other specified bacterial agents as the cause of diseases classified elsewhere: Secondary | ICD-10-CM

## 2024-06-28 DIAGNOSIS — G43809 Other migraine, not intractable, without status migrainosus: Secondary | ICD-10-CM

## 2024-06-28 DIAGNOSIS — N760 Acute vaginitis: Secondary | ICD-10-CM | POA: Diagnosis not present

## 2024-06-28 DIAGNOSIS — Z760 Encounter for issue of repeat prescription: Secondary | ICD-10-CM | POA: Diagnosis not present

## 2024-06-28 DIAGNOSIS — I10 Essential (primary) hypertension: Secondary | ICD-10-CM | POA: Diagnosis not present

## 2024-06-28 MED ORDER — SUMATRIPTAN SUCCINATE 50 MG PO TABS: 50.0000 mg | ORAL_TABLET | Freq: Once | ORAL | 0 refills | Status: AC

## 2024-06-28 MED ORDER — TRIAMTERENE-HCTZ 37.5-25 MG PO TABS: 1.0000 | ORAL_TABLET | Freq: Every day | ORAL | 0 refills | Status: AC

## 2024-06-28 MED ORDER — METRONIDAZOLE 0.75 % VA GEL: 1.0000 | Freq: Every day | VAGINAL | 0 refills | Status: AC

## 2024-06-28 NOTE — Patient Instructions (Signed)
 Sydney Griffin, thank you for joining [name], PA-C for today's virtual visit.  While this provider is not your primary care provider (PCP), if your PCP is located in our provider database this encounter information will be shared with them immediately following your visit.   A [facility] MyChart account gives you access to today's visit and all your visits, tests, and labs performed at [facility]  click here if you don't have a [name] account or go to [email or web address]  Consent: (Patient) Sydney Griffin provided verbal consent for this virtual visit at the beginning of the encounter.  Current Medications:  Current Outpatient Medications:    acetaminophen -caffeine  (EXCEDRIN TENSION HEADACHE) 500-65 MG TABS per tablet, Take 1 tablet by mouth every 6 (six) hours as needed (headache)., Disp: 60 tablet, Rfl: 0   buprenorphine -naloxone  (SUBOXONE ) 8-2 mg SUBL SL tablet, Place 1 tablet under the tongue daily. Pt states she takes anywhere from 1-3  daily, Disp: , Rfl:    cyclobenzaprine  (FLEXERIL ) 10 MG tablet, Take 1 tablet (10 mg total) by mouth 2 (two) times daily as needed for muscle spasms., Disp: 20 tablet, Rfl: 0   ibuprofen  (ADVIL ) 800 MG tablet, Take 1 tablet (800 mg total) by mouth every 8 (eight) hours as needed for cramping., Disp: 30 tablet, Rfl: 5   metroNIDAZOLE  (METROGEL ) 0.75 % gel, Insert one applicatorful (5g) of medicine into the vagina once nightly x 5 days, Disp: 25 g, Rfl: 0   triamterene -hydrochlorothiazide  (MAXZIDE -25) 37.5-25 MG tablet, Take 1 tablet by mouth daily., Disp: 30 tablet, Rfl: 0   Medications ordered in this encounter:  No orders of the defined types were placed in this encounter.    *If you need refills on other medications prior to your next appointment, please contact your pharmacy*  Follow-Up: Call back or seek an in-person evaluation if the symptoms worsen or if the condition fails to improve as  anticipated.  [facility] [phone]  Other Instructions Please make sure to follow-up with your new PCP as scheduled for any further medication refills.  Take the Metrogel  as directed for BV.  Vaginal Infection (Bacterial Vaginosis): What to Know  Bacterial vaginosis is an infection of the vagina. It happens when the balance of normal germs (bacteria) in the vagina changes. If you don't get treated, it can make it easier for you to get other infections from sex. These are called sexually transmitted infections (STIs). If you're pregnant, you need to get treated right away. This infection can cause a baby to be born early or at a low birth weight. What are the causes? This infection happens when too many harmful germs grow in the vagina. You can't get this infection from toilet seats, bedsheets, swimming pools, or things that touch your vagina. What increases the risk? Having sex with a new person or more than one person. Having sex without protection. Douching. Having an intrauterine device (IUD). Smoking. Using drugs or drinking alcohol. These can lead you to do risky things. Taking certain antibiotics. Being pregnant. What are the signs or symptoms? Some females have no symptoms. Symptoms may include: A gray or white discharge from your vagina. It can be watery or foamy. A fishy smell. This can happen after sex or during your menstrual period. Itching in and around your vagina. Burning or pain when you pee. How is this treated? This infection is treated with antibiotics. These may be given to you as: A pill. A cream for  your vagina. A medicine that you put into your vagina (suppository). If the infection comes back, you may need more antibiotics. Follow these instructions at home: Medicines Take your medicines as told. Take or use your antibiotics as told. Do not stop using them even if you start to feel better. General instructions If the person you have  sex with is a female, tell her that you have this infection. She will need to follow up with her doctor. Female partners don't need to be treated. Do not have sex until you finish treatment. Drink more fluids as told. Keep your vagina and butt clean. Wash these areas with warm water  each day. Wipe from front to back after you poop. If you're breastfeeding a baby, talk to your doctor if you should keep doing so during treatment. How is this prevented? Self-care Do not douche. Do not use deodorant sprays on your vagina. Wear cotton underwear. Do not wear tight pants and pantyhose, especially in the summer. Safe sex Use condoms the correct way and every time you have sex. Use dental dams to protect yourself during oral sex. Limit how many people you have sex with. Get tested for STIs. The person you have sex with should also get tested. Drugs and alcohol Do not smoke, vape, or use nicotine  or tobacco. Do not use drugs. Limit the amount of alcohol you drink because it can lead you to do risky things. Where to find more information To learn more: Go to TonerPromos.no. Click Health Topics A-Z. Type bacterial vaginosis in the search bar. American Sexual Health Association (ASHA): ashasexualhealth.org U.S. Department of Health and [employer], Office on Women's Health: TravelLesson.ca Contact a doctor if: Your symptoms don't get better, even after treatment. You have more discharge or pain when you pee. You have a fever or chills. You have pain in your belly or in the area between your hips. You have pain during sex. You bleed from your vagina between menstrual periods. This information is not intended to replace advice given to you by your health care provider. Make sure you discuss any questions you have with your health care provider. Document Revised: 02/10/2023 Document Reviewed: 02/10/2023 Elsevier Patient Education  2024 Elsevier Inc.   If you have been instructed to have an  in-person evaluation today at a local Urgent Care facility, please use the link below. It will take you to a list of all of our available [facility], including address, phone number and hours of operation. Please do not delay care.  [facility]  If you or a family member do not have a primary care provider, use the link below to schedule a visit and establish care. When you choose a [facility] primary care physician or advanced practice provider, you gain a long-term partner in health. Find a Primary Care Provider  Learn more about [facility]'s in-office and virtual care options: [facility] - Get Care Now

## 2024-06-28 NOTE — Progress Notes (Signed)
 Virtual Visit Consent   Sydney Griffin, you are scheduled for a virtual visit with a [facility] provider today. Just as with appointments in the office, your consent must be obtained to participate. Your consent will be active for this visit and any virtual visit you may have with one of our providers in the next 365 days. If you have a MyChart account, a copy of this consent can be sent to you electronically.  As this is a virtual visit, video technology does not allow for your provider to perform a traditional examination. This may limit your provider's ability to fully assess your condition. If your provider identifies any concerns that need to be evaluated in person or the need to arrange testing (such as labs, EKG, etc.), we will make arrangements to do so. Although advances in technology are sophisticated, we cannot ensure that it will always work on either your end or our end. If the connection with a video visit is poor, the visit may have to be switched to a telephone visit. With either a video or telephone visit, we are not always able to ensure that we have a secure connection.  By engaging in this virtual visit, you consent to the provision of healthcare and authorize for your insurance to be billed (if applicable) for the services provided during this visit. Depending on your insurance coverage, you may receive a charge related to this service.  I need to obtain your verbal consent now. Are you willing to proceed with your visit today? Sydney Griffin has provided verbal consent on 06/28/2024 for a virtual visit (video or telephone). Sydney Griffin, [place]  Date: 06/28/2024 10:52 AM   Virtual Visit via Video Note   I, Sydney Griffin, connected with  Sydney Griffin  ([id number], April 29, 1981) on 06/28/24 at 10:00 AM EDT by a video-enabled telemedicine application and verified that I am speaking with the correct person using two identifiers.  Location: Patient: Virtual  Visit Location Patient: Home Provider: Virtual Visit Location Provider: Home Office   I discussed the limitations of evaluation and management by telemedicine and the availability of in person appointments. The patient expressed understanding and agreed to proceed.    History of Present Illness: Sydney Griffin is a 43 y.o. who identifies as a female who was assigned female at birth, and is being seen today for multiple complaints.  Patient is requesting one last refill of her BP medication, Maxzide  37.5-25 mg, as she is going to be out of medication prior to her new PCP appointment on 11/18 at [facility].Patient denies chest pain, palpitations, lightheadedness, dizziness, vision changes or frequent headaches.  BP Readings from Last 3 Encounters:  11/08/23 118/74  10/03/23 125/64  11/12/20 122/78   Patient also concerned about bacterial vaginosis. Notes prior history, every since having IUD placed. Current symptoms starting last week with odor and discharge at the end of her menstrual cycle. Denies concern for STI. IUD still in place.  Patient also asking for an acute treatment for her migraines. Previously on Fiorinal which she knows we cannot prescribe. Excedrin given helps with severity but does not abort her headaches. Denies any new or differing symptoms from her history of migraines.   HPI: HPI  Problems:  Patient Active Problem List   Diagnosis Date Noted   History of cesarean section 10/30/2019   IUD (intrauterine device) in place 10/30/2019   AMA (advanced maternal age) multigravida 35+ 06/13/2019   Encounter for  supervision of high risk multigravida of advanced maternal age, antepartum 04/11/2019   Chronic hypertension during pregnancy 04/11/2019   Tobacco use complicating pregnancy 04/11/2019   Suboxone  maintenance treatment complicating pregnancy, antepartum (HCC) 04/11/2019   Carpal tunnel syndrome 02/22/2013   Tobacco abuse 02/15/2013   Previous  cesarean delivery, antepartum 03/27/2012   Morbid obesity (HCC) 03/27/2012    Allergies:  Allergies  Allergen Reactions   Aspirin Shortness Of Breath   Dilaudid  [Hydromorphone  Hcl] Other (See Comments)    Pt says medication causes her to shake uncontrollably.   Ibuprofen  Other (See Comments)    Gi upset   Tramadol Nausea Only and Other (See Comments)    Causes headache.   Vicodin [Hydrocodone -Acetaminophen ] Other (See Comments)    Causes headache.   Medications:  Current Outpatient Medications:    metroNIDAZOLE  (METROGEL ) 0.75 % vaginal gel, Place 1 Applicatorful vaginally at bedtime., Disp: 70 g, Rfl: 0   SUMAtriptan (IMITREX) 50 MG tablet, Take 1 tablet (50 mg total) by mouth once for 1 dose. May repeat in 2 hours if headache persists or recurs., Disp: 10 tablet, Rfl: 0   acetaminophen -caffeine  (EXCEDRIN TENSION HEADACHE) 500-65 MG TABS per tablet, Take 1 tablet by mouth every 6 (six) hours as needed (headache)., Disp: 60 tablet, Rfl: 0   buprenorphine -naloxone  (SUBOXONE ) 8-2 mg SUBL SL tablet, Place 1 tablet under the tongue daily. Pt states she takes anywhere from 1-3  daily, Disp: , Rfl:    cyclobenzaprine  (FLEXERIL ) 10 MG tablet, Take 1 tablet (10 mg total) by mouth 2 (two) times daily as needed for muscle spasms., Disp: 20 tablet, Rfl: 0   ibuprofen  (ADVIL ) 800 MG tablet, Take 1 tablet (800 mg total) by mouth every 8 (eight) hours as needed for cramping., Disp: 30 tablet, Rfl: 5   triamterene -hydrochlorothiazide  (MAXZIDE -25) 37.5-25 MG tablet, Take 1 tablet by mouth daily. No further refills for our department. Must see PCP., Disp: 30 tablet, Rfl: 0  Observations/Objective: Patient is well-developed, well-nourished in no acute distress.  Resting comfortably at home.  Head is normocephalic, atraumatic.  No labored breathing. Speech is clear and coherent with logical content.  Patient is alert and oriented at baseline.   Assessment and Plan: 1. Other migraine without status  migrainosus, not intractable (Primary) - SUMAtriptan (IMITREX) 50 MG tablet; Take 1 tablet (50 mg total) by mouth once for 1 dose. May repeat in 2 hours if headache persists or recurs.  Dispense: 10 tablet; Refill: 0  Supportive measures and OTC medications reviewed. Will send in trial of Imitrex for abortive therapy when needed. Follow-up with new PCP as scheduled.  2. Bacterial vaginosis - metroNIDAZOLE  (METROGEL ) 0.75 % vaginal gel; Place 1 Applicatorful vaginally at bedtime.  Dispense: 70 g; Refill: 0  Metrogel  per orders.  3. Encounter for medication refill 4. HTN (hypertension), benign - triamterene -hydrochlorothiazide  (MAXZIDE -25) 37.5-25 MG tablet; Take 1 tablet by mouth daily. No further refills for our department. Must see PCP.  Dispense: 30 tablet; Refill: 0  One-time refill provided. She is aware no further refills will be given from our department and must follow-up with new PCP as scheduled.   Follow Up Instructions: I discussed the assessment and treatment plan with the patient. The patient was provided an opportunity to ask questions and all were answered. The patient agreed with the plan and demonstrated an understanding of the instructions.  A copy of instructions were sent to the patient via MyChart unless otherwise noted below.   The patient was advised to call back  or seek an in-person evaluation if the symptoms worsen or if the condition fails to improve as anticipated.    Sydney Velma Lunger, PA-C

## 2024-07-11 DIAGNOSIS — F331 Major depressive disorder, recurrent, moderate: Secondary | ICD-10-CM | POA: Diagnosis not present

## 2024-07-14 DIAGNOSIS — F331 Major depressive disorder, recurrent, moderate: Secondary | ICD-10-CM | POA: Diagnosis not present

## 2024-07-25 ENCOUNTER — Ambulatory Visit: Admitting: Family

## 2024-08-01 DIAGNOSIS — F331 Major depressive disorder, recurrent, moderate: Secondary | ICD-10-CM | POA: Diagnosis not present

## 2024-08-04 DIAGNOSIS — F331 Major depressive disorder, recurrent, moderate: Secondary | ICD-10-CM | POA: Diagnosis not present

## 2024-08-18 DIAGNOSIS — F331 Major depressive disorder, recurrent, moderate: Secondary | ICD-10-CM | POA: Diagnosis not present

## 2024-08-20 DIAGNOSIS — F331 Major depressive disorder, recurrent, moderate: Secondary | ICD-10-CM | POA: Diagnosis not present

## 2024-08-21 DIAGNOSIS — F331 Major depressive disorder, recurrent, moderate: Secondary | ICD-10-CM | POA: Diagnosis not present

## 2024-08-27 DIAGNOSIS — F331 Major depressive disorder, recurrent, moderate: Secondary | ICD-10-CM | POA: Diagnosis not present

## 2024-08-29 DIAGNOSIS — F331 Major depressive disorder, recurrent, moderate: Secondary | ICD-10-CM | POA: Diagnosis not present

## 2024-09-01 DIAGNOSIS — F331 Major depressive disorder, recurrent, moderate: Secondary | ICD-10-CM | POA: Diagnosis not present

## 2024-09-08 ENCOUNTER — Ambulatory Visit: Admitting: Family

## 2024-10-16 ENCOUNTER — Ambulatory Visit: Admitting: Family
